# Patient Record
Sex: Female | Born: 1980 | Race: White | Hispanic: No | Marital: Married | Smoking: Never smoker
Health system: Southern US, Community
[De-identification: ages and names within clinical notes are randomized; demographics above are authoritative.]

## PROBLEM LIST (undated history)

## (undated) ENCOUNTER — Emergency Department (HOSPITAL_BASED_OUTPATIENT_CLINIC_OR_DEPARTMENT_OTHER): Admission: EM | Payer: BC Managed Care – PPO | Source: Home / Self Care

## (undated) DIAGNOSIS — Z9109 Other allergy status, other than to drugs and biological substances: Secondary | ICD-10-CM

## (undated) DIAGNOSIS — E119 Type 2 diabetes mellitus without complications: Secondary | ICD-10-CM

## (undated) DIAGNOSIS — K219 Gastro-esophageal reflux disease without esophagitis: Secondary | ICD-10-CM

## (undated) DIAGNOSIS — I10 Essential (primary) hypertension: Secondary | ICD-10-CM

## (undated) DIAGNOSIS — F411 Generalized anxiety disorder: Secondary | ICD-10-CM

## (undated) DIAGNOSIS — T7840XA Allergy, unspecified, initial encounter: Secondary | ICD-10-CM

## (undated) HISTORY — DX: Type 2 diabetes mellitus without complications: E11.9

## (undated) HISTORY — DX: Essential (primary) hypertension: I10

## (undated) HISTORY — DX: Gastro-esophageal reflux disease without esophagitis: K21.9

## (undated) HISTORY — DX: Allergy, unspecified, initial encounter: T78.40XA

## (undated) HISTORY — DX: Other allergy status, other than to drugs and biological substances: Z91.09

---
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
## 2005-04-05 HISTORY — PX: FRACTURE SURGERY: SHX138

## 2010-04-09 ENCOUNTER — Ambulatory Visit (HOSPITAL_COMMUNITY)
Admission: RE | Admit: 2010-04-09 | Discharge: 2010-04-09 | Payer: Self-pay | Source: Home / Self Care | Attending: Rheumatology | Admitting: Rheumatology

## 2010-10-01 ENCOUNTER — Ambulatory Visit (INDEPENDENT_AMBULATORY_CARE_PROVIDER_SITE_OTHER): Payer: BC Managed Care – PPO | Admitting: Family Medicine

## 2010-10-01 ENCOUNTER — Encounter: Payer: Self-pay | Admitting: Family Medicine

## 2010-10-01 DIAGNOSIS — M25561 Pain in right knee: Secondary | ICD-10-CM

## 2010-10-01 DIAGNOSIS — M25569 Pain in unspecified knee: Secondary | ICD-10-CM

## 2010-10-01 DIAGNOSIS — R269 Unspecified abnormalities of gait and mobility: Secondary | ICD-10-CM | POA: Insufficient documentation

## 2010-10-01 NOTE — Assessment & Plan Note (Signed)
Right knee pain secondary to patellofemoral syndrome - Continue to use chopat strap with activity - Instructed on HEP to improve quad/VMO strength - Sports insoles given to correct pes planus & over pronation which should help with knee pain - OTC NSAIDs prn - f/u prn

## 2010-10-01 NOTE — Assessment & Plan Note (Addendum)
Pes planus with overpronation likely contributing to knee pain - Fitted with sports insoles to help correct gait - f/u prn

## 2010-10-01 NOTE — Patient Instructions (Signed)
1) You have patello-femoral syndrome causing your knee pain. 2) Wear strap with activity & zumba 3) Wear new inserts in your shoes for added support 4) Use aleve 2 tabs twice a day with food 5) Do knee exercises on handout once a day 6) Follow-up as needed

## 2010-10-01 NOTE — Progress Notes (Signed)
  Subjective:    Patient ID: Amy Ball, female    DOB: Jun 01, 1980, 30 y.o.   MRN: [id number]  HPI 29yo female to office for evaluation of R knee pain.  Pain started 50-months ago, denies injury or trauma.  Pain along anterior knee & peri-patellar.  Worse with deep squats & stairs.  Intermittent swelling, no clicking, catching, locking, or instability.  Has been wearing OTC chopat strap which is helpful.  Using aleve prn which is helpful.  Is doing Zumba which does aggrevate pain.  Hx of Rt ankle ORIF 5-6 years ago for ankle fx.  No previous knee problems.  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  Rt knee with mild TTP along medial & lateral patellar facets, (+)patellar grind, no ligamentous laxity, neg McMurray, VMO weakness.  Lt knee without pain, swelling, tenderness, or weakness. - Foot/Ankles: Ankles FROM bilaterally without pain, weakness, or instability.  Bilateral pes planus with over pronation, normal posterior tib function - Gait: no leg length difference, increased dynamic over pronation NEURO: sensation intact to light touch VASC: pulses +2/4 DP & PT b/l  MKS U/S: Rt knee with normal appearing QT, PT, medial/lateral meniscus.  Normal appearing patella.  No effusion.  Images saved.       Assessment & Plan:

## 2015-12-19 ENCOUNTER — Ambulatory Visit: Payer: BC Managed Care – PPO | Admitting: Nutrition

## 2016-02-12 ENCOUNTER — Ambulatory Visit: Payer: BC Managed Care – PPO | Admitting: Nutrition

## 2016-03-08 ENCOUNTER — Ambulatory Visit: Payer: BC Managed Care – PPO | Admitting: Nutrition

## 2016-04-12 ENCOUNTER — Ambulatory Visit: Payer: BC Managed Care – PPO | Admitting: Nutrition

## 2016-04-20 ENCOUNTER — Ambulatory Visit: Payer: BC Managed Care – PPO | Admitting: Nutrition

## 2017-01-24 ENCOUNTER — Ambulatory Visit: Payer: BC Managed Care – PPO | Admitting: Family Medicine

## 2017-01-27 ENCOUNTER — Ambulatory Visit: Payer: BC Managed Care – PPO | Admitting: Family Medicine

## 2017-02-15 ENCOUNTER — Encounter: Payer: Self-pay | Admitting: Family Medicine

## 2017-02-15 ENCOUNTER — Ambulatory Visit (INDEPENDENT_AMBULATORY_CARE_PROVIDER_SITE_OTHER): Payer: BC Managed Care – PPO | Admitting: Family Medicine

## 2017-02-15 ENCOUNTER — Other Ambulatory Visit: Payer: Self-pay

## 2017-02-15 VITALS — BP 124/78 | HR 92 | Temp 98.2°F | Resp 16 | Ht 65.0 in | Wt 245.1 lb

## 2017-02-15 DIAGNOSIS — E119 Type 2 diabetes mellitus without complications: Secondary | ICD-10-CM | POA: Insufficient documentation

## 2017-02-15 DIAGNOSIS — L409 Psoriasis, unspecified: Secondary | ICD-10-CM | POA: Diagnosis not present

## 2017-02-15 DIAGNOSIS — I152 Hypertension secondary to endocrine disorders: Secondary | ICD-10-CM | POA: Insufficient documentation

## 2017-02-15 DIAGNOSIS — E1159 Type 2 diabetes mellitus with other circulatory complications: Secondary | ICD-10-CM | POA: Insufficient documentation

## 2017-02-15 DIAGNOSIS — Z9109 Other allergy status, other than to drugs and biological substances: Secondary | ICD-10-CM | POA: Diagnosis not present

## 2017-02-15 DIAGNOSIS — B372 Candidiasis of skin and nail: Secondary | ICD-10-CM

## 2017-02-15 DIAGNOSIS — I10 Essential (primary) hypertension: Secondary | ICD-10-CM | POA: Diagnosis not present

## 2017-02-15 MED ORDER — METFORMIN HCL 1000 MG PO TABS: 1000.0000 mg | ORAL_TABLET | Freq: Two times a day (BID) | ORAL | 3 refills | Status: DC

## 2017-02-15 MED ORDER — FLUCONAZOLE 150 MG PO TABS: 150.0000 mg | ORAL_TABLET | Freq: Once | ORAL | 0 refills | Status: AC

## 2017-02-15 MED ORDER — NYSTATIN 100000 UNIT/GM EX POWD: Freq: Four times a day (QID) | CUTANEOUS | 0 refills | Status: DC

## 2017-02-15 MED ORDER — LISINOPRIL 10 MG PO TABS: 10.0000 mg | ORAL_TABLET | Freq: Every day | ORAL | 3 refills | Status: DC

## 2017-02-15 MED ORDER — CLOBETASOL PROPIONATE 0.05 % EX SOLN: 1.0000 "application " | Freq: Two times a day (BID) | CUTANEOUS | 0 refills | Status: DC

## 2017-02-15 MED ORDER — NYSTATIN 100000 UNIT/GM EX CREA: 1.0000 "application " | TOPICAL_CREAM | Freq: Two times a day (BID) | CUTANEOUS | 0 refills | Status: DC

## 2017-02-15 MED ORDER — FLUTICASONE PROPIONATE 50 MCG/ACT NA SUSP: 1.0000 | Freq: Every day | NASAL | 3 refills | Status: DC

## 2017-02-15 NOTE — Patient Instructions (Signed)
Clobetasol scalp treatment ordered  Use the nystatin cream for the rash Once it clears may use the powder twice a day to prevent recurrence Take the diflucan   No change in medicines  Referred to Allergy in January  Need most recent note and labs from endocrinologist Need old records Dr Scotty Court Get pneumonia shot next time  See me end of Jan

## 2017-02-15 NOTE — Progress Notes (Signed)
Chief Complaint  Patient presents with  . Diabetes   This is a new patient to establish.  Her old doctor has retired. She is engaged to be married.  Her new husband will work for [employer].  The wedding is December 8. She has diabetes for the last 10 years.  She is under the care of endocrinology.  She states her A1c has been good. She has hypertension.  She is on lisinopril.  Her blood pressure control is good. She has never been told she has hyperlipidemia or needs to take a statin.  There is heart disease in her family.  I told her to discuss this with her endocrinologist. She has environmental allergies.  They bother her frequently.  She had allergy testing when she was younger.  She would like to see an allergy specialist. She has psoriasis.  It is mostly on her scalp.  It is untreated.  It is flaky.  She wants it improved for her wedding.  I am going to give her a prescription for a scalp steroid preparation. She also has a rash under her breasts.  She thinks it might be psoriasis.  On examination this is a Candida intertrigo.  We discussed management of this condition. She exercises regularly.  She is obese.  When she comes back we will discuss a visit to a nutritionist/diabetes educator for assistance. She is up-to-date with health screening, Pap smear. She recently had a flu shot. She is uncertain regarding her last tetanus shot. She does get yearly eye exams. She has never had a pneumonia vaccination.  She does not want to get it today.  She promises to get it next visit.   Patient Active Problem List   Diagnosis Date Noted  . Obesity, Class III, BMI 40-49.9 (morbid obesity) ([facility]) 02/15/2017  . Controlled type 2 diabetes mellitus without complication, without long-term current use of insulin ([place]) 02/15/2017  . Essential hypertension 02/15/2017  . Environmental allergies 02/15/2017  . Knee pain, right 10/01/2010  . Gait abnormality 10/01/2010    Outpatient Encounter  Medications as of 02/15/2017  Medication Sig  . Ascorbic Acid (VITAMIN C) 1000 MG tablet Take 1,000 mg by mouth daily.    . fexofenadine (ALLEGRA) 180 MG tablet Take 180 mg by mouth daily.    . fluticasone (FLONASE) 50 MCG/ACT nasal spray Place 1 spray daily into both nostrils.  Marland Kitchen JARDIANCE 25 MG TABS tablet Take 25 mg daily by mouth.  . Liraglutide (VICTOZA [place]) Inject 1.8 mLs daily into the skin.   Marland Kitchen lisinopril (PRINIVIL,ZESTRIL) 10 MG tablet Take 1 tablet (10 mg total) daily by mouth.  . metFORMIN (GLUCOPHAGE) 1000 MG tablet Take 1 tablet (1,000 mg total) 2 (two) times daily with a meal by mouth.  Marland Kitchen NOVOFINE 32G X 6 MM MISC   . clobetasol (CORMAX SCALP APPLICATION) 2.59 % external solution Apply 1 application 2 (two) times daily topically.  . fluconazole (DIFLUCAN) 150 MG tablet Take 1 tablet (150 mg total) once for 1 dose by mouth.  . nystatin (NYSTATIN) powder Apply 4 (four) times daily topically.  . nystatin cream (MYCOSTATIN) Apply 1 application 2 (two) times daily topically.   No facility-administered encounter medications on file as of 02/15/2017.     Past Medical History:  Diagnosis Date  . Allergy    environ allergies  . Diabetes mellitus without complication ([place])    10 years  . GERD (gastroesophageal reflux disease)   . Hypertension     Past Surgical  History:  Procedure Laterality Date  . FRACTURE SURGERY  2007   right ankle    Social History   Socioeconomic History  . Marital status: Significant Other    Spouse name: Hunter  . Number of children: Not on file  . Years of education: 77  . Highest education level: Master's degree (e.g., MA, MS, MEng, MEd, MSW, MBA)  Social Needs  . Financial resource strain: Not hard at all  . Food insecurity - worry: Never true  . Food insecurity - inability: Never true  . Transportation needs - medical: No  . Transportation needs - non-medical: No  Occupational History  . Occupation: third grade teacher  Tobacco Use  .  Smoking status: Never Smoker  . Smokeless tobacco: Never Used  Substance and Sexual Activity  . Alcohol use: Yes    Frequency: Never  . Drug use: No  . Sexual activity: Yes  Other Topics Concern  . Not on file  Social History Narrative   Engaged to be married December 8 to [place]    Family History  Problem Relation Age of Onset  . Birth defects Mother        one hand small  . Arthritis Father   . Diabetes Father   . Heart disease Father 52       heart attack  . Psoriasis Father   . Psoriasis Brother   . Stroke Paternal Grandmother        age 21  . Cancer Paternal Grandfather     Review of Systems  Constitutional: Negative for chills, fever and weight loss.  HENT: Negative for congestion and hearing loss.   Eyes: Negative for blurred vision and pain.  Respiratory: Negative for cough and shortness of breath.   Cardiovascular: Negative for chest pain and leg swelling.  Gastrointestinal: Negative for abdominal pain, constipation, diarrhea and heartburn.  Genitourinary: Negative for dysuria and frequency.  Musculoskeletal: Negative for falls, joint pain and myalgias.  Skin: Positive for rash.  Neurological: Negative for dizziness, seizures and headaches.  Psychiatric/Behavioral: Negative for depression. The patient is not nervous/anxious and does not have insomnia.     BP 124/78 (BP Location: Right Arm, Patient Position: Sitting, Cuff Size: Large)   Pulse 92   Temp 98.2 F (36.8 C) (Temporal)   Resp 16   Ht 5\' 5"  (1.651 m)   Wt 245 lb 1.9 oz (111.2 kg)   LMP 02/01/2017 (Exact Date)   SpO2 98%   BMI 40.79 kg/m   Physical Exam  Constitutional: She is oriented to person, place, and time. She appears well-developed and well-nourished.  Pleasant,  articulate.  HENT:  Head: Normocephalic and atraumatic.  Mouth/Throat: Oropharynx is clear and moist.  Eyes: Conjunctivae are normal. Pupils are equal, round, and reactive to light.  Neck: Normal range of motion. Neck  supple.  Cardiovascular: Normal rate, regular rhythm and normal heart sounds.  Pulmonary/Chest: Effort normal and breath sounds normal.  Genitourinary: No vaginal discharge found.  Musculoskeletal: Normal range of motion. She exhibits no edema.  Neurological: She is alert and oriented to person, place, and time.  Skin: Rash noted.  Candida under breasts.  Psychiatric: She has a normal mood and affect. Her behavior is normal. Thought content normal.   ASSESSMENT/PLAN:  1. Controlled type 2 diabetes mellitus without complication, without long-term current use of insulin ([place]) Under care of endocrinology.  We will request recent notes and blood work  2. Essential hypertension Controlled on lisinopril  3. Environmental allergies Patient  request refer to allergy - Ambulatory referral to Allergy  4. Obesity, Class III, BMI 40-49.9 (morbid obesity) ([facility]) Discussed diet and exercise.  We will focus more on this at future visits  5. Candidal intertrigo Treated  6. Psoriasis Treated   Patient Instructions  Clobetasol scalp treatment ordered  Use the nystatin cream for the rash Once it clears may use the powder twice a day to prevent recurrence Take the diflucan   No change in medicines  Referred to Allergy in January  Need most recent note and labs from endocrinologist Need old records Dr Scotty Court Get pneumonia shot next time  See me end of Jan   Raylene Everts, MD

## 2017-02-18 ENCOUNTER — Encounter: Payer: Self-pay | Admitting: Family Medicine

## 2017-02-20 ENCOUNTER — Encounter: Payer: Self-pay | Admitting: Family Medicine

## 2017-02-21 ENCOUNTER — Other Ambulatory Visit: Payer: Self-pay | Admitting: Family Medicine

## 2017-02-21 ENCOUNTER — Encounter: Payer: Self-pay | Admitting: Family Medicine

## 2017-02-21 MED ORDER — SULFAMETHOXAZOLE-TRIMETHOPRIM 800-160 MG PO TABS: 1.0000 | ORAL_TABLET | Freq: Two times a day (BID) | ORAL | 0 refills | Status: DC

## 2017-02-27 ENCOUNTER — Encounter: Payer: Self-pay | Admitting: Family Medicine

## 2017-03-01 ENCOUNTER — Encounter: Payer: Self-pay | Admitting: Family Medicine

## 2017-03-01 NOTE — Telephone Encounter (Signed)
Patient is calling to inquire about her medication that she has to carry with her to travel. Please see below message. Patient is requesting a call back. Her call back # is [phone].  Thank you.

## 2017-04-14 ENCOUNTER — Encounter: Payer: Self-pay | Admitting: Family Medicine

## 2017-04-18 ENCOUNTER — Telehealth: Payer: Self-pay | Admitting: Family Medicine

## 2017-04-18 NOTE — Telephone Encounter (Signed)
Called patient to r/s appt, no answer, left vm.  Scheduled for the closest availability.

## 2017-04-22 ENCOUNTER — Ambulatory Visit (INDEPENDENT_AMBULATORY_CARE_PROVIDER_SITE_OTHER): Payer: BC Managed Care – PPO | Admitting: Family Medicine

## 2017-04-22 ENCOUNTER — Encounter: Payer: Self-pay | Admitting: Family Medicine

## 2017-04-22 ENCOUNTER — Other Ambulatory Visit: Payer: Self-pay

## 2017-04-22 VITALS — BP 128/80 | HR 92 | Temp 98.3°F | Resp 18 | Ht 65.0 in | Wt 242.1 lb

## 2017-04-22 DIAGNOSIS — B372 Candidiasis of skin and nail: Secondary | ICD-10-CM | POA: Diagnosis not present

## 2017-04-22 DIAGNOSIS — J0101 Acute recurrent maxillary sinusitis: Secondary | ICD-10-CM | POA: Diagnosis not present

## 2017-04-22 DIAGNOSIS — Z9109 Other allergy status, other than to drugs and biological substances: Secondary | ICD-10-CM | POA: Diagnosis not present

## 2017-04-22 MED ORDER — SULFAMETHOXAZOLE-TRIMETHOPRIM 800-160 MG PO TABS: 1.0000 | ORAL_TABLET | Freq: Two times a day (BID) | ORAL | 1 refills | Status: DC

## 2017-04-22 MED ORDER — KETOCONAZOLE 2 % EX CREA: 1.0000 "application " | TOPICAL_CREAM | Freq: Every day | CUTANEOUS | 3 refills | Status: DC

## 2017-04-22 NOTE — Patient Instructions (Signed)
Use the humidifier Push fluids Take the antibiotic for 10 days Refill if needed Continue the allegra and flonase  Call if not better in a few days

## 2017-04-22 NOTE — Progress Notes (Signed)
Chief Complaint  Patient presents with  . Follow-up  Patient has environmental allergies.  She is compliant with her medication.  She states because of her allergies she is "prone" to sinus infection.  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  Currently she has had sinus pressure and pain, postnasal drip, sore throat, headache, and some coughing for going on 3 weeks.  She has been pushing fluids, she added Mucinex, she has been using her Flonase and her Allegra.  In spite of this she has continued sinus pressure and thick sinus drainage.  She does feel tired.  No fever or chills. When last seen she was treated for Candida intertrigo, rash under the breasts.  It is improved but not gone.  I am going to change her nystatin to ketoconazole and see if it helps.   Patient Active Problem List   Diagnosis Date Noted  . Obesity, Class III, BMI 40-49.9 (morbid obesity) ([place]) 02/15/2017  . Controlled type 2 diabetes mellitus without complication, without long-term current use of insulin ([place]) 02/15/2017  . Essential hypertension 02/15/2017  . Environmental allergies 02/15/2017  . Knee pain, right 10/01/2010  . Gait abnormality 10/01/2010    Outpatient Encounter Medications as of 04/22/2017  Medication Sig  . Ascorbic Acid (VITAMIN C) 1000 MG tablet Take 1,000 mg by mouth daily.    . clobetasol (CORMAX SCALP APPLICATION) 6.07 % external solution Apply 1 application 2 (two) times daily topically.  . fexofenadine (ALLEGRA) 180 MG tablet Take 180 mg by mouth daily.    . fluticasone (FLONASE) 50 MCG/ACT nasal spray Place 1 spray daily into both nostrils.  Marland Kitchen JARDIANCE 25 MG TABS tablet Take 25 mg daily by mouth.  . Liraglutide (VICTOZA [place]) Inject 1.8 mLs daily into the skin.   Marland Kitchen lisinopril (PRINIVIL,ZESTRIL) 10 MG tablet Take 1 tablet (10 mg total) daily by mouth.  . metFORMIN (GLUCOPHAGE) 1000 MG tablet Take 1 tablet (1,000 mg total) 2  (two) times daily with a meal by mouth.  Marland Kitchen NOVOFINE 32G X 6 MM MISC   . nystatin (NYSTATIN) powder Apply 4 (four) times daily topically.  . nystatin cream (MYCOSTATIN) Apply 1 application 2 (two) times daily topically.  Marland Kitchen ketoconazole (NIZORAL) 2 % cream Apply 1 application topically daily.  Marland Kitchen sulfamethoxazole-trimethoprim (BACTRIM DS,SEPTRA DS) 800-160 MG tablet Take 1 tablet by mouth 2 (two) times daily.  . [DISCONTINUED] sulfamethoxazole-trimethoprim (BACTRIM DS,SEPTRA DS) 800-160 MG tablet Take 1 tablet 2 (two) times daily by mouth. (Patient not taking: Reported on 04/22/2017)   No facility-administered encounter medications on file as of 04/22/2017.     Allergies  Allergen Reactions  . Amoxicillin-Pot Clavulanate Anaphylaxis  . Avelox [Moxifloxacin Hcl In Nacl] Rash    Review of Systems  Constitutional: Positive for fatigue. Negative for activity change, appetite change and unexpected weight change.  HENT: Positive for congestion, postnasal drip, rhinorrhea, sinus pressure, sinus pain and sore throat. Negative for dental problem.   Eyes: Negative for redness and visual disturbance.  Respiratory: Positive for cough. Negative for shortness of breath.   Cardiovascular: Negative for chest pain, palpitations and leg swelling.  Gastrointestinal: Negative for abdominal pain, constipation and diarrhea.  Genitourinary: Negative for difficulty urinating, frequency and menstrual problem.  Musculoskeletal: Negative for arthralgias and back pain.  Skin: Positive for rash.  Neurological: Negative for dizziness and headaches.  Psychiatric/Behavioral: Negative for dysphoric mood and sleep disturbance. The patient is not nervous/anxious.  BP 128/80 (BP Location: Right Arm, Patient Position: Sitting, Cuff Size: Normal)   Pulse 92   Temp 98.3 F (36.8 C) (Temporal)   Resp 18   Ht 5\' 5"  (1.651 m)   Wt 242 lb 1.9 oz (109.8 kg)   SpO2 99%   BMI 40.29 kg/m   Physical Exam    Constitutional: She is oriented to person, place, and time. She appears well-developed and well-nourished.  Pleasant,  articulate.  Overweight  HENT:  Head: Normocephalic and atraumatic.  Right Ear: External ear normal.  Left Ear: External ear normal.  Mouth/Throat: Oropharynx is clear and moist.  Sinuses are tender to touch maxillary and ethmoid.  Patient has nasal congestion, erythematous nasal membranes.  Posterior pharynx mildly injected.  Yellow PND  Eyes: Conjunctivae are normal. Pupils are equal, round, and reactive to light.  Neck: Normal range of motion. Neck supple.  Cardiovascular: Normal rate, regular rhythm and normal heart sounds.  Pulmonary/Chest: Effort normal and breath sounds normal.  Genitourinary: No vaginal discharge found.  Musculoskeletal: Normal range of motion. She exhibits no edema.  Neurological: She is alert and oriented to person, place, and time.  Skin: Rash noted.  Candida under breasts.  Psychiatric: She has a normal mood and affect. Her behavior is normal. Thought content normal.    ASSESSMENT/PLAN:  1. Acute recurrent maxillary sinusitis We will treat with antibiotics because of the duration of the symptoms.  2. Candidal intertrigo Ketoconazole  3. Environmental allergies Continue Flonase and Allegra   Patient Instructions  Use the humidifier Push fluids Take the antibiotic for 10 days Refill if needed Continue the allegra and flonase  Call if not better in a few days   Amy Everts, MD

## 2017-04-25 ENCOUNTER — Ambulatory Visit: Payer: BC Managed Care – PPO | Admitting: Family Medicine

## 2017-04-28 ENCOUNTER — Ambulatory Visit: Payer: BC Managed Care – PPO | Admitting: Family Medicine

## 2017-05-11 ENCOUNTER — Encounter: Payer: Self-pay | Admitting: Family Medicine

## 2017-05-11 ENCOUNTER — Ambulatory Visit: Payer: BC Managed Care – PPO | Admitting: Family Medicine

## 2017-05-11 ENCOUNTER — Other Ambulatory Visit: Payer: Self-pay

## 2017-05-11 VITALS — BP 136/76 | HR 104 | Temp 100.4°F | Resp 24 | Ht 65.0 in | Wt 247.1 lb

## 2017-05-11 DIAGNOSIS — J111 Influenza due to unidentified influenza virus with other respiratory manifestations: Secondary | ICD-10-CM | POA: Diagnosis not present

## 2017-05-11 LAB — POCT INFLUENZA A/B
Influenza A, POC: POSITIVE — AB
Influenza B, POC: NEGATIVE

## 2017-05-11 MED ORDER — OSELTAMIVIR PHOSPHATE 75 MG PO CAPS: 75.0000 mg | ORAL_CAPSULE | Freq: Two times a day (BID) | ORAL | 0 refills | Status: DC

## 2017-05-11 NOTE — Progress Notes (Signed)
Chief Complaint  Patient presents with  . URI    fever x 2 days   Sudden onset yesterday of cough cold runny nose sore throat and fever with profound weakness and body ache.  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  She did get a flu shot this year.  He is able to keep down fluids.  She is been taking over-the-counter medications.  Patient Active Problem List   Diagnosis Date Noted  . Obesity, Class III, BMI 40-49.9 (morbid obesity) ([place]) 02/15/2017  . Controlled type 2 diabetes mellitus without complication, without long-term current use of insulin ([place]) 02/15/2017  . Essential hypertension 02/15/2017  . Environmental allergies 02/15/2017  . Knee pain, right 10/01/2010  . Gait abnormality 10/01/2010    Outpatient Encounter Medications as of 05/11/2017  Medication Sig  . Ascorbic Acid (VITAMIN C) 1000 MG tablet Take 1,000 mg by mouth daily.    . clobetasol (CORMAX SCALP APPLICATION) 1.61 % external solution Apply 1 application 2 (two) times daily topically.  . fexofenadine (ALLEGRA) 180 MG tablet Take 180 mg by mouth daily.    . fluticasone (FLONASE) 50 MCG/ACT nasal spray Place 1 spray daily into both nostrils.  Marland Kitchen JARDIANCE 25 MG TABS tablet Take 25 mg daily by mouth.  Marland Kitchen ketoconazole (NIZORAL) 2 % cream Apply 1 application topically daily.  . Liraglutide (VICTOZA [place]) Inject 1.8 mLs daily into the skin.   Marland Kitchen lisinopril (PRINIVIL,ZESTRIL) 10 MG tablet Take 1 tablet (10 mg total) daily by mouth.  . metFORMIN (GLUCOPHAGE) 1000 MG tablet Take 1 tablet (1,000 mg total) 2 (two) times daily with a meal by mouth.  Marland Kitchen NOVOFINE 32G X 6 MM MISC   . nystatin (NYSTATIN) powder Apply 4 (four) times daily topically.  . nystatin cream (MYCOSTATIN) Apply 1 application 2 (two) times daily topically.  Marland Kitchen oseltamivir (TAMIFLU) 75 MG capsule Take 1 capsule (75 mg total) by mouth 2 (two) times daily.  . [DISCONTINUED] sulfamethoxazole-trimethoprim (BACTRIM DS,SEPTRA DS) 800-160 MG  tablet Take 1 tablet by mouth 2 (two) times daily. (Patient not taking: Reported on 05/11/2017)   No facility-administered encounter medications on file as of 05/11/2017.     Allergies  Allergen Reactions  . Amoxicillin-Pot Clavulanate Anaphylaxis  . Avelox [Moxifloxacin Hcl In Nacl] Rash    Review of Systems  Constitutional: Positive for activity change, appetite change, chills, fatigue and fever.  HENT: Positive for congestion, postnasal drip, rhinorrhea and sore throat.   Eyes: Negative for redness and visual disturbance.  Respiratory: Positive for cough. Negative for shortness of breath and wheezing.   Cardiovascular: Negative for chest pain and palpitations.  Gastrointestinal: Negative for diarrhea, nausea and vomiting.  Genitourinary: Negative for dysuria and frequency.  Musculoskeletal: Positive for myalgias. Negative for arthralgias.  Skin: Negative for rash.  Neurological: Positive for headaches. Negative for dizziness.  Psychiatric/Behavioral: Negative.     BP 136/76 (BP Location: Left Arm, Patient Position: Sitting, Cuff Size: Large)   Pulse (!) 104   Temp (!) 100.4 F (38 C) (Temporal)   Resp (!) 24   Ht 5\' 5"  (1.651 m)   Wt 247 lb 1.9 oz (112.1 kg)   SpO2 97%   BMI 41.12 kg/m   Physical Exam  Constitutional: She is oriented to person, place, and time. She appears well-developed and well-nourished. She appears distressed.  Appears tired, moderately ill  HENT:  Head: Normocephalic and atraumatic.  Right Ear: External ear normal.  Left Ear: External ear normal.  Nose: Nose normal.  Mouth/Throat: Oropharynx is clear and moist.  Mild posterior pharyngeal injection with no exudate  Eyes: Conjunctivae and EOM are normal. Pupils are equal, round, and reactive to light.  Neck: Normal range of motion. No thyromegaly present.  Cardiovascular: Normal rate, regular rhythm and normal heart sounds.  Pulmonary/Chest: Effort normal and breath sounds normal.  Abdominal: Soft.  Bowel sounds are normal.  No tenderness, no organomegaly  Lymphadenopathy:    She has no cervical adenopathy.  Neurological: She is alert and oriented to person, place, and time.  Psychiatric: She has a normal mood and affect. Her behavior is normal. Thought content normal.    ASSESSMENT/PLAN:  1. Influenza  - POCT Influenza A/B Results for orders placed or performed in visit on 05/11/17  POCT Influenza A/B  Result Value Ref Range   Influenza A, POC Positive (A) Negative   Influenza B, POC Negative Negative     Patient Instructions  Off work until at least Monday Take the tamiflu as directed Take 2 doses today Call for problems   Raylene Everts, MD

## 2017-05-11 NOTE — Patient Instructions (Signed)
Off work until at least Monday Take the tamiflu as directed Take 2 doses today Call for problems

## 2017-06-06 ENCOUNTER — Other Ambulatory Visit: Payer: Self-pay | Admitting: Family Medicine

## 2017-06-27 ENCOUNTER — Encounter: Payer: Self-pay | Admitting: Family Medicine

## 2017-06-27 LAB — HM DIABETES EYE EXAM

## 2017-07-06 ENCOUNTER — Other Ambulatory Visit: Payer: Self-pay | Admitting: Family Medicine

## 2017-07-18 ENCOUNTER — Encounter: Payer: Self-pay | Admitting: Family Medicine

## 2017-07-19 ENCOUNTER — Other Ambulatory Visit: Payer: Self-pay | Admitting: Family Medicine

## 2017-07-19 MED ORDER — OMEPRAZOLE 20 MG PO CPDR: 20.0000 mg | DELAYED_RELEASE_CAPSULE | Freq: Every day | ORAL | 1 refills | Status: DC

## 2017-08-14 ENCOUNTER — Other Ambulatory Visit: Payer: Self-pay | Admitting: Family Medicine

## 2017-08-18 ENCOUNTER — Other Ambulatory Visit: Payer: Self-pay | Admitting: Family Medicine

## 2017-08-31 ENCOUNTER — Encounter: Payer: Self-pay | Admitting: Family Medicine

## 2017-09-04 ENCOUNTER — Other Ambulatory Visit: Payer: Self-pay | Admitting: Family Medicine

## 2017-09-05 ENCOUNTER — Telehealth: Payer: Self-pay | Admitting: Family Medicine

## 2017-09-05 NOTE — Telephone Encounter (Signed)
Can this patient's Flonase be refilled? She has been trying since last week to get it refilled. She is a former Dr.Nelson patient.

## 2017-09-05 NOTE — Telephone Encounter (Signed)
Pt needs a Refill of Flonase--I see you have been going back and forth with her on My chart--she is fustrated. Can you suggest something else

## 2017-09-06 ENCOUNTER — Other Ambulatory Visit: Payer: Self-pay

## 2017-09-06 MED ORDER — FLUTICASONE PROPIONATE 50 MCG/ACT NA SUSP: 1.0000 | Freq: Every day | NASAL | 3 refills | Status: DC

## 2017-09-06 NOTE — Telephone Encounter (Signed)
Please send in to pharmacy.  Flonase nasal spray, 1 spray, each nostril daily #1 with 3 refills.

## 2017-09-06 NOTE — Telephone Encounter (Signed)
Patient's Flonase refilled and patient notified with verbal understanding.

## 2017-09-09 ENCOUNTER — Encounter: Payer: Self-pay | Admitting: Family Medicine

## 2018-02-20 ENCOUNTER — Encounter: Payer: Self-pay | Admitting: Family Medicine

## 2018-02-20 ENCOUNTER — Ambulatory Visit: Payer: BC Managed Care – PPO | Admitting: Family Medicine

## 2018-02-20 VITALS — BP 124/80 | HR 99 | Temp 98.1°F | Ht 65.0 in | Wt 252.0 lb

## 2018-02-20 DIAGNOSIS — I152 Hypertension secondary to endocrine disorders: Secondary | ICD-10-CM

## 2018-02-20 DIAGNOSIS — Z789 Other specified health status: Secondary | ICD-10-CM | POA: Diagnosis not present

## 2018-02-20 DIAGNOSIS — Z7689 Persons encountering health services in other specified circumstances: Secondary | ICD-10-CM | POA: Diagnosis not present

## 2018-02-20 DIAGNOSIS — K219 Gastro-esophageal reflux disease without esophagitis: Secondary | ICD-10-CM | POA: Diagnosis not present

## 2018-02-20 DIAGNOSIS — I10 Essential (primary) hypertension: Secondary | ICD-10-CM

## 2018-02-20 DIAGNOSIS — B372 Candidiasis of skin and nail: Secondary | ICD-10-CM | POA: Diagnosis not present

## 2018-02-20 DIAGNOSIS — E1159 Type 2 diabetes mellitus with other circulatory complications: Secondary | ICD-10-CM

## 2018-02-20 DIAGNOSIS — E119 Type 2 diabetes mellitus without complications: Secondary | ICD-10-CM

## 2018-02-20 MED ORDER — FLUTICASONE PROPIONATE 50 MCG/ACT NA SUSP: 1.0000 | Freq: Every day | NASAL | 3 refills | Status: DC

## 2018-02-20 MED ORDER — NYSTATIN 100000 UNIT/GM EX CREA: 1.0000 "application " | TOPICAL_CREAM | Freq: Two times a day (BID) | CUTANEOUS | 0 refills | Status: DC

## 2018-02-20 MED ORDER — NYSTATIN 100000 UNIT/GM EX POWD: Freq: Three times a day (TID) | CUTANEOUS | 0 refills | Status: DC

## 2018-02-20 MED ORDER — PANTOPRAZOLE SODIUM 20 MG PO TBEC: 20.0000 mg | DELAYED_RELEASE_TABLET | Freq: Every day | ORAL | 1 refills | Status: DC

## 2018-02-20 NOTE — Patient Instructions (Addendum)
See me after the new year for your physical exam.  We will check fasting labs and get you caught up on recommendations for your diabetes at that visit.  Pantoprazole is okay to continue during pregnancy if needed.  Your goal blood pressure is 120s/80s.  If your blood pressure goes above 140/90, please contact the office.  This would be an indication to start medication again.  Start a prenatal vitamin daily.   How to Take Your Blood Pressure You can take your blood pressure at home with a machine. You may need to check your blood pressure at home:  To check if you have high blood pressure (hypertension).  To check your blood pressure over time.  To make sure your blood pressure medicine is working.  Supplies needed: You will need a blood pressure machine, or monitor. You can buy one at a drugstore or online. When choosing one:  Choose one with an arm cuff.  Choose one that wraps around your upper arm. Only one finger should fit between your arm and the cuff.  Do not choose one that measures your blood pressure from your wrist or finger.  Your doctor can suggest a monitor. How to prepare Avoid these things for 30 minutes before checking your blood pressure:  Drinking caffeine.  Drinking alcohol.  Eating.  Smoking.  Exercising.  Five minutes before checking your blood pressure:  Pee.  Sit in a dining chair. Avoid sitting in a soft couch or armchair.  Be quiet. Do not talk.  How to take your blood pressure Follow the instructions that came with your machine. If you have a digital blood pressure monitor, these may be the instructions: 1. Sit up straight. 2. Place your feet on the floor. Do not cross your ankles or legs. 3. Rest your left arm at the level of your heart. You may rest it on a table, desk, or chair. 4. Pull up your shirt sleeve. 5. Wrap the blood pressure cuff around the upper part of your left arm. The cuff should be 1 inch (2.5 cm) above your elbow.  It is best to wrap the cuff around bare skin. 6. Fit the cuff snugly around your arm. You should be able to place only one finger between the cuff and your arm. 7. Put the cord inside the groove of your elbow. 8. Press the power button. 9. Sit quietly while the cuff fills with air and loses air. 10. Write down the numbers on the screen. 11. Wait 2-3 minutes and then repeat steps 1-10.  What do the numbers mean? Two numbers make up your blood pressure. The first number is called systolic pressure. The second is called diastolic pressure. An example of a blood pressure reading is "120 over 80" (or 120/80). If you are an adult and do not have a medical condition, use this guide to find out if your blood pressure is normal: Normal  First number: below 120.  Second number: below 80. Elevated  First number: 120-129.  Second number: below 80. Hypertension stage 1  First number: 130-139.  Second number: 80-89. Hypertension stage 2  First number: 140 or above.  Second number: 46 or above. Your blood pressure is above normal even if only the top or bottom number is above normal. Follow these instructions at home:  Check your blood pressure as often as your doctor tells you to.  Take your monitor to your next doctor's appointment. Your doctor will: ? Make sure you are using it  correctly. ? Make sure it is working right.  Make sure you understand what your blood pressure numbers should be.  Tell your doctor if your medicines are causing side effects. Contact a doctor if:  Your blood pressure keeps being high. Get help right away if:  Your first blood pressure number is higher than 180.  Your second blood pressure number is higher than 120. This information is not intended to replace advice given to you by your health care provider. Make sure you discuss any questions you have with your health care provider. Document Released: 03/04/2008 Document Revised: 02/18/2016 Document  Reviewed: 08/29/2015 Elsevier Interactive Patient Education  [employer].

## 2018-02-20 NOTE — Progress Notes (Signed)
Subjective: UY:[id number] care, rash under breasts HPI: Amy Ball is a 37 y.o. female presenting to clinic today for:  1.  Rash Patient reports a rash under the breast which has developed over the last several days.  She notes she had a similar one last year that was treated with nystatin cream and powder and a Diflucan tablet.  This seemed to resolve her symptoms.  She describes it is somewhat itchy but it seems to be peeling.  She does have a history of psoriasis but states that it does not quite look like her normal psoriatic flares.  2.  GERD Patient reports long-standing history of acid reflux, recently treated with omeprazole.  She notes good control with omeprazole but is switching over to her husband's insurance and wants to try and get on pantoprazole, which is better covered.  Denies any acid reflux symptoms currently.  No nausea, vomiting, melena or hematochezia.  No abdominal pain.  3.  Attempting to conceive/type 2 diabetes/ HTN Patient reports that she is working on conceiving.  She just saw her endocrinologist, Dr. Buddy Duty, today and he took her off of many of her medications and is transitioning her over to metformin and insulin.  Her insulin dose is to be determined, pending her A1c from today.  He took her off of her lisinopril.  She is to continue monitoring blood pressures closely at home.  No replacement blood pressure medication was started.  She does not have an OB/GYN but is interested in establishing with one within the [facility].  She also has not had a Pap smear up-to-date.  Last mental cycle was 01/31/2018.  No chest pain, shortness of breath.    Past Medical History:  Diagnosis Date  . Allergy    environ allergies  . Diabetes mellitus without complication ([place])    10 years  . GERD (gastroesophageal reflux disease)   . Hypertension    Past Surgical History:  Procedure Laterality Date  . FRACTURE SURGERY  2007   right ankle   Social History    Socioeconomic History  . Marital status: Married    Spouse name: Yong Channel  . Number of children: Not on file  . Years of education: 30  . Highest education level: Master's degree (e.g., MA, MS, MEng, MEd, MSW, MBA)  Occupational History  . Occupation: third grade teacher  Social Needs  . Financial resource strain: Not hard at all  . Food insecurity:    Worry: Never true    Inability: Never true  . Transportation needs:    Medical: No    Non-medical: No  Tobacco Use  . Smoking status: Never Smoker  . Smokeless tobacco: Never Used  Substance and Sexual Activity  . Alcohol use: Yes    Frequency: Never  . Drug use: No  . Sexual activity: Yes  Lifestyle  . Physical activity:    Days per week: 6 days    Minutes per session: 40 min  . Stress: Only a little  Relationships  . Social connections:    Talks on phone: More than three times a week    Gets together: More than three times a week    Attends religious service: More than 4 times per year    Active member of club or organization: No    Attends meetings of clubs or organizations: Never    Relationship status: Married  . Intimate partner violence:    Fear of current or ex partner: No    Emotionally  abused: No    Physically abused: No    Forced sexual activity: No  Other Topics Concern  . Not on file  Social History Narrative   Engaged to be married December 8 to [place]   Current Meds  Medication Sig  . Ascorbic Acid (VITAMIN C) 1000 MG tablet Take 1,000 mg by mouth daily.    . clobetasol (CORMAX SCALP APPLICATION) 1.75 % external solution Apply 1 application 2 (two) times daily topically.  . fexofenadine (ALLEGRA) 180 MG tablet Take 180 mg by mouth daily.    . fluticasone (FLONASE) 50 MCG/ACT nasal spray Place 1 spray into both nostrils daily.  . metFORMIN (GLUCOPHAGE) 1000 MG tablet TAKE 1 TABLET (1,000 MG TOTAL) 2 (TWO) TIMES DAILY WITH A MEAL BY MOUTH.  Marland Kitchen NOVOFINE 32G X 6 MM MISC   . nystatin (NYSTATIN) powder  Apply topically 3 (three) times daily.  Marland Kitchen nystatin cream (MYCOSTATIN) Apply 1 application topically 2 (two) times daily.  . vitamin B-12 (CYANOCOBALAMIN) 1000 MCG tablet Take 1,000 mcg by mouth daily.  . [DISCONTINUED] fluticasone (FLONASE) 50 MCG/ACT nasal spray Place 1 spray into both nostrils daily.  . [DISCONTINUED] nystatin (NYSTATIN) powder Apply 4 (four) times daily topically.  . [DISCONTINUED] nystatin cream (MYCOSTATIN) Apply 1 application 2 (two) times daily topically.   Family History  Problem Relation Age of Onset  . Birth defects Mother        one hand small  . Arthritis Father   . Diabetes Father   . Heart disease Father 42       heart attack  . Psoriasis Father   . Psoriasis Brother   . Stroke Paternal Grandmother        age 1  . Cancer Paternal Grandfather    Allergies  Allergen Reactions  . Amoxicillin-Pot Clavulanate Anaphylaxis  . Avelox [Moxifloxacin Hcl In Nacl] Rash     Health Maintenance: PNA vaccine ROS: Per HPI  Objective: Office vital signs reviewed. BP 124/80   Pulse 99   Temp 98.1 F (36.7 C) (Other (Comment))   Ht 5\' 5"  (1.651 m)   Wt 252 lb (114.3 kg)   BMI 41.93 kg/m   Physical Examination:  General: Awake, alert, well nourished, well appearing. No acute distress HEENT: Normal, sclera white, MMM Cardio: regular rate and rhythm, S1S2 heard, no murmurs appreciated Pulm: clear to auscultation bilaterally, no wheezes, rhonchi or rales; normal work of breathing on room air Extremities: warm, well perfused, No edema, cyanosis or clubbing; +2 pulses bilaterally MSK: normal gait and station Skin: dry; mildly erythematous, macerated area underneath bilateral breast near the medial aspects of the chest.  No appreciable exudate or bleeding. Psych: Mood stable, speech normal, affect appropriate, pleasant and interactive.  Depression screen [facility] 2/9 02/20/2018 04/22/2017 02/15/2017  Decreased Interest 0 0 0  Down, Depressed, Hopeless 0 0 0  PHQ  - 2 Score 0 0 0  Altered sleeping 0 - -  Tired, decreased energy 0 - -  Change in appetite 0 - -  Feeling bad or failure about yourself  0 - -  Trouble concentrating 0 - -  Moving slowly or fidgety/restless 0 - -  Suicidal thoughts 0 - -  PHQ-9 Score 0 - -  Difficult doing work/chores Not difficult at all - -   Assessment/ Plan: 37 y.o. female   1. Intertriginous candidiasis Physical exam consistent with candidal intertrigo.  I have prescribed her nystatin cream and nystatin powder.  If symptoms do not improve with topical  treatment after 1 week, she will contact the office and I will send in [employer].  If she is trying to conceive, Diflucan is not ideal therapy.  No evidence of secondary bacterial infection  2. Establishing care with new doctor, encounter for Release of information form completed to obtain records from Dr. Buddy Duty.  3. Gastroesophageal reflux disease without esophagitis Controlled on current PPI.  Will transition over to pantoprazole 20 mg daily for insurance purposes.  4. Attempting to conceive Referral to OB/GYN.  She will need updates on Pap smears as well.  If she does not secure an appointment by the first of the year, we will plan a Pap smear during her annual physical with me next year. - Ambulatory referral to Obstetrics / Gynecology  5. Controlled type 2 diabetes mellitus without complication, without long-term current use of insulin ([place]) Awaiting records from Dr. Buddy Duty.  Agree with switch to insulin given her attempts to conceive.  We discussed that if blood pressure is elevated above 140 over 90s, plan to start labetalol for blood pressure control.  6. Hypertension associated with diabetes ([place]) As above.  Currently under control.   Janora Norlander, DO [place] [phone]

## 2018-02-21 ENCOUNTER — Encounter: Payer: Self-pay | Admitting: Family Medicine

## 2018-02-24 ENCOUNTER — Encounter: Payer: Self-pay | Admitting: *Deleted

## 2018-02-24 ENCOUNTER — Other Ambulatory Visit: Payer: Self-pay | Admitting: Family Medicine

## 2018-02-24 MED ORDER — BASAGLAR KWIKPEN 100 UNIT/ML ~~LOC~~ SOPN: 10.0000 [IU] | PEN_INJECTOR | Freq: Every day | SUBCUTANEOUS | 0 refills | Status: DC

## 2018-02-27 ENCOUNTER — Encounter: Payer: Self-pay | Admitting: *Deleted

## 2018-02-28 ENCOUNTER — Encounter: Payer: Self-pay | Admitting: Family Medicine

## 2018-02-28 ENCOUNTER — Emergency Department (HOSPITAL_COMMUNITY): Payer: BC Managed Care – PPO

## 2018-02-28 ENCOUNTER — Emergency Department (HOSPITAL_COMMUNITY)
Admission: EM | Admit: 2018-02-28 | Discharge: 2018-02-28 | Disposition: A | Discharge status: Home / Self Care | Payer: BC Managed Care – PPO | Attending: Emergency Medicine | Admitting: Emergency Medicine

## 2018-02-28 ENCOUNTER — Telehealth: Payer: Self-pay | Admitting: Family Medicine

## 2018-02-28 ENCOUNTER — Encounter (HOSPITAL_COMMUNITY): Payer: Self-pay | Admitting: Emergency Medicine

## 2018-02-28 ENCOUNTER — Other Ambulatory Visit: Payer: Self-pay

## 2018-02-28 DIAGNOSIS — E119 Type 2 diabetes mellitus without complications: Secondary | ICD-10-CM | POA: Diagnosis not present

## 2018-02-28 DIAGNOSIS — R2242 Localized swelling, mass and lump, left lower limb: Secondary | ICD-10-CM | POA: Diagnosis not present

## 2018-02-28 DIAGNOSIS — E1165 Type 2 diabetes mellitus with hyperglycemia: Secondary | ICD-10-CM | POA: Diagnosis not present

## 2018-02-28 DIAGNOSIS — R739 Hyperglycemia, unspecified: Secondary | ICD-10-CM

## 2018-02-28 DIAGNOSIS — I10 Essential (primary) hypertension: Secondary | ICD-10-CM | POA: Insufficient documentation

## 2018-02-28 DIAGNOSIS — Z794 Long term (current) use of insulin: Secondary | ICD-10-CM | POA: Insufficient documentation

## 2018-02-28 DIAGNOSIS — R0600 Dyspnea, unspecified: Secondary | ICD-10-CM | POA: Insufficient documentation

## 2018-02-28 DIAGNOSIS — Z79899 Other long term (current) drug therapy: Secondary | ICD-10-CM | POA: Diagnosis not present

## 2018-02-28 DIAGNOSIS — R0602 Shortness of breath: Secondary | ICD-10-CM | POA: Diagnosis present

## 2018-02-28 DIAGNOSIS — M7989 Other specified soft tissue disorders: Secondary | ICD-10-CM

## 2018-02-28 LAB — CBC WITH DIFFERENTIAL/PLATELET
Abs Immature Granulocytes: 0.01 10*3/uL (ref 0.00–0.07)
Basophils Absolute: 0.1 10*3/uL (ref 0.0–0.1)
Basophils Relative: 1 %
Eosinophils Absolute: 0.2 10*3/uL (ref 0.0–0.5)
Eosinophils Relative: 3 %
HCT: 34.9 % — ABNORMAL LOW (ref 36.0–46.0)
Hemoglobin: 10.3 g/dL — ABNORMAL LOW (ref 12.0–15.0)
Immature Granulocytes: 0 %
Lymphocytes Relative: 35 %
Lymphs Abs: 2.2 10*3/uL (ref 0.7–4.0)
MCH: 23.8 pg — ABNORMAL LOW (ref 26.0–34.0)
MCHC: 29.5 g/dL — ABNORMAL LOW (ref 30.0–36.0)
MCV: 80.8 fL (ref 80.0–100.0)
Monocytes Absolute: 0.4 10*3/uL (ref 0.1–1.0)
Monocytes Relative: 7 %
Neutro Abs: 3.4 10*3/uL (ref 1.7–7.7)
Neutrophils Relative %: 54 %
Platelets: 476 10*3/uL — ABNORMAL HIGH (ref 150–400)
RBC: 4.32 MIL/uL (ref 3.87–5.11)
RDW: 15.4 % (ref 11.5–15.5)
WBC: 6.3 10*3/uL (ref 4.0–10.5)
nRBC: 0 % (ref 0.0–0.2)

## 2018-02-28 LAB — COMPREHENSIVE METABOLIC PANEL
ALT: 16 U/L (ref 0–44)
AST: 15 U/L (ref 15–41)
Albumin: 3.8 g/dL (ref 3.5–5.0)
Alkaline Phosphatase: 62 U/L (ref 38–126)
Anion gap: 8 (ref 5–15)
BUN: 16 mg/dL (ref 6–20)
CO2: 23 mmol/L (ref 22–32)
Calcium: 9 mg/dL (ref 8.9–10.3)
Chloride: 102 mmol/L (ref 98–111)
Creatinine, Ser: 0.53 mg/dL (ref 0.44–1.00)
GFR calc Af Amer: >60 mL/min (ref >60)
GFR calc non Af Amer: >60 mL/min (ref >60)
Glucose, Bld: 250 mg/dL — ABNORMAL HIGH (ref 70–99)
Potassium: 4.5 mmol/L (ref 3.5–5.1)
Sodium: 133 mmol/L — ABNORMAL LOW (ref 135–145)
Total Bilirubin: 0.6 mg/dL (ref 0.3–1.2)
Total Protein: 7.2 g/dL (ref 6.5–8.1)

## 2018-02-28 LAB — TROPONIN I: Troponin I: <0.03 ng/mL (ref <0.03)

## 2018-02-28 LAB — D-DIMER, QUANTITATIVE: D-Dimer, Quant: <0.27 ug/mL-FEU (ref 0.00–0.50)

## 2018-02-28 MED ORDER — IOPAMIDOL (ISOVUE-370) INJECTION 76%
100.0000 mL | Freq: Once | INTRAVENOUS | Status: AC | PRN
  Administered 2018-02-28: 100 mL via INTRAVENOUS

## 2018-02-28 NOTE — ED Notes (Signed)
Patient transported to CT 

## 2018-02-28 NOTE — ED Triage Notes (Signed)
Patient complaining of swelling to left leg x days. Started with shortness of breath today. NAD noted.

## 2018-02-28 NOTE — Telephone Encounter (Signed)
Refer to mychart message

## 2018-02-28 NOTE — ED Provider Notes (Signed)
[facility] EMERGENCY DEPARTMENT Provider Note   CSN: [id number] Arrival date & time: 02/28/18  1049     History   Chief Complaint Chief Complaint  Patient presents with  . Shortness of Breath    HPI Amy Ball is a 37 y.o. female.  Patient presents with swelling in her left lower extremity for several days followed by dyspnea today.  No chest pain.  No recent travel or prolonged immobilization.  No birth control pills.  She is diabetic and has had a recent change in her medications.  Social history: Radio producer.  Severity of symptoms is moderate.  Nothing makes symptoms better or worse.     Past Medical History:  Diagnosis Date  . Allergy    environ allergies  . Diabetes mellitus without complication ([place])    10 years  . GERD (gastroesophageal reflux disease)   . Hypertension     Patient Active Problem List   Diagnosis Date Noted  . Gastroesophageal reflux disease without esophagitis 02/20/2018  . Intertriginous candidiasis 02/20/2018  . Attempting to conceive 02/20/2018  . Obesity, Class III, BMI 40-49.9 (morbid obesity) ([place]) 02/15/2017  . Controlled type 2 diabetes mellitus without complication, without long-term current use of insulin ([place]) 02/15/2017  . Hypertension associated with diabetes ([place]) 02/15/2017  . Environmental allergies 02/15/2017  . Knee pain, right 10/01/2010  . Gait abnormality 10/01/2010    Past Surgical History:  Procedure Laterality Date  . FRACTURE SURGERY  2007   right ankle     OB History   None      Home Medications    Prior to Admission medications   Medication Sig Start Date End Date Taking? Authorizing Provider  Ascorbic Acid (VITAMIN C) 1000 MG tablet Take 1,000 mg by mouth daily.     Yes [provider]  cetirizine (ZYRTEC) 10 MG tablet Take 10 mg by mouth daily.   Yes [provider]  fluticasone (FLONASE) 50 MCG/ACT nasal spray Place 1 spray into both nostrils daily. 02/20/18  Yes  Gottschalk, Leatrice Jewels M, DO  Insulin Glargine (BASAGLAR KWIKPEN) 100 UNIT/ML SOPN Inject 0.1 mLs (10 Units total) into the skin daily. 02/24/18  Yes Gottschalk, Ashly M, DO  metFORMIN (GLUCOPHAGE) 1000 MG tablet TAKE 1 TABLET (1,000 MG TOTAL) 2 (TWO) TIMES DAILY WITH A MEAL BY MOUTH. 06/07/17  Yes Raylene Everts, MD  NOVOFINE 32G X 6 MM MISC  06/29/10  Yes [provider]  nystatin (NYSTATIN) powder Apply topically 3 (three) times daily. 02/20/18  Yes Ronnie Doss M, DO  nystatin cream (MYCOSTATIN) Apply 1 application topically 2 (two) times daily. 02/20/18  Yes Gottschalk, Ashly M, DO  pantoprazole (PROTONIX) 20 MG tablet Take 1 tablet (20 mg total) by mouth daily. 02/20/18  Yes Ronnie Doss M, DO  vitamin B-12 (CYANOCOBALAMIN) 1000 MCG tablet Take 1,000 mcg by mouth daily.   Yes [provider]    Family History Family History  Problem Relation Age of Onset  . Birth defects Mother        one hand small  . Arthritis Father   . Diabetes Father   . Heart disease Father 75       heart attack  . Psoriasis Father   . Psoriasis Brother   . Stroke Paternal Grandmother        age 74  . Cancer Paternal Grandfather     Social History Social History   Tobacco Use  . Smoking status: Never Smoker  . Smokeless tobacco: Never Used  Substance Use Topics  . Alcohol use: Yes    Frequency: Never    Comment: occasionally  . Drug use: No     Allergies   Amoxicillin-pot clavulanate and Avelox [moxifloxacin hcl in nacl]   Review of Systems Review of Systems  All other systems reviewed and are negative.    Physical Exam Updated Vital Signs BP 134/86 (BP Location: Right Arm)   Pulse 95   Temp 97.7 F (36.5 C) (Oral)   Resp 17   Ht 5\' 5"  (1.651 m)   Wt 108.9 kg   LMP 02/14/2018   SpO2 99%   BMI 39.94 kg/m   Physical Exam  Constitutional: She is oriented to person, place, and time. She appears well-developed and well-nourished.  Patient is overweight;  no obvious dyspnea or tachypnea  HENT:  Head: Normocephalic and atraumatic.  Eyes: Conjunctivae are normal.  Neck: Neck supple.  Cardiovascular: Normal rate and regular rhythm.  Pulmonary/Chest: Effort normal and breath sounds normal.  Abdominal: Soft. Bowel sounds are normal.  Musculoskeletal: Normal range of motion.  Neurological: She is alert and oriented to person, place, and time.  Skin: Skin is warm and dry.  Left lower extremity: No calf or posterior thigh tenderness.  Questionable lower extremity edema  Psychiatric: She has a normal mood and affect. Her behavior is normal.  Nursing note and vitals reviewed.    ED Treatments / Results  Labs (all labs ordered are listed, but only abnormal results are displayed) Labs Reviewed  CBC WITH DIFFERENTIAL/PLATELET - Abnormal; Notable for the following components:      Result Value   Hemoglobin 10.3 (*)    HCT 34.9 (*)    MCH 23.8 (*)    MCHC 29.5 (*)    Platelets 476 (*)    All other components within normal limits  COMPREHENSIVE METABOLIC PANEL - Abnormal; Notable for the following components:   Sodium 133 (*)    Glucose, Bld 250 (*)    All other components within normal limits  TROPONIN I  D-DIMER, QUANTITATIVE (NOT AT [facility])    EKG None  Radiology Ct Angio Chest Pe W/cm &/or Wo Cm  Result Date: 02/28/2018 CLINICAL DATA:  Shortness of breath.  Lower extremity swelling EXAM: CT ANGIOGRAPHY CHEST WITH CONTRAST TECHNIQUE: Multidetector CT imaging of the chest was performed using the standard protocol during bolus administration of intravenous contrast. Multiplanar CT image reconstructions and MIPs were obtained to evaluate the vascular anatomy. CONTRAST:  174mL ISOVUE-370 IOPAMIDOL (ISOVUE-370) INJECTION 76% COMPARISON:  Chest radiograph April 09, 2010 FINDINGS: Cardiovascular: There is no evident pulmonary embolus. There is no thoracic aortic aneurysm or dissection. Visualized great vessels appear unremarkable. No  pericardial effusion or pericardial thickening evident. Main pulmonary outflow tract is prominent measuring 3.2 cm in diameter. Mediastinum/Nodes: The left lobe of the thyroid and isthmus are either aplastic or have been removed. Right lobe of the thyroid appears normal. No adenopathy is appreciable. No thyroid lesions are evident. Lungs/Pleura: Lungs are clear. No evident pleural effusion or pleural thickening. Upper Abdomen: There Is a 1.1 x 1.0 cm left adrenal adenoma. Visualized upper abdominal structures otherwise are unremarkable. Musculoskeletal: There are no blastic or lytic bone lesions. No chest wall lesions are evident. Review of the MIP images confirms the above findings. IMPRESSION: 1. No demonstrable pulmonary embolus. No thoracic aortic aneurysm or dissection. 2. Prominence of the main pulmonary outflow tract, a finding indicative of pulmonary arterial hypertension. 3.  Lungs clear. 4.  No thoracic adenopathy evident. 5.  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 6.  Small benign left adrenal adenoma. Electronically Signed   By: Lowella Grip III M.D.   On: 02/28/2018 13:12   US Venous Img Lower Unilateral Left  Result Date: 02/28/2018 CLINICAL DATA:  Left lower extremity edema and shortness of breath EXAM: LEFT LOWER EXTREMITY VENOUS DOPPLER ULTRASOUND TECHNIQUE: Gray-scale sonography with graded compression, as well as color Doppler and duplex ultrasound were performed to evaluate the lower extremity deep venous systems from the level of the common femoral vein and including the common femoral, femoral, profunda femoral, popliteal and calf veins including the posterior tibial, peroneal and gastrocnemius veins when visible. The superficial great saphenous vein was also interrogated. Spectral Doppler was utilized to evaluate flow at rest and with distal augmentation maneuvers in the common femoral, femoral and popliteal veins. COMPARISON:  None. FINDINGS: Contralateral Common  Femoral Vein: Respiratory phasicity is normal and symmetric with the symptomatic side. No evidence of thrombus. Normal compressibility. Common Femoral Vein: No evidence of thrombus. Normal compressibility, respiratory phasicity and response to augmentation. Saphenofemoral Junction: No evidence of thrombus. Normal compressibility and flow on color Doppler imaging. Profunda Femoral Vein: No evidence of thrombus. Normal compressibility and flow on color Doppler imaging. Femoral Vein: No evidence of thrombus. Normal compressibility, respiratory phasicity and response to augmentation. Popliteal Vein: No evidence of thrombus. Normal compressibility, respiratory phasicity and response to augmentation. Calf Veins: No evidence of thrombus. Normal compressibility and flow on color Doppler imaging. Superficial Great Saphenous Vein: No evidence of thrombus. Normal compressibility. Venous Reflux:  None. Other Findings:  Left calf and ankle subcutaneous edema noted. IMPRESSION: No evidence of deep venous thrombosis. Electronically Signed   By: Jerilynn Mages.  Shick M.D.   On: 02/28/2018 13:34    Procedures Procedures (including critical care time)  Medications Ordered in ED Medications  iopamidol (ISOVUE-370) 76 % injection 100 mL (100 mLs Intravenous Contrast Given 02/28/18 1251)     Initial Impression / Assessment and Plan / ED Course  I have reviewed the triage vital signs and the nursing notes.  Pertinent labs & imaging results that were available during my care of the patient were reviewed by me and considered in my medical decision making (see chart for details).     Because of patient's unexplained dyspnea and left leg swelling, I pursued a CT angiogram of her chest.  This was negative.  No DVT in left lower extremity.  Glucose is elevated.  These findings were discussed with the patient and her family.  She will follow-up with primary care.  Final Clinical Impressions(s) / ED Diagnoses   Final diagnoses:    Dyspnea, unspecified type  Left leg swelling  Hyperglycemia    ED Discharge Orders    None       Nat Christen, MD 02/28/18 [phone]

## 2018-02-28 NOTE — Discharge Instructions (Addendum)
Tests showed no evidence of a blood clot in your leg or lungs.  Your glucose was elevated today at 250.  Recommend thyroid testing at your next doctor visit.

## 2018-03-06 ENCOUNTER — Encounter: Payer: Self-pay | Admitting: Family Medicine

## 2018-03-06 ENCOUNTER — Ambulatory Visit: Payer: BC Managed Care – PPO | Admitting: Family Medicine

## 2018-03-06 VITALS — BP 137/87 | HR 85 | Temp 97.1°F | Ht 65.0 in | Wt 266.0 lb

## 2018-03-06 DIAGNOSIS — D649 Anemia, unspecified: Secondary | ICD-10-CM

## 2018-03-06 DIAGNOSIS — E871 Hypo-osmolality and hyponatremia: Secondary | ICD-10-CM | POA: Diagnosis not present

## 2018-03-06 DIAGNOSIS — M7989 Other specified soft tissue disorders: Secondary | ICD-10-CM

## 2018-03-06 MED ORDER — FUROSEMIDE 20 MG PO TABS: 20.0000 mg | ORAL_TABLET | Freq: Every day | ORAL | 0 refills | Status: DC

## 2018-03-06 NOTE — Patient Instructions (Signed)
Come in on Wednesday or Thursday to have labs repeated.  I have given you several days of Lasix but only want you to take this for the next few days.  Then hold off on the remainder.  We are checking your thyroid and checking an anemia panel.  The labs should be back in the next couple days.

## 2018-03-06 NOTE — Progress Notes (Signed)
Subjective: CC: ER follow up PCP: Janora Norlander, DO OVZ:CHYIFOYDX Amy Ball is a 37 y.o. female presenting to clinic today for:  1. ED follow up Patient was seen in the emergency department on 02/28/2018 for left leg swelling and dyspnea.  She was ruled out for DVT and PE.  Labs were notable for hyponatremia to 133 and hemoglobin of 10.3.  Of note, she had recently finished her menses.  She attributes the lower extremity edema to the discontinuation of her lisinopril, Jardiance and Victoza.  She denies any shortness of breath now.  No orthopnea.  Lower extremity edema is persistent.  She also notes swelling in her hands.  She has reported weight gain.  No change in voice or difficulty swallowing.  Family history is significant for thyroid disorder in her father.   ROS: Per HPI  Allergies  Allergen Reactions  . Amoxicillin-Pot Clavulanate Anaphylaxis  . Avelox [Moxifloxacin Hcl In Nacl] Rash   Past Medical History:  Diagnosis Date  . Allergy    environ allergies  . Diabetes mellitus without complication ([place])    10 years  . GERD (gastroesophageal reflux disease)   . Hypertension     Current Outpatient Medications:  .  Ascorbic Acid (VITAMIN C) 1000 MG tablet, Take 1,000 mg by mouth daily.  , Disp: , Rfl:  .  cetirizine (ZYRTEC) 10 MG tablet, Take 10 mg by mouth daily., Disp: , Rfl:  .  fluticasone (FLONASE) 50 MCG/ACT nasal spray, Place 1 spray into both nostrils daily., Disp: 16 g, Rfl: 3 .  Insulin Glargine (BASAGLAR KWIKPEN) 100 UNIT/ML SOPN, Inject 0.1 mLs (10 Units total) into the skin daily. (Patient taking differently: Inject 14 Units into the skin daily. ), Disp: 3 mL, Rfl: 0 .  metFORMIN (GLUCOPHAGE) 1000 MG tablet, TAKE 1 TABLET (1,000 MG TOTAL) 2 (TWO) TIMES DAILY WITH A MEAL BY MOUTH., Disp: 180 tablet, Rfl: 0 .  NOVOFINE 32G X 6 MM MISC, , Disp: , Rfl:  .  nystatin (NYSTATIN) powder, Apply topically 3 (three) times daily., Disp: 15 g, Rfl: 0 .  nystatin cream  (MYCOSTATIN), Apply 1 application topically 2 (two) times daily., Disp: 30 g, Rfl: 0 .  pantoprazole (PROTONIX) 20 MG tablet, Take 1 tablet (20 mg total) by mouth daily., Disp: 90 tablet, Rfl: 1 .  vitamin B-12 (CYANOCOBALAMIN) 1000 MCG tablet, Take 1,000 mcg by mouth daily., Disp: , Rfl:  Social History   Socioeconomic History  . Marital status: Married    Spouse name: Yong Channel  . Number of children: Not on file  . Years of education: 65  . Highest education level: Master's degree (e.g., MA, MS, MEng, MEd, MSW, MBA)  Occupational History  . Occupation: third grade teacher  Social Needs  . Financial resource strain: Not hard at all  . Food insecurity:    Worry: Never true    Inability: Never true  . Transportation needs:    Medical: No    Non-medical: No  Tobacco Use  . Smoking status: Never Smoker  . Smokeless tobacco: Never Used  Substance and Sexual Activity  . Alcohol use: Yes    Frequency: Never    Comment: occasionally  . Drug use: No  . Sexual activity: Yes  Lifestyle  . Physical activity:    Days per week: 6 days    Minutes per session: 40 min  . Stress: Only a little  Relationships  . Social connections:    Talks on phone: More than three times  a week    Gets together: More than three times a week    Attends religious service: More than 4 times per year    Active member of club or organization: No    Attends meetings of clubs or organizations: Never    Relationship status: Married  . Intimate partner violence:    Fear of current or ex partner: No    Emotionally abused: No    Physically abused: No    Forced sexual activity: No  Other Topics Concern  . Not on file  Social History Narrative   Engaged to be married December 8 to [place]   Family History  Problem Relation Age of Onset  . Birth defects Mother        one hand small  . Arthritis Father   . Diabetes Father   . Heart disease Father 19       heart attack  . Psoriasis Father   . Psoriasis  Brother   . Stroke Paternal Grandmother        age 29  . Cancer Paternal Grandfather     Objective: Office vital signs reviewed. BP 137/87   Pulse 85   Temp (!) 97.1 F (36.2 C) (Oral)   Ht 5\' 5"  (1.651 m)   Wt 266 lb (120.7 kg)   LMP 02/14/2018   BMI 44.26 kg/m   Physical Examination:  General: Awake, alert, obese, No acute distress HEENT: Normal    Neck: No goiter    Eyes: Sclera white.  No exophthalmos.    Throat: moist mucus membranes Cardio: regular rate  Pulm:  normal work of breathing on room air Extremities: warm, well perfused, Non pitting edema noted in bilateral UE and LE. No cyanosis or clubbing; +2 pulses bilaterally  Assessment/ Plan: 37 y.o. female   1. Anemia, unspecified type I reviewed her emergency department note and lab results.  Will perform an anemia profile.  Of note, she is currently on a prenatal vitamin containing iron and folic acid.  She has a history of B12 deficiency that previously required B12 injections.  She is also on oral vitamin B12 OTC. - Anemia Profile B  2. Leg swelling Edema may be related to anemia versus undiagnosed thyroid disorder.  Check TSH, repeat BMP.  Anemia profile as above.  She was prescribed Lasix 20 mg to take daily for the next 3 days.  She will return on Wednesday or Thursday for repeat BMP to monitor electrolytes. - TSH - Basic Metabolic Panel - Basic Metabolic Panel; Future  3. Hyponatremia ?  Possibly dilutional.  Not currently on any medications which would promote a low sodium level.  Plan as above. - Basic Metabolic Panel - Basic Metabolic Panel; Future   Orders Placed This Encounter  Procedures  . TSH  . Anemia Profile B  . Basic Metabolic Panel  . Basic Metabolic Panel    Standing Status:   Future    Standing Expiration Date:   03/07/2019   Meds ordered this encounter  Medications  . furosemide (LASIX) 20 MG tablet    Sig: Take 1 tablet (20 mg total) by mouth daily for 3 days.    Dispense:   10 tablet    Refill:  0     Amy Senn Windell Moulding, DO [place] [phone]

## 2018-03-07 ENCOUNTER — Other Ambulatory Visit: Payer: Self-pay | Admitting: Family Medicine

## 2018-03-07 ENCOUNTER — Encounter: Payer: Self-pay | Admitting: Family Medicine

## 2018-03-07 DIAGNOSIS — E039 Hypothyroidism, unspecified: Secondary | ICD-10-CM

## 2018-03-07 DIAGNOSIS — D509 Iron deficiency anemia, unspecified: Secondary | ICD-10-CM

## 2018-03-07 LAB — ANEMIA PROFILE B
Basophils Absolute: 0.1 10*3/uL (ref 0.0–0.2)
Basos: 1 %
EOS (ABSOLUTE): 0.2 10*3/uL (ref 0.0–0.4)
Eos: 3 %
Ferritin: 9 ng/mL — ABNORMAL LOW (ref 15–150)
Folate: >20 ng/mL (ref >3.0)
Hematocrit: 31.3 % — ABNORMAL LOW (ref 34.0–46.6)
Hemoglobin: 9.6 g/dL — ABNORMAL LOW (ref 11.1–15.9)
Immature Grans (Abs): 0 10*3/uL (ref 0.0–0.1)
Immature Granulocytes: 0 %
Iron Saturation: 5 % — CL (ref 15–55)
Iron: 19 ug/dL — ABNORMAL LOW (ref 27–159)
Lymphocytes Absolute: 2.8 10*3/uL (ref 0.7–3.1)
Lymphs: 35 %
MCH: 24.1 pg — ABNORMAL LOW (ref 26.6–33.0)
MCHC: 30.7 g/dL — ABNORMAL LOW (ref 31.5–35.7)
MCV: 79 fL (ref 79–97)
Monocytes Absolute: 0.5 10*3/uL (ref 0.1–0.9)
Monocytes: 6 %
Neutrophils Absolute: 4.5 10*3/uL (ref 1.4–7.0)
Neutrophils: 55 %
Platelets: 495 10*3/uL — ABNORMAL HIGH (ref 150–450)
RBC: 3.98 x10E6/uL (ref 3.77–5.28)
RDW: 15.1 % (ref 12.3–15.4)
Retic Ct Pct: 1.3 % (ref 0.6–2.6)
Total Iron Binding Capacity: 371 ug/dL (ref 250–450)
UIBC: 352 ug/dL (ref 131–425)
Vitamin B-12: 810 pg/mL (ref 232–1245)
WBC: 8.1 10*3/uL (ref 3.4–10.8)

## 2018-03-07 LAB — TSH: TSH: 5.59 u[IU]/mL — ABNORMAL HIGH (ref 0.450–4.500)

## 2018-03-07 LAB — BASIC METABOLIC PANEL
BUN/Creatinine Ratio: 24 — ABNORMAL HIGH (ref 9–23)
BUN: 14 mg/dL (ref 6–20)
CO2: 22 mmol/L (ref 20–29)
Calcium: 9.4 mg/dL (ref 8.7–10.2)
Chloride: 99 mmol/L (ref 96–106)
Creatinine, Ser: 0.59 mg/dL (ref 0.57–1.00)
GFR calc Af Amer: 136 mL/min/{1.73_m2} (ref >59)
GFR calc non Af Amer: 118 mL/min/{1.73_m2} (ref >59)
Glucose: 181 mg/dL — ABNORMAL HIGH (ref 65–99)
Potassium: 4.3 mmol/L (ref 3.5–5.2)
Sodium: 136 mmol/L (ref 134–144)

## 2018-03-07 MED ORDER — LEVOTHYROXINE SODIUM 100 MCG PO TABS: 100.0000 ug | ORAL_TABLET | Freq: Every day | ORAL | 1 refills | Status: DC

## 2018-03-09 ENCOUNTER — Other Ambulatory Visit: Payer: BC Managed Care – PPO

## 2018-03-09 ENCOUNTER — Encounter: Payer: Self-pay | Admitting: Family Medicine

## 2018-03-09 DIAGNOSIS — E871 Hypo-osmolality and hyponatremia: Secondary | ICD-10-CM

## 2018-03-09 DIAGNOSIS — M7989 Other specified soft tissue disorders: Secondary | ICD-10-CM

## 2018-03-09 DIAGNOSIS — D509 Iron deficiency anemia, unspecified: Secondary | ICD-10-CM

## 2018-03-09 DIAGNOSIS — E039 Hypothyroidism, unspecified: Secondary | ICD-10-CM

## 2018-03-10 LAB — CBC
Hematocrit: 33 % — ABNORMAL LOW (ref 34.0–46.6)
Hemoglobin: 10.2 g/dL — ABNORMAL LOW (ref 11.1–15.9)
MCH: 24.3 pg — ABNORMAL LOW (ref 26.6–33.0)
MCHC: 30.9 g/dL — ABNORMAL LOW (ref 31.5–35.7)
MCV: 79 fL (ref 79–97)
Platelets: 485 10*3/uL — ABNORMAL HIGH (ref 150–450)
RBC: 4.19 x10E6/uL (ref 3.77–5.28)
RDW: 15 % (ref 12.3–15.4)
WBC: 8.2 10*3/uL (ref 3.4–10.8)

## 2018-03-10 LAB — THYROID PANEL WITH TSH
Free Thyroxine Index: 3 (ref 1.2–4.9)
T3 Uptake Ratio: 24 % (ref 24–39)
T4, Total: 12.5 ug/dL — ABNORMAL HIGH (ref 4.5–12.0)
TSH: 3.39 u[IU]/mL (ref 0.450–4.500)

## 2018-03-10 LAB — THYROGLOBULIN ANTIBODY: Thyroglobulin Antibody: <1 IU/mL (ref 0.0–0.9)

## 2018-03-10 LAB — BASIC METABOLIC PANEL
BUN/Creatinine Ratio: 32 — ABNORMAL HIGH (ref 9–23)
BUN: 19 mg/dL (ref 6–20)
CO2: 20 mmol/L (ref 20–29)
Calcium: 9.3 mg/dL (ref 8.7–10.2)
Chloride: 97 mmol/L (ref 96–106)
Creatinine, Ser: 0.6 mg/dL (ref 0.57–1.00)
GFR calc Af Amer: 135 mL/min/{1.73_m2} (ref >59)
GFR calc non Af Amer: 117 mL/min/{1.73_m2} (ref >59)
Glucose: 221 mg/dL — ABNORMAL HIGH (ref 65–99)
Potassium: 4.5 mmol/L (ref 3.5–5.2)
Sodium: 136 mmol/L (ref 134–144)

## 2018-03-10 LAB — THYROID PEROXIDASE ANTIBODY: Thyroperoxidase Ab SerPl-aCnc: 10 IU/mL (ref 0–34)

## 2018-03-15 ENCOUNTER — Encounter: Payer: Self-pay | Admitting: Family Medicine

## 2018-03-17 ENCOUNTER — Other Ambulatory Visit (HOSPITAL_COMMUNITY): Payer: Self-pay | Admitting: Internal Medicine

## 2018-03-17 DIAGNOSIS — R0602 Shortness of breath: Secondary | ICD-10-CM

## 2018-03-20 ENCOUNTER — Other Ambulatory Visit: Payer: Self-pay | Admitting: Family Medicine

## 2018-03-20 ENCOUNTER — Encounter: Payer: Self-pay | Admitting: Family Medicine

## 2018-03-20 MED ORDER — METFORMIN HCL 1000 MG PO TABS: 1000.0000 mg | ORAL_TABLET | Freq: Two times a day (BID) | ORAL | 0 refills | Status: DC

## 2018-03-30 ENCOUNTER — Other Ambulatory Visit: Payer: Self-pay | Admitting: Family Medicine

## 2018-03-30 ENCOUNTER — Other Ambulatory Visit (HOSPITAL_COMMUNITY): Payer: BC Managed Care – PPO

## 2018-04-13 ENCOUNTER — Ambulatory Visit (INDEPENDENT_AMBULATORY_CARE_PROVIDER_SITE_OTHER): Payer: 59 | Admitting: Family Medicine

## 2018-04-13 ENCOUNTER — Encounter: Payer: Self-pay | Admitting: Family Medicine

## 2018-04-13 VITALS — BP 151/89 | HR 103 | Temp 97.9°F | Ht 65.0 in | Wt 258.0 lb

## 2018-04-13 DIAGNOSIS — I10 Essential (primary) hypertension: Secondary | ICD-10-CM | POA: Diagnosis not present

## 2018-04-13 DIAGNOSIS — D509 Iron deficiency anemia, unspecified: Secondary | ICD-10-CM | POA: Diagnosis not present

## 2018-04-13 DIAGNOSIS — Z0000 Encounter for general adult medical examination without abnormal findings: Secondary | ICD-10-CM

## 2018-04-13 DIAGNOSIS — E1159 Type 2 diabetes mellitus with other circulatory complications: Secondary | ICD-10-CM

## 2018-04-13 DIAGNOSIS — E1165 Type 2 diabetes mellitus with hyperglycemia: Secondary | ICD-10-CM | POA: Diagnosis not present

## 2018-04-13 DIAGNOSIS — Z23 Encounter for immunization: Secondary | ICD-10-CM

## 2018-04-13 DIAGNOSIS — Z5181 Encounter for therapeutic drug level monitoring: Secondary | ICD-10-CM | POA: Diagnosis not present

## 2018-04-13 DIAGNOSIS — R7989 Other specified abnormal findings of blood chemistry: Secondary | ICD-10-CM | POA: Diagnosis not present

## 2018-04-13 DIAGNOSIS — E538 Deficiency of other specified B group vitamins: Secondary | ICD-10-CM | POA: Diagnosis not present

## 2018-04-13 DIAGNOSIS — Z0001 Encounter for general adult medical examination with abnormal findings: Secondary | ICD-10-CM | POA: Diagnosis not present

## 2018-04-13 DIAGNOSIS — Z3200 Encounter for pregnancy test, result unknown: Secondary | ICD-10-CM | POA: Diagnosis not present

## 2018-04-13 DIAGNOSIS — D3502 Benign neoplasm of left adrenal gland: Secondary | ICD-10-CM | POA: Diagnosis not present

## 2018-04-13 DIAGNOSIS — I152 Hypertension secondary to endocrine disorders: Secondary | ICD-10-CM

## 2018-04-13 MED ORDER — LABETALOL HCL 100 MG PO TABS: 100.0000 mg | ORAL_TABLET | Freq: Two times a day (BID) | ORAL | 1 refills | Status: DC

## 2018-04-13 MED FILL — GLYBURIDE 2.5 MG TABS: 2.5 | 90 days supply | Qty: 90 | Fill #0

## 2018-04-13 MED FILL — LABETALOL HCL 100 MG TABS: 100 | 30 days supply | Qty: 60 | Fill #0

## 2018-04-13 NOTE — Patient Instructions (Addendum)
I have sent in Labetalol 100 mg for you to take twice daily.  We will see how you do on this dose and potentially increase it pending what your blood pressures look like in the next 7 to 10 days.  Send me a message via my chart to let me know how things are going and what your blood pressures are running.  We will plan titrating this before next appointment pending that message.  Sometimes medicines in this class can cause some sleepiness to be mindful of that while you are getting onto the medication.  Labetalol is more commonly used in pregnancy.  Also, ok to take Zyrtec in the evening and Claritin during the day if needed for allergy symptoms.

## 2018-04-13 NOTE — Progress Notes (Signed)
Amy Ball is a 38 y.o. female presents to office today for annual physical exam examination.    Concerns today include: 1.  Hypertension Patient reports that she has been off of medications for a couple of months now.  Initially she was having headache but this has resolved over the last week.  Denies any visual disturbance.  Lower extremity swelling has totally resolved since discontinuation of the insulin.  She is picking up a new medication to replace this but she is not quite sure what the name is.  She sees Dr. Buddy Duty in [facility] for endocrinology.  Marital status: married, Substance use: none Diet: trying to improve, Exercise: plans to start exercise regimen soon Last eye exam: UTD Last dental exam: UTD Last pap smear: DUE.  Was referred to OB/GYN.  Plans to see Dr Ronita Hipps soon Refills needed today: none Immunizations needed: PNA vaccine  Past Medical History:  Diagnosis Date  . Allergy    environ allergies  . Diabetes mellitus without complication ([place])    10 years  . GERD (gastroesophageal reflux disease)   . Hypertension    Social History   Socioeconomic History  . Marital status: Married    Spouse name: Yong Channel  . Number of children: Not on file  . Years of education: 42  . Highest education level: Master's degree (e.g., MA, MS, MEng, MEd, MSW, MBA)  Occupational History  . Occupation: third grade teacher  Social Needs  . Financial resource strain: Not hard at all  . Food insecurity:    Worry: Never true    Inability: Never true  . Transportation needs:    Medical: No    Non-medical: No  Tobacco Use  . Smoking status: Never Smoker  . Smokeless tobacco: Never Used  Substance and Sexual Activity  . Alcohol use: Yes    Frequency: Never    Comment: occasionally  . Drug use: No  . Sexual activity: Yes  Lifestyle  . Physical activity:    Days per week: 6 days    Minutes per session: 40 min  . Stress: Only a little  Relationships  . Social  connections:    Talks on phone: More than three times a week    Gets together: More than three times a week    Attends religious service: More than 4 times per year    Active member of club or organization: No    Attends meetings of clubs or organizations: Never    Relationship status: Married  . Intimate partner violence:    Fear of current or ex partner: No    Emotionally abused: No    Physically abused: No    Forced sexual activity: No  Other Topics Concern  . Not on file  Social History Narrative   Engaged to be married December 8 to [place]   Past Surgical History:  Procedure Laterality Date  . FRACTURE SURGERY  2007   right ankle   Family History  Problem Relation Age of Onset  . Birth defects Mother        one hand small  . Arthritis Father   . Diabetes Father   . Heart disease Father 7       heart attack  . Psoriasis Father   . Psoriasis Brother   . Stroke Paternal Grandmother        age 28  . Cancer Paternal Grandfather     Current Outpatient Medications:  .  Ascorbic Acid (VITAMIN C) 1000 MG tablet,  Take 1,000 mg by mouth daily.  , Disp: , Rfl:  .  cetirizine (ZYRTEC) 10 MG tablet, Take 10 mg by mouth daily., Disp: , Rfl:  .  fluticasone (FLONASE) 50 MCG/ACT nasal spray, Place 1 spray into both nostrils daily., Disp: 16 g, Rfl: 3 .  furosemide (LASIX) 20 MG tablet, Take 1 tablet (20 mg total) by mouth daily for 3 days., Disp: 10 tablet, Rfl: 0 .  levothyroxine (SYNTHROID, LEVOTHROID) 100 MCG tablet, Take 1 tablet (100 mcg total) by mouth daily., Disp: 30 tablet, Rfl: 1 .  metFORMIN (GLUCOPHAGE) 1000 MG tablet, Take 1 tablet (1,000 mg total) by mouth 2 (two) times daily with a meal., Disp: 180 tablet, Rfl: 0 .  NOVOFINE 32G X 6 MM MISC, , Disp: , Rfl:  .  nystatin (NYSTATIN) powder, Apply topically 3 (three) times daily., Disp: 15 g, Rfl: 0 .  nystatin cream (MYCOSTATIN), Apply 1 application topically 2 (two) times daily., Disp: 30 g, Rfl: 0 .  pantoprazole  (PROTONIX) 20 MG tablet, Take 1 tablet (20 mg total) by mouth daily., Disp: 90 tablet, Rfl: 1 .  vitamin B-12 (CYANOCOBALAMIN) 1000 MCG tablet, Take 1,000 mcg by mouth daily., Disp: , Rfl:   Allergies  Allergen Reactions  . Amoxicillin-Pot Clavulanate Anaphylaxis  . Avelox [Moxifloxacin Hcl In Nacl] Rash     ROS: Review of Systems Constitutional: negative Eyes: negative Ears, nose, mouth, throat, and face: She has had some rhinorrhea and postnasal drip.  Currently taking Flonase and Zyrtec.  No fevers. Respiratory: negative Cardiovascular: negative Gastrointestinal: negative Genitourinary:Had been 3 weeks without menses but this just started today. Integument/breast: negative Hematologic/lymphatic: negative Musculoskeletal:negative Neurological: negative Behavioral/Psych: negative Endocrine: negative Allergic/Immunologic: negative    Physical exam BP (!) 151/89   Pulse (!) 103   Temp 97.9 F (36.6 C) (Oral)   Ht 5\' 5"  (1.651 m)   Wt 258 lb (117 kg)   BMI 42.93 kg/m  General appearance: alert, cooperative, appears stated age and morbidly obese Head: Normocephalic, without obvious abnormality, atraumatic Eyes: negative findings: lids and lashes normal, conjunctivae and sclerae normal, corneas clear and pupils equal, round, reactive to light and accomodation Ears: normal TM's and external ear canals both ears Nose: Turbinates moist.  Clear drainage noted. Throat: lips, mucosa, and tongue normal; teeth and gums normal Neck: no adenopathy, no carotid bruit, no JVD, supple, symmetrical, trachea midline and thyroid not enlarged, symmetric, no tenderness/mass/nodules Back: symmetric, no curvature. ROM normal. No CVA tenderness. Lungs: clear to auscultation bilaterally Heart: regular rate and rhythm, S1, S2 normal, no murmur, click, rub or gallop Abdomen: soft, non-tender; bowel sounds normal; no masses,  no organomegaly Extremities: extremities normal, atraumatic, no cyanosis or  edema Pulses: 2+ and symmetric Skin: Skin color, texture, turgor normal. No rashes or lesions Lymph nodes: Cervical, supraclavicular, and axillary nodes normal. Neurologic: Alert and oriented X 3, normal strength and tone. Normal symmetric reflexes. Normal coordination and gait  Psych: Mood stable, speech normal, affect appropriate pleasant interactive. Depression screen [facility] 2/9 04/13/2018 02/20/2018 04/22/2017  Decreased Interest 0 0 0  Down, Depressed, Hopeless 0 0 0  PHQ - 2 Score 0 0 0  Altered sleeping 0 0 -  Tired, decreased energy 0 0 -  Change in appetite 0 0 -  Feeling bad or failure about yourself  0 0 -  Trouble concentrating 0 0 -  Moving slowly or fidgety/restless 0 0 -  Suicidal thoughts 0 0 -  PHQ-9 Score 0 0 -  Difficult  doing work/chores Not difficult at all Not difficult at all -      Assessment/ Plan: [place] Ped here for annual physical exam.   1. Well woman exam (no gynecological exam) She will have gynecologic exam performed with Dr. Ronita Hipps at her appointment in the next couple of months.  She was given her pneumococcal vaccination for diabetes.  She wants to hold off on tetanus vaccination, as she is planning on becoming pregnant this year and will likely have to have that repeated during her third trimester.  2. Hypertension associated with diabetes ([place]) Not at goal.  Because she is planning on pregnancy would like to have a medication that would be safe in pregnancy we have started her on labetalol 100 mg p.o. twice daily which is a class C but is commonly used in pregnancy.  We discussed the need for possible titration of the medication.  She will contact me via my chart in about 2 weeks with her blood pressure readings and we will decide if this needs to be increased based on those.  She will follow-up in 1 month or sooner if needed for recheck.   Counseled on healthy lifestyle choices, including diet (rich in fruits, vegetables and lean meats and low in  salt and simple carbohydrates) and exercise (at least 30 minutes of moderate physical activity daily).  Patient to follow up in 1 year for annual exam or sooner if needed.  Jacklyne Baik M. Lajuana Ripple, DO

## 2018-04-16 ENCOUNTER — Encounter: Payer: Self-pay | Admitting: Family Medicine

## 2018-04-17 ENCOUNTER — Encounter: Payer: Self-pay | Admitting: Family Medicine

## 2018-04-19 ENCOUNTER — Other Ambulatory Visit: Payer: Self-pay | Admitting: Family Medicine

## 2018-04-19 MED ORDER — GLYBURIDE 2.5 MG PO TABS: 2.5000 mg | ORAL_TABLET | Freq: Every day | ORAL | 0 refills | Status: DC

## 2018-05-15 ENCOUNTER — Ambulatory Visit: Payer: 59 | Admitting: Family Medicine

## 2018-05-15 MED FILL — PANTOPRAZOLE SOD DR 20 MG T: 20 | 90 days supply | Qty: 90 | Fill #0

## 2018-05-18 ENCOUNTER — Encounter: Payer: Self-pay | Admitting: Family Medicine

## 2018-05-19 ENCOUNTER — Ambulatory Visit: Payer: 59 | Admitting: Family Medicine

## 2018-05-24 ENCOUNTER — Other Ambulatory Visit: Payer: Self-pay | Admitting: Physician Assistant

## 2018-05-24 ENCOUNTER — Encounter: Payer: Self-pay | Admitting: Family Medicine

## 2018-05-24 MED ORDER — SULFAMETHOXAZOLE-TRIMETHOPRIM 800-160 MG PO TABS: 1.0000 | ORAL_TABLET | Freq: Two times a day (BID) | ORAL | 0 refills | Status: DC

## 2018-05-26 ENCOUNTER — Ambulatory Visit: Payer: 59 | Admitting: Family Medicine

## 2018-05-30 MED FILL — glyBURIDE 5 MG TABS: 5 | 90 days supply | Qty: 90 | Fill #0

## 2018-06-02 DIAGNOSIS — Z6841 Body Mass Index (BMI) 40.0 and over, adult: Secondary | ICD-10-CM | POA: Diagnosis not present

## 2018-06-02 DIAGNOSIS — Z1151 Encounter for screening for human papillomavirus (HPV): Secondary | ICD-10-CM | POA: Diagnosis not present

## 2018-06-02 DIAGNOSIS — Z01419 Encounter for gynecological examination (general) (routine) without abnormal findings: Secondary | ICD-10-CM | POA: Diagnosis not present

## 2018-06-02 DIAGNOSIS — Z13228 Encounter for screening for other metabolic disorders: Secondary | ICD-10-CM | POA: Diagnosis not present

## 2018-06-06 ENCOUNTER — Ambulatory Visit: Payer: 59 | Admitting: Family Medicine

## 2018-06-12 ENCOUNTER — Ambulatory Visit: Payer: 59 | Admitting: Family Medicine

## 2018-06-18 ENCOUNTER — Encounter: Payer: Self-pay | Admitting: Family Medicine

## 2018-06-19 ENCOUNTER — Other Ambulatory Visit: Payer: Self-pay | Admitting: Family Medicine

## 2018-06-19 MED ORDER — LABETALOL HCL 100 MG PO TABS: 100.0000 mg | ORAL_TABLET | Freq: Two times a day (BID) | ORAL | 1 refills | Status: DC

## 2018-06-19 MED ORDER — METFORMIN HCL 1000 MG PO TABS: 1000.0000 mg | ORAL_TABLET | Freq: Two times a day (BID) | ORAL | 1 refills | Status: DC

## 2018-06-19 MED FILL — metFORMIN HCL 1000 MG TABS: 1000 | 90 days supply | Qty: 180 | Fill #0

## 2018-06-21 ENCOUNTER — Ambulatory Visit: Payer: 59 | Admitting: Family Medicine

## 2018-07-05 ENCOUNTER — Encounter: Payer: Self-pay | Admitting: Family Medicine

## 2018-07-05 ENCOUNTER — Other Ambulatory Visit: Payer: Self-pay | Admitting: *Deleted

## 2018-07-05 MED ORDER — FLUTICASONE PROPIONATE 50 MCG/ACT NA SUSP: 1.0000 | Freq: Every day | NASAL | 3 refills | Status: DC

## 2018-07-05 MED FILL — FLUTICASONE PROP 50 MCG SPR: 50 | 30 days supply | Qty: 16 | Fill #0

## 2018-07-20 ENCOUNTER — Encounter: Payer: Self-pay | Admitting: Family Medicine

## 2018-07-21 ENCOUNTER — Other Ambulatory Visit: Payer: Self-pay | Admitting: Family Medicine

## 2018-07-21 MED ORDER — NYSTATIN 100000 UNIT/GM EX CREA: 1.0000 "application " | TOPICAL_CREAM | Freq: Two times a day (BID) | CUTANEOUS | 0 refills | Status: DC

## 2018-07-21 MED ORDER — NYSTATIN 100000 UNIT/GM EX POWD: Freq: Three times a day (TID) | CUTANEOUS | 0 refills | Status: DC

## 2018-08-02 ENCOUNTER — Encounter: Payer: Self-pay | Admitting: Family Medicine

## 2018-08-03 DIAGNOSIS — Z3200 Encounter for pregnancy test, result unknown: Secondary | ICD-10-CM | POA: Diagnosis not present

## 2018-08-10 DIAGNOSIS — R7989 Other specified abnormal findings of blood chemistry: Secondary | ICD-10-CM | POA: Diagnosis not present

## 2018-08-10 DIAGNOSIS — D3502 Benign neoplasm of left adrenal gland: Secondary | ICD-10-CM | POA: Diagnosis not present

## 2018-08-10 DIAGNOSIS — E1165 Type 2 diabetes mellitus with hyperglycemia: Secondary | ICD-10-CM | POA: Diagnosis not present

## 2018-08-10 DIAGNOSIS — Z5181 Encounter for therapeutic drug level monitoring: Secondary | ICD-10-CM | POA: Diagnosis not present

## 2018-08-10 DIAGNOSIS — O24111 Pre-existing diabetes mellitus, type 2, in pregnancy, first trimester: Secondary | ICD-10-CM | POA: Diagnosis not present

## 2018-08-14 ENCOUNTER — Other Ambulatory Visit: Payer: Self-pay | Admitting: Family Medicine

## 2018-08-14 MED FILL — PANTOPRAZOLE SOD DR 20 MG T: 20 | 90 days supply | Qty: 90 | Fill #0

## 2018-08-18 DIAGNOSIS — O24111 Pre-existing diabetes mellitus, type 2, in pregnancy, first trimester: Secondary | ICD-10-CM | POA: Diagnosis not present

## 2018-08-18 DIAGNOSIS — Z794 Long term (current) use of insulin: Secondary | ICD-10-CM | POA: Diagnosis not present

## 2018-08-18 DIAGNOSIS — Z3201 Encounter for pregnancy test, result positive: Secondary | ICD-10-CM | POA: Diagnosis not present

## 2018-08-18 DIAGNOSIS — D3502 Benign neoplasm of left adrenal gland: Secondary | ICD-10-CM | POA: Diagnosis not present

## 2018-08-18 DIAGNOSIS — R946 Abnormal results of thyroid function studies: Secondary | ICD-10-CM | POA: Diagnosis not present

## 2018-08-18 DIAGNOSIS — E1165 Type 2 diabetes mellitus with hyperglycemia: Secondary | ICD-10-CM | POA: Diagnosis not present

## 2018-08-18 DIAGNOSIS — Z79899 Other long term (current) drug therapy: Secondary | ICD-10-CM | POA: Diagnosis not present

## 2018-08-22 DIAGNOSIS — Z3687 Encounter for antenatal screening for uncertain dates: Secondary | ICD-10-CM | POA: Diagnosis not present

## 2018-08-22 DIAGNOSIS — E119 Type 2 diabetes mellitus without complications: Secondary | ICD-10-CM | POA: Diagnosis not present

## 2018-08-29 MED FILL — FLUTICASONE PROP 50 MCG SPR: 50 | 30 days supply | Qty: 16 | Fill #1

## 2018-09-04 DIAGNOSIS — O021 Missed abortion: Secondary | ICD-10-CM | POA: Diagnosis not present

## 2018-09-04 MED FILL — ACCU-CHEK GUIDE TEST STRIP: 33 days supply | Qty: 100 | Fill #0

## 2018-09-05 ENCOUNTER — Other Ambulatory Visit: Payer: Self-pay | Admitting: Family Medicine

## 2018-09-05 MED ORDER — INSULIN REGULAR HUMAN 100 UNIT/ML IJ SOLN: 12.0000 [IU] | Freq: Three times a day (TID) | INTRAMUSCULAR | 11 refills | Status: DC

## 2018-09-08 DIAGNOSIS — O021 Missed abortion: Secondary | ICD-10-CM | POA: Diagnosis not present

## 2018-09-11 DIAGNOSIS — Z5181 Encounter for therapeutic drug level monitoring: Secondary | ICD-10-CM | POA: Diagnosis not present

## 2018-09-11 DIAGNOSIS — E1165 Type 2 diabetes mellitus with hyperglycemia: Secondary | ICD-10-CM | POA: Diagnosis not present

## 2018-09-11 DIAGNOSIS — D3502 Benign neoplasm of left adrenal gland: Secondary | ICD-10-CM | POA: Diagnosis not present

## 2018-09-12 DIAGNOSIS — O200 Threatened abortion: Secondary | ICD-10-CM | POA: Diagnosis not present

## 2018-09-15 MED FILL — metFORMIN HCL 1000 MG TABS: 1000 | 90 days supply | Qty: 180 | Fill #1

## 2018-09-18 ENCOUNTER — Telehealth: Payer: Self-pay | Admitting: Family Medicine

## 2018-09-18 DIAGNOSIS — O039 Complete or unspecified spontaneous abortion without complication: Secondary | ICD-10-CM | POA: Diagnosis not present

## 2018-09-18 NOTE — Telephone Encounter (Signed)
We do not have that information, pt aware

## 2018-09-26 DIAGNOSIS — R609 Edema, unspecified: Secondary | ICD-10-CM | POA: Diagnosis not present

## 2018-09-26 DIAGNOSIS — O021 Missed abortion: Secondary | ICD-10-CM | POA: Diagnosis not present

## 2018-10-04 MED FILL — FLUTICASONE PROP 50 MCG SPR: 50 | 30 days supply | Qty: 16 | Fill #2

## 2018-10-04 MED FILL — ACCU-CHEK GUIDE TEST STRIP: 33 days supply | Qty: 100 | Fill #1

## 2018-10-13 DIAGNOSIS — O021 Missed abortion: Secondary | ICD-10-CM | POA: Diagnosis not present

## 2018-10-13 DIAGNOSIS — Z131 Encounter for screening for diabetes mellitus: Secondary | ICD-10-CM | POA: Diagnosis not present

## 2018-10-19 ENCOUNTER — Ambulatory Visit: Payer: BC Managed Care – PPO | Admitting: Nutrition

## 2018-10-23 ENCOUNTER — Other Ambulatory Visit: Payer: Self-pay | Admitting: Family Medicine

## 2018-10-23 ENCOUNTER — Encounter: Payer: Self-pay | Admitting: Family Medicine

## 2018-10-23 MED FILL — BD INSULIN SYR 0.3 ML 6MMX3: 31G X 15/64 | 90 days supply | Qty: 300 | Fill #0

## 2018-10-25 ENCOUNTER — Encounter: Payer: 59 | Attending: Internal Medicine | Admitting: Nutrition

## 2018-10-25 ENCOUNTER — Other Ambulatory Visit: Payer: Self-pay

## 2018-10-25 DIAGNOSIS — E1165 Type 2 diabetes mellitus with hyperglycemia: Secondary | ICD-10-CM | POA: Insufficient documentation

## 2018-10-25 DIAGNOSIS — IMO0002 Reserved for concepts with insufficient information to code with codable children: Secondary | ICD-10-CM

## 2018-10-25 DIAGNOSIS — Z6841 Body Mass Index (BMI) 40.0 and over, adult: Secondary | ICD-10-CM | POA: Insufficient documentation

## 2018-10-25 DIAGNOSIS — E118 Type 2 diabetes mellitus with unspecified complications: Secondary | ICD-10-CM | POA: Insufficient documentation

## 2018-10-25 NOTE — Progress Notes (Signed)
First Visit Medical Nutrition Therapy:  Appt start time: 1400 end time:  1500.   Assessment:  Primary concerns today: Type 2 DM, Obesity. Spouse is a Furniture conservator/restorer.  Has been on Metformin. Endocrinology, Dr. Buddy Duty in [place]. Last A1C was 8.1%. 28 units Novolin R.   Has been ordered [place] N 10 units at night. Allergic reaction to [employer].  FBS 150-170's Before lunch 150's-200's Before dinner 90-100's Bedtimes: 100-150's Physical activity: usually was doing [employer] but not recently due to [place]. Swims and walks. Hasn't seen a CDE before. She didn't realize how much she didn't know about how to best manage her DM. Has been avoiding carbs and doing more keto diet with poor results. Wants to lose weight. Was told to eat 5-6 small frequent meals . Current diet is excessive in calories, carbohydrates and low in fresh fruits and lower carb vegetables.  .   Preferred Learning Style:     Visual preference indicated   Learning Readiness:    Ready     MEDICATIONS:    DIETARY INTAKE:  Current 24-hr recall:  B ( AM): Adkins shakes; high protein;  Light yogurt or breakfast sandwich with bagel thins. Doesn't like eggs    Coffee and munkfruit creamer Snk ( AM): crackers or celery peanutbutter or chips. L ( PM): Flatbread pizza,chicken tenders and pickles and zia Snk ( PM): chips and queso or cheese and nuts D ( PM):  shrimp tacos, 3 unsweet tea,  Snk ( PM):sometimes; apple or pb. Beverages: water and zevia Usual physical activity:   Estimated energy needs: 1200 calories 135 g carbohydrates 90 g protein 33g fat  Progress Towards Goal(s):  In progress.   Nutritional Diagnosis:  NB-1.1 Food and nutrition-related knowledge deficit As related to Diabetes Type 2.  As evidenced by A1C 105.    Intervention: Nutrition and Diabetes education provided on My Plate, CHO counting, meal planning, portion sizes, timing of meals, avoiding snacks between meals unless having a low blood sugar,  target ranges for A1C and blood sugars, signs/symptoms and treatment of hyper/hypoglycemia, monitoring blood sugars, taking medications as prescribed, benefits of exercising 30 minutes per day and prevention of complications of DM. Goals Follow My Plate 30-45 grams of carbs per meal. Get A1C down to 7% Lose 1-2 lbs per week Read labels and focus more on Total CHO with meals and not sugars. Exercise:60 minutes 3-4 times per week. Cal Dr. Buddy Duty if having lower BS 2-3 times in a week.  Teaching Method Utilized:  Visual Auditory Hands on  Handouts given during visit include:  emailed My Plate, Diabetes Instructions,  Barriers to learning/adherence to lifestyle change: none  Demonstrated degree of understanding via:  Teach Back   Monitoring/Evaluation:  Dietary intake, exercise, , and body weight in 1 month(s).

## 2018-10-25 NOTE — Patient Instructions (Signed)
Goals Follow My Plate 30-45 grams of carbs per meal. Get A1C down to 7% Lose 1-2 lbs per week Read labels and focus more on Total CHO with meals and not sugars. Exercise:60 minutes 3-4 times per week. Cal Dr. Buddy Duty if having lower BS 2-3 times in a week.

## 2018-10-26 DIAGNOSIS — E1165 Type 2 diabetes mellitus with hyperglycemia: Secondary | ICD-10-CM | POA: Diagnosis not present

## 2018-10-26 DIAGNOSIS — D3502 Benign neoplasm of left adrenal gland: Secondary | ICD-10-CM | POA: Diagnosis not present

## 2018-10-26 DIAGNOSIS — Z5181 Encounter for therapeutic drug level monitoring: Secondary | ICD-10-CM | POA: Diagnosis not present

## 2018-10-26 DIAGNOSIS — Z8639 Personal history of other endocrine, nutritional and metabolic disease: Secondary | ICD-10-CM | POA: Diagnosis not present

## 2018-10-26 MED FILL — ONETOUCH DELICA PLUS LANCET: 67 days supply | Qty: 200 | Fill #0

## 2018-10-26 MED FILL — HumuLIN R 100 UNIT/ML SOLN: 100 | 89 days supply | Qty: 80 | Fill #0

## 2018-10-26 MED FILL — ACCU-CHEK GUIDE TEST STRIP: 90 days supply | Qty: 600 | Fill #0

## 2018-11-04 ENCOUNTER — Telehealth: Payer: 59 | Admitting: Physician Assistant

## 2018-11-04 DIAGNOSIS — R300 Dysuria: Secondary | ICD-10-CM

## 2018-11-04 MED ORDER — NITROFURANTOIN MONOHYD MACRO 100 MG PO CAPS: 100.0000 mg | ORAL_CAPSULE | Freq: Two times a day (BID) | ORAL | 0 refills | Status: DC

## 2018-11-04 NOTE — Progress Notes (Signed)
I have spent 5 minutes in review of e-visit questionnaire, review and updating patient chart, medical decision making and response to patient.   Magalene Mclear Cody Marylynne Keelin, PA-C    

## 2018-11-04 NOTE — Progress Notes (Signed)

## 2018-11-05 DIAGNOSIS — E119 Type 2 diabetes mellitus without complications: Secondary | ICD-10-CM | POA: Diagnosis not present

## 2018-11-05 DIAGNOSIS — L298 Other pruritus: Secondary | ICD-10-CM | POA: Diagnosis not present

## 2018-11-05 DIAGNOSIS — Z79899 Other long term (current) drug therapy: Secondary | ICD-10-CM | POA: Diagnosis not present

## 2018-11-05 DIAGNOSIS — T7840XA Allergy, unspecified, initial encounter: Secondary | ICD-10-CM | POA: Diagnosis not present

## 2018-11-05 DIAGNOSIS — Z794 Long term (current) use of insulin: Secondary | ICD-10-CM | POA: Diagnosis not present

## 2018-11-05 DIAGNOSIS — R0602 Shortness of breath: Secondary | ICD-10-CM | POA: Diagnosis not present

## 2018-11-05 DIAGNOSIS — R251 Tremor, unspecified: Secondary | ICD-10-CM | POA: Diagnosis not present

## 2018-11-05 DIAGNOSIS — T50995A Adverse effect of other drugs, medicaments and biological substances, initial encounter: Secondary | ICD-10-CM | POA: Diagnosis not present

## 2018-11-06 ENCOUNTER — Encounter (HOSPITAL_COMMUNITY): Payer: Self-pay | Admitting: Emergency Medicine

## 2018-11-06 ENCOUNTER — Encounter: Payer: Self-pay | Admitting: Family Medicine

## 2018-11-06 ENCOUNTER — Other Ambulatory Visit: Payer: Self-pay

## 2018-11-06 ENCOUNTER — Emergency Department (HOSPITAL_COMMUNITY)
Admission: EM | Admit: 2018-11-06 | Discharge: 2018-11-06 | Disposition: A | Discharge status: Home / Self Care | Payer: 59 | Attending: Emergency Medicine | Admitting: Emergency Medicine

## 2018-11-06 DIAGNOSIS — R202 Paresthesia of skin: Secondary | ICD-10-CM | POA: Insufficient documentation

## 2018-11-06 DIAGNOSIS — R200 Anesthesia of skin: Secondary | ICD-10-CM | POA: Diagnosis not present

## 2018-11-06 DIAGNOSIS — Z5321 Procedure and treatment not carried out due to patient leaving prior to being seen by health care provider: Secondary | ICD-10-CM | POA: Insufficient documentation

## 2018-11-06 LAB — URINALYSIS, ROUTINE W REFLEX MICROSCOPIC
Bilirubin Urine: NEGATIVE
Glucose, UA: NEGATIVE mg/dL
Hgb urine dipstick: NEGATIVE
Ketones, ur: NEGATIVE mg/dL
Leukocytes,Ua: NEGATIVE
Nitrite: NEGATIVE
Protein, ur: NEGATIVE mg/dL
Specific Gravity, Urine: 1.003 — ABNORMAL LOW (ref 1.005–1.030)
pH: 5 (ref 5.0–8.0)

## 2018-11-06 LAB — POC URINE PREG, ED: Preg Test, Ur: NEGATIVE

## 2018-11-06 LAB — CBG MONITORING, ED: Glucose-Capillary: 171 mg/dL — ABNORMAL HIGH (ref 70–99)

## 2018-11-06 NOTE — ED Notes (Signed)
Pt told registration that she was leaving due to wait time and would follow up with her PCP in the morning.

## 2018-11-06 NOTE — ED Triage Notes (Signed)
Pt reports she had an allergic reaction to Macrobid that was prescribed for a UTI on Sat, was seen and treated for reaction at [facility], pt reports since she has had some numbness/tingling to bilateral wrist (where most of the rash was)

## 2018-11-07 ENCOUNTER — Encounter: Payer: Self-pay | Admitting: Family

## 2018-11-07 ENCOUNTER — Ambulatory Visit: Payer: 59 | Admitting: Family

## 2018-11-07 VITALS — BP 153/94 | HR 90 | Temp 98.6°F | Ht 65.0 in | Wt 273.0 lb

## 2018-11-07 DIAGNOSIS — Z8249 Family history of ischemic heart disease and other diseases of the circulatory system: Secondary | ICD-10-CM | POA: Diagnosis not present

## 2018-11-07 DIAGNOSIS — R079 Chest pain, unspecified: Secondary | ICD-10-CM

## 2018-11-07 DIAGNOSIS — F411 Generalized anxiety disorder: Secondary | ICD-10-CM | POA: Diagnosis not present

## 2018-11-07 DIAGNOSIS — T7840XD Allergy, unspecified, subsequent encounter: Secondary | ICD-10-CM

## 2018-11-07 DIAGNOSIS — I10 Essential (primary) hypertension: Secondary | ICD-10-CM

## 2018-11-07 LAB — CBC WITH DIFFERENTIAL/PLATELET
Basophils Absolute: 0.1 10*3/uL (ref 0.0–0.2)
Basos: 1 %
EOS (ABSOLUTE): 0.1 10*3/uL (ref 0.0–0.4)
Eos: 1 %
Hematocrit: 33.2 % — ABNORMAL LOW (ref 34.0–46.6)
Hemoglobin: 10.5 g/dL — ABNORMAL LOW (ref 11.1–15.9)
Immature Grans (Abs): 0 10*3/uL (ref 0.0–0.1)
Immature Granulocytes: 0 %
Lymphocytes Absolute: 2.3 10*3/uL (ref 0.7–3.1)
Lymphs: 29 %
MCH: 26.5 pg — ABNORMAL LOW (ref 26.6–33.0)
MCHC: 31.6 g/dL (ref 31.5–35.7)
MCV: 84 fL (ref 79–97)
Monocytes Absolute: 0.5 10*3/uL (ref 0.1–0.9)
Monocytes: 7 %
Neutrophils Absolute: 4.8 10*3/uL (ref 1.4–7.0)
Neutrophils: 62 %
Platelets: 476 10*3/uL — ABNORMAL HIGH (ref 150–450)
RBC: 3.96 x10E6/uL (ref 3.77–5.28)
RDW: 12.8 % (ref 11.7–15.4)
WBC: 7.8 10*3/uL (ref 3.4–10.8)

## 2018-11-07 LAB — CMP14+EGFR
ALT: 15 IU/L (ref 0–32)
AST: 11 IU/L (ref 0–40)
Albumin/Globulin Ratio: 1.4 (ref 1.2–2.2)
Albumin: 4.1 g/dL (ref 3.8–4.8)
Alkaline Phosphatase: 57 IU/L (ref 39–117)
BUN/Creatinine Ratio: 17 (ref 9–23)
BUN: 14 mg/dL (ref 6–20)
Bilirubin Total: 0.3 mg/dL (ref 0.0–1.2)
CO2: 22 mmol/L (ref 20–29)
Calcium: 9.5 mg/dL (ref 8.7–10.2)
Chloride: 100 mmol/L (ref 96–106)
Creatinine, Ser: 0.84 mg/dL (ref 0.57–1.00)
GFR calc Af Amer: 103 mL/min/{1.73_m2} (ref >59)
GFR calc non Af Amer: 89 mL/min/{1.73_m2} (ref >59)
Globulin, Total: 2.9 g/dL (ref 1.5–4.5)
Glucose: 202 mg/dL — ABNORMAL HIGH (ref 65–99)
Potassium: 4.4 mmol/L (ref 3.5–5.2)
Sodium: 138 mmol/L (ref 134–144)
Total Protein: 7 g/dL (ref 6.0–8.5)

## 2018-11-07 MED ORDER — BUSPIRONE HCL 7.5 MG PO TABS: 7.5000 mg | ORAL_TABLET | Freq: Three times a day (TID) | ORAL | 2 refills | Status: DC | PRN

## 2018-11-07 NOTE — Patient Instructions (Signed)

## 2018-11-07 NOTE — Progress Notes (Signed)
Subjective:    Patient ID: Amy Ball, female    DOB: 14-Jul-1980, 38 y.o.   MRN: [id number]  Chief Complaint  Patient presents with  . left arm and leg numbness    reaction from nitrofurantin   Pt presents to the office today with complaints left arm and left numbness that started after she started Macrobid on Saturday. She states within 3 hours of taking the Macrobid she had rash on bilateral arms, swelling of bilateral wrist, and  wheezing. She took several benadryl and went to the ED. Her urine was negative.   She states the the numbness in her arm and leg has improved. She reports the numbness in her left leg is resolved, but continues to have intermittent aching pain.   Denies any SOB at this time, but does having intermittent anxiousness feeling.   She states she had a miscarriage in 09/2018 and is unsure if this has increased her anxiety. She reports when she gets "worked up",  Chest Pain  This is a new problem. The current episode started in the past 7 days. The onset quality is sudden. The problem occurs intermittently (when feeling anxious). The problem has been waxing and waning. The pain is at a severity of 2/10. The pain is mild. The quality of the pain is described as pressure. The pain radiates to the left arm. Associated symptoms include weakness. The pain is aggravated by exertion and emotional upset. She has tried rest for the symptoms. The treatment provided moderate relief.      Review of Systems  Cardiovascular: Positive for chest pain.  Neurological: Positive for weakness.  All other systems reviewed and are negative.  Family History  Problem Relation Age of Onset  . Birth defects Mother        one hand small  . Arthritis Father   . Diabetes Father   . Heart disease Father 61       heart attack  . Psoriasis Father   . Psoriasis Brother   . Stroke Paternal Grandmother        age 67  . Cancer Paternal Grandfather        Objective:   Physical  Exam Vitals signs reviewed.  Constitutional:      General: She is not in acute distress.    Appearance: She is well-developed. She is obese.  HENT:     Head: Normocephalic and atraumatic.  Eyes:     Pupils: Pupils are equal, round, and reactive to light.  Neck:     Musculoskeletal: Normal range of motion and neck supple.     Thyroid: No thyromegaly.  Cardiovascular:     Rate and Rhythm: Normal rate and regular rhythm.     Heart sounds: Normal heart sounds. No murmur.  Pulmonary:     Effort: Pulmonary effort is normal. No respiratory distress.     Breath sounds: Normal breath sounds. No wheezing.  Abdominal:     General: Bowel sounds are normal. There is no distension.     Palpations: Abdomen is soft.     Tenderness: There is no abdominal tenderness.  Musculoskeletal: Normal range of motion.        General: No tenderness.  Skin:    General: Skin is warm and dry.  Neurological:     Mental Status: She is alert and oriented to person, place, and time.     Cranial Nerves: No cranial nerve deficit.     Deep Tendon Reflexes: Reflexes are normal and symmetric.  Psychiatric:        Behavior: Behavior normal.        Thought Content: Thought content normal.        Judgment: Judgment normal.       BP (!) 153/94   Pulse 90   Temp 98.6 F (37 C) (Oral)   Ht _0  (1.651 m)   Wt 273 lb (123.8 kg)   LMP 10/20/2018   BMI 45.43 kg/m      Assessment & Plan:  Amy Ball comes in today with chief complaint of left arm and leg numbness (reaction from nitrofurantin)   Diagnosis and orders addressed:  1. Allergic reaction to drug, subsequent encounter - EKG 12-Lead - CMP14+EGFR - CBC with Differential/Platelet  2. Obesity, Class III, BMI 40-49.9 (morbid obesity) (HCC) - EKG 12-Lead - CMP14+EGFR - CBC with Differential/Platelet - Ambulatory referral to Cardiology  3. Chest pain, unspecified type I believe this is related to anxiety given several triggers over the last  month However, since her father had MI when he was 25, I want her to get work up. She has had one about 15 years ago.  - EKG 12-Lead - CMP14+EGFR - CBC with Differential/Platelet - Ambulatory referral to Cardiology  4. GAD (generalized anxiety disorder) Will start Buspar as needed Discussed starting medication while she is at home and not driving - EKG [id number]-XLLI - busPIRone (BUSPAR) 7.5 MG tablet; Take 1 tablet (7.5 mg total) by mouth 3 (three) times daily as needed.  Dispense: 60 tablet; Refill: 2 - CMP14+EGFR - CBC with Differential/Platelet - Ambulatory referral to Cardiology  5. Family history of cardiovascular disorder - Ambulatory referral to Cardiology  6. Essential hypertension States she takes her BP at home and it is normal   Labs pending Diet and exercise encouraged  Follow up plan: With PCP in the next few weeks to re-elevated GAD  Evelina Dun, FNP

## 2018-11-08 ENCOUNTER — Other Ambulatory Visit: Payer: Self-pay

## 2018-11-08 ENCOUNTER — Encounter: Payer: Self-pay | Admitting: Family Medicine

## 2018-11-08 ENCOUNTER — Encounter: Payer: 59 | Attending: Family Medicine | Admitting: Nutrition

## 2018-11-08 ENCOUNTER — Other Ambulatory Visit: Payer: Self-pay | Admitting: *Deleted

## 2018-11-08 ENCOUNTER — Ambulatory Visit (INDEPENDENT_AMBULATORY_CARE_PROVIDER_SITE_OTHER): Payer: 59 | Admitting: Family Medicine

## 2018-11-08 DIAGNOSIS — F411 Generalized anxiety disorder: Secondary | ICD-10-CM | POA: Diagnosis not present

## 2018-11-08 DIAGNOSIS — R030 Elevated blood-pressure reading, without diagnosis of hypertension: Secondary | ICD-10-CM | POA: Diagnosis not present

## 2018-11-08 MED ORDER — PANTOPRAZOLE SODIUM 20 MG PO TBEC: 20.0000 mg | DELAYED_RELEASE_TABLET | Freq: Every day | ORAL | 1 refills | Status: DC

## 2018-11-08 MED FILL — ACCU-CHEK FASTCLIX LANCETS: 68 days supply | Qty: 204 | Fill #0

## 2018-11-08 MED FILL — PANTOPRAZOLE SOD DR 20 MG T: 20 | 90 days supply | Qty: 90 | Fill #0

## 2018-11-08 NOTE — Progress Notes (Signed)
Medical Nutrition Therapy:  Appt start time: 1600 end time:  1615.   Assessment:  Primary concerns today: Type 2 DM, Obesity. Spouse is a Furniture conservator/restorer.  Has been on Metformin. Endocrinology, Dr. Buddy Duty in [place].  Had an allergic reaction to her antibiotic  FBS: 110-140's     Humulin N 10 units at night and will start 28 units of Novolin R Three times a day before meals.   Had a infection and was treated with antibiotics. Is scheduled to cardiologist for heart palpitations and some anxiety. Is having some anxiety due to school starting back and having lost a pregnancy. She feels her BS are much better. Has been more aware of carbs. Wants to get back to cross fit next week. 7 day avg over all  188 mg/dl, 14 day 185 lbs.  CMP Latest Ref Rng & Units 11/07/2018 03/09/2018 03/06/2018  Glucose 65 - 99 mg/dL 202(H) 221(H) 181(H)  BUN 6 - 20 mg/dL 14 19 14   Creatinine 0.57 - 1.00 mg/dL 0.84 0.60 0.59  Sodium 134 - 144 mmol/L 138 136 136  Potassium 3.5 - 5.2 mmol/L 4.4 4.5 4.3  Chloride 96 - 106 mmol/L 100 97 99  CO2 20 - 29 mmol/L 22 20 22   Calcium 8.7 - 10.2 mg/dL 9.5 9.3 9.4  Total Protein 6.0 - 8.5 g/dL 7.0 - -  Total Bilirubin 0.0 - 1.2 mg/dL 0.3 - -  Alkaline Phos 39 - 117 IU/L 57 - -  AST 0 - 40 IU/L 11 - -  ALT 0 - 32 IU/L 15 - -    Preferred Learning Style:     Visual preference indicated   Learning Readiness:    Ready     MEDICATIONS:    DIETARY INTAKE:  24-hr recall:  B ( AM):(FBS 178 )  2 granola and yogurt and 1 slice bacon L) (BS 962)  BBQ tacos and 1 shell, and roasted okra.water D) sweet , grilled chicken, okra, water     Doesn't like eggs    Coffee and munkfruit creamer Snk ( AM): crackers or celery peanutbutter or chips. L ( PM): Flatbread pizza,chicken tenders and pickles and zia Snk ( PM): chips and queso or cheese and nuts D ( PM):  shrimp tacos, 3 unsweet tea,  Snk ( PM):sometimes; apple or pb. Beverages: water and zevia Usual physical activity:    Estimated energy needs: 1200 calories 135 g carbohydrates 90 g protein 33g fat  Progress Towards Goal(s):  In progress.   Nutritional Diagnosis:  NB-1.1 Food and nutrition-related knowledge deficit As related to Diabetes Type 2.  As evidenced by A1C 105.    Intervention: Nutrition and Diabetes education provided on My Plate, CHO counting, meal planning, portion sizes, timing of meals, avoiding snacks between meals unless having a low blood sugar, target ranges for A1C and blood sugars, signs/symptoms and treatment of hyper/hypoglycemia, monitoring blood sugars, taking medications as prescribed, benefits of exercising 30 minutes per day and prevention of complications of DM. Goals Follow My Plate 30-45 grams of carbs per meal. Get A1C down to 7% Lose 1-2 lbs per week Read labels and focus more on Total CHO with meals and not sugars. Exercise:60 minutes 3-4 times per week. Cal Dr. Buddy Duty if having lower BS 2-3 times in a week.  Teaching Method Utilized:  Visual Auditory Hands on  Handouts given during visit include:  The Plate Method   Meal Plan emailed   Barriers to learning/adherence to lifestyle change: none  Demonstrated  degree of understanding via:  Teach Back   Monitoring/Evaluation:  Dietary intake, exercise, , and body weight in 3 month(s).

## 2018-11-08 NOTE — Progress Notes (Signed)
Telephone visit  Subjective: CC: anxiety/ED visit PCP: Janora Norlander, DO MVE:[id number] [name] is a 38 y.o. female calls for telephone consult today. Patient provides verbal consent for consult held via phone.  Location of patient: home Location of provider: Working remotely from home Others present for call: none  1.  ED follow-up Patient was seen in the emergency department after an allergic reaction to [place].  She notes that she has had intermittent ongoing anxiety and chest tightness.  She was seen in the office yesterday had an EKG done and referred to cardiology for stress testing given family history of early cardiovascular disease.  She notes that she has not started the buspirone, which was prescribed yesterday.  She wanted to run it by me to make sure that this was something that was safe to take.  She worries about possible allergic reaction given various allergies to other medications in the past.  She does feel that she needs something for anxiety and actually feels somewhat better that she has something on hand if needed.  She does note some intermittent elevations in blood pressure.  She does attribute this to anxiety state but is going to keep a close eye on her blood pressures for me.   ROS: Per HPI  Allergies  Allergen Reactions  . Amoxicillin-Pot Clavulanate Anaphylaxis  . Macrobid [Nitrofurantoin Macrocrystal] Swelling and Rash  . Basaglar [place] [Insulin Glargine] Swelling  . Avelox [Moxifloxacin Hcl In Nacl] Rash   Past Medical History:  Diagnosis Date  . Allergy    environ allergies  . Diabetes mellitus without complication ([place])    10 years  . GERD (gastroesophageal reflux disease)   . Hypertension     Current Outpatient Medications:  .  Ascorbic Acid (VITAMIN C) 1000 MG tablet, Take 1,000 mg by mouth daily.  , Disp: , Rfl:  .  busPIRone (BUSPAR) 7.5 MG tablet, Take 1 tablet (7.5 mg total) by mouth 3 (three) times daily as needed., Disp: 60 tablet,  Rfl: 2 .  cetirizine (ZYRTEC) 10 MG tablet, Take 10 mg by mouth daily., Disp: , Rfl:  .  fluticasone (FLONASE) 50 MCG/ACT nasal spray, Place 1 spray into both nostrils daily., Disp: 48 g, Rfl: 3 .  insulin regular (NOVOLIN R) 100 units/mL injection, Inject 0.12 mLs (12 Units total) into the skin 3 (three) times daily before meals., Disp: 10 mL, Rfl: 11 .  metFORMIN (GLUCOPHAGE) 1000 MG tablet, TAKE 1 TABLET BY MOUTH TWO TIMES DAILY WITH A MEAL, Disp: 180 tablet, Rfl: 1 .  NOVOFINE 32G X 6 MM MISC, , Disp: , Rfl:  .  nystatin (NYSTATIN) powder, Apply topically 3 (three) times daily. (Patient not taking: Reported on 11/07/2018), Disp: 15 g, Rfl: 0 .  nystatin cream (MYCOSTATIN), Apply 1 application topically 2 (two) times daily. (Patient not taking: Reported on 11/07/2018), Disp: 30 g, Rfl: 0 .  pantoprazole (PROTONIX) 20 MG tablet, TAKE 1 TABLET BY MOUTH ONCE A DAY, Disp: 90 tablet, Rfl: 0 .  Prenatal Vit-Fe Fumarate-FA (PRENATAL VITAMIN PO), Take by mouth., Disp: , Rfl:  .  vitamin B-12 (CYANOCOBALAMIN) 1000 MCG tablet, Take 1,000 mcg by mouth daily., Disp: , Rfl:   Assessment/ Plan: 38 y.o. female   1. Generalized anxiety disorder I reassured her that the BuSpar would most certainly be safe, particularly in pregnancy.  Is a class B medication.  We may need to consider something more long-term like Zoloft or Prozac.  I am not entirely sure that PRN use of  BuSpar would be ideal in this patient as indication is typically scheduled use rather than PRN use.  She will follow-up with me after she is seen cardiology.   2. Elevated blood pressure reading Likely related to ongoing anxiety.  She is to monitor blood pressures at home.  Goal less than 140/90.  If she has persistent elevations, could consider resumption of antihypertensive.  She will keep me informed of these blood pressures.  Start time: 7:59am End time: 8:09am  Total time spent on patient care (including telephone call/ virtual visit): 12  minutes  [place], [place] [phone]

## 2018-11-09 ENCOUNTER — Encounter: Payer: Self-pay | Admitting: Cardiology

## 2018-11-09 ENCOUNTER — Ambulatory Visit (INDEPENDENT_AMBULATORY_CARE_PROVIDER_SITE_OTHER): Payer: 59 | Admitting: Cardiology

## 2018-11-09 ENCOUNTER — Encounter: Payer: Self-pay | Admitting: Family Medicine

## 2018-11-09 VITALS — BP 145/84 | HR 89 | Ht 65.0 in | Wt 273.8 lb

## 2018-11-09 DIAGNOSIS — I209 Angina pectoris, unspecified: Secondary | ICD-10-CM

## 2018-11-09 DIAGNOSIS — Z8249 Family history of ischemic heart disease and other diseases of the circulatory system: Secondary | ICD-10-CM | POA: Diagnosis not present

## 2018-11-09 DIAGNOSIS — I10 Essential (primary) hypertension: Secondary | ICD-10-CM | POA: Diagnosis not present

## 2018-11-09 DIAGNOSIS — E1165 Type 2 diabetes mellitus with hyperglycemia: Secondary | ICD-10-CM | POA: Diagnosis not present

## 2018-11-09 MED ORDER — METOPROLOL TARTRATE 50 MG PO TABS: 50.0000 mg | ORAL_TABLET | Freq: Once | ORAL | 0 refills | Status: DC

## 2018-11-09 NOTE — Progress Notes (Signed)
Cardiology Office Note  Date: 11/09/2018   ID: Amy Ball, DOB 1980-05-29, MRN [id number]  PCP:  Janora Norlander, DO  Consulting Cardiologist:  Rozann Lesches, MD Electrophysiologist:  None   Chief Complaint  Patient presents with  . Cardiac evaluation    History of Present Illness: Amy Ball is a 38 y.o. female referred for cardiology consultation by [name] for evaluation of chest discomfort.  She has a 15-year history of type 2 diabetes mellitus, has been on insulin over the last few months, prior to that long-term oral hypoglycemic therapy.  She describes intermittent episodes of "tightness" in her chest, sometimes in her neck, more recently in her left arm when she had an allergic reaction to [facility] which she was prescribed for treatment of a UTI.  She tends to relate these episodes mostly to emotional upset or anxiety, not necessarily exertion.  These have been intermittent over a period of months.  She has a family history of premature CAD, her father had a heart attack in his early 66s.  Her history includes hypertension, although she is not currently on any antihypertensive therapies.  Blood pressure is elevated as noted below.  I reviewed her recent lab work and ECGs.  She has not had a recent assessment of lipids.  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  She admits that it has been stressful making preparations for the return to this school year in light of COVID-19.  Past Medical History:  Diagnosis Date  . Environmental allergies   . GERD (gastroesophageal reflux disease)   . Hypertension   . Type 2 diabetes mellitus ([facility])    10 years    Past Surgical History:  Procedure Laterality Date  . FRACTURE SURGERY  2007   right ankle    Current Outpatient Medications  Medication Sig Dispense Refill  . Ascorbic Acid (VITAMIN C) 1000 MG tablet Take 1,000 mg by mouth daily.      . busPIRone (BUSPAR) 7.5 MG tablet Take 1 tablet (7.5 mg total)  by mouth 3 (three) times daily as needed. 60 tablet 2  . cetirizine (ZYRTEC) 10 MG tablet Take 10 mg by mouth daily.    . Cyanocobalamin (VITAMIN B-12) 5000 MCG TBDP Take 1 tablet by mouth every other day.    . fluticasone (FLONASE) 50 MCG/ACT nasal spray Place 1 spray into both nostrils daily. 48 g 3  . insulin regular (NOVOLIN R) 100 units/mL injection Inject 0.12 mLs (12 Units total) into the skin 3 (three) times daily before meals. 10 mL 11  . metFORMIN (GLUCOPHAGE) 1000 MG tablet TAKE 1 TABLET BY MOUTH TWO TIMES DAILY WITH A MEAL 180 tablet 1  . NOVOFINE 32G X 6 MM MISC     . pantoprazole (PROTONIX) 20 MG tablet Take 1 tablet (20 mg total) by mouth daily. 90 tablet 1  . Prenatal Vit-Fe Fumarate-FA (PRENATAL VITAMIN PO) Take by mouth.    . metoprolol tartrate (LOPRESSOR) 50 MG tablet Take 1 tablet (50 mg total) by mouth once for 1 dose. One hour before CT scan 1 tablet 0   No current facility-administered medications for this visit.    Allergies:  Amoxicillin-pot clavulanate, Macrobid [nitrofurantoin macrocrystal], Basaglar kwikpen [insulin glargine], and Avelox [moxifloxacin hcl in nacl]   Social History: The patient  reports that she has never smoked. She has never used smokeless tobacco. She reports current alcohol use. She reports that she does not use drugs.   Family History: The patient's  family history includes Arthritis in her father; Birth defects in her mother; Cancer in her paternal grandfather; Diabetes in her father; Heart disease (age of onset: 72) in her father; Psoriasis in her brother and father; Stroke in her paternal grandmother.   ROS:  Please see the history of present illness. Otherwise, complete review of systems is positive for occasional leg swelling, no orthopnea or PND.  All other systems are reviewed and negative.   Physical Exam: VS:  BP (!) 145/84 (BP Location: Right Arm, Cuff Size: Large)   Pulse 89   Ht 5\' 5"  (1.651 m)   Wt 273 lb 12.8 oz (124.2 kg)   LMP  10/20/2018   SpO2 100%   BMI 45.56 kg/m , BMI Body mass index is 45.56 kg/m.  Wt Readings from Last 3 Encounters:  11/09/18 273 lb 12.8 oz (124.2 kg)  11/07/18 273 lb (123.8 kg)  11/06/18 260 lb (117.9 kg)    General: Patient appears comfortable at rest. HEENT: Conjunctiva and lids normal, wearing a mask. Neck: Supple, no elevated JVP or carotid bruits, no thyromegaly. Lungs: Clear to auscultation, nonlabored breathing at rest. Cardiac: Regular rate and rhythm, no S3 or significant systolic murmur, no pericardial rub. Abdomen: Soft, nontender, bowel sounds present, no guarding or rebound. Extremities: No pitting edema, distal pulses 2+. Skin: Warm and dry. Musculoskeletal: No kyphosis. Neuropsychiatric: Alert and oriented x3, affect grossly appropriate.  ECG:  An ECG dated 11/07/2018 was personally reviewed today and demonstrated:  Normal sinus rhythm.  Recent Labwork: 03/09/2018: TSH 3.390 11/07/2018: ALT 15; AST 11; BUN 14; Creatinine, Ser 0.84; Hemoglobin 10.5; Platelets 476; Potassium 4.4; Sodium 138   Other Studies Reviewed Today:  Chest CTA 02/28/2018: FINDINGS: Cardiovascular: There is no evident pulmonary embolus. There is no thoracic aortic aneurysm or dissection. Visualized great vessels appear unremarkable. No pericardial effusion or pericardial thickening evident. Main pulmonary outflow tract is prominent measuring 3.2 cm in diameter.  Mediastinum/Nodes: The left lobe of the thyroid and isthmus are either aplastic or have been removed. Right lobe of the thyroid appears normal. No adenopathy is appreciable. No thyroid lesions are evident.  Lungs/Pleura: Lungs are clear. No evident pleural effusion or pleural thickening.  Upper Abdomen: There Is a 1.1 x 1.0 cm left adrenal adenoma. Visualized upper abdominal structures otherwise are unremarkable.  Musculoskeletal: There are no blastic or lytic bone lesions. No chest wall lesions are evident.  Review of  the MIP images confirms the above findings.  IMPRESSION: 1. No demonstrable pulmonary embolus. No thoracic aortic aneurysm or dissection.  2. Prominence of the main pulmonary outflow tract, a finding indicative of pulmonary arterial hypertension.  3.  Lungs clear.  4.  No thoracic adenopathy evident.  5.  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  6.  Small benign left adrenal adenoma.  Assessment and Plan:  1.  Intermittent chest tightness and recently left arm discomfort, fairly atypical in description although in the setting of longstanding type 2 diabetes mellitus and a family history of premature CAD.  Lipid status is uncertain, there is also a reported history of hypertension.  Recent ECG showed no acute ST-T wave changes.  She has not undergone any prior ischemic evaluations.  Plan is to proceed with a cardiac CTA for further assessment.  2.  Type 2 diabetes mellitus, currently on Glucophage along with insulin which was added within the last few months per PCP.  3.  Reported history of hypertension.  Systolic is in the 962I  today.  She is currently not on any standing antihypertensives.  Weight loss and sodium restriction would be beneficial.  Needs to continue to follow closely with PCP.  ARB would be a consideration in the setting of type 2 diabetes mellitus.  4.  Recommend follow-up lipids with PCP.  Would aim for fairly aggressive LDL control in light of her risk factor profile.  Medication Adjustments/Labs and Tests Ordered: Current medicines are reviewed at length with the patient today.  Concerns regarding medicines are outlined above.   Tests Ordered: Orders Placed This Encounter  Procedures  . CT CORONARY MORPH W/CTA COR W/SCORE W/CA W/CM &/OR WO/CM  . CT CORONARY FRACTIONAL FLOW RESERVE DATA PREP  . CT CORONARY FRACTIONAL FLOW RESERVE FLUID ANALYSIS    Medication Changes: Meds ordered this encounter  Medications  . metoprolol tartrate  (LOPRESSOR) 50 MG tablet    Sig: Take 1 tablet (50 mg total) by mouth once for 1 dose. One hour before CT scan    Dispense:  1 tablet    Refill:  0    Disposition:  Follow up test results.  Signed, Satira Sark, MD, [facility] 11/09/2018 3:28 PM    [facility] [place], [place], [place] [address] Phone: [phone]; Fax: [phone]

## 2018-11-09 NOTE — Patient Instructions (Addendum)
Medication Instructions:   Your physician recommends that you continue on your current medications as directed. Please refer to the Current Medication list given to you today.  Labwork:  NONE  Testing/Procedures: Your physician has requested that you have cardiac CT. Cardiac computed tomography (CT) is a painless test that uses an x-ray machine to take clear, detailed pictures of your heart. For further information please visit [email or web address]. Please follow instruction sheet as given.  Follow-Up:  Your physician recommends that you schedule a follow-up appointment in: pending test result.  Any Other Special Instructions Will Be Listed Below (If Applicable).  If you need a refill on your cardiac medications before your next appointment, please call your pharmacy.  Please arrive at the [facility] main entrance of [facility] at 30-45 minutes prior to test start time)  [facility] [address] [place], [place] [address] [phone]  Proceed to the [facility] Radiology Department (First Floor).  Please follow these instructions carefully (unless otherwise directed):  On the Night Before the Test: . Drink plenty of water. . Do not consume any caffeinated/decaffeinated beverages or chocolate 12 hours prior to your test. . Do not take any antihistamines 12 hours prior to your test. (cetirizine) . If you take Metformin do not take 24 hours prior to test.  Hold novolin insulin morning of test   On the Day of the Test: . Drink plenty of water. Do not drink any water within one hour of the test. . Do not eat any food 4 hours prior to the test. . You may take your regular medications prior to the test. . IF NOT ON A BETA BLOCKER - Take 50 mg of lopressor (metoprolol) one hour before the test. . HOLD Furosemide morning of the test.  After the Test: . Drink plenty of water. . After receiving IV contrast, you may experience a mild flushed feeling. This  is normal. . On occasion, you may experience a mild rash up to 24 hours after the test. This is not dangerous. If this occurs, you can take Benadryl 25 mg and increase your fluid intake. . If you experience trouble breathing, this can be serious. If it is severe call 911 IMMEDIATELY. If it is mild, please call our office. . If you take any of these medications: Glipizide/Metformin, Avandament, Glucavance, please do not take 48 hours after completing test.

## 2018-11-10 MED FILL — FLUTICASONE PROP 50 MCG SPR: 50 | 30 days supply | Qty: 16 | Fill #3

## 2018-11-12 ENCOUNTER — Encounter: Payer: Self-pay | Admitting: Family Medicine

## 2018-11-14 ENCOUNTER — Encounter: Payer: Self-pay | Admitting: Nutrition

## 2018-11-15 ENCOUNTER — Ambulatory Visit: Payer: Self-pay | Admitting: Nutrition

## 2018-11-21 DIAGNOSIS — H5213 Myopia, bilateral: Secondary | ICD-10-CM | POA: Diagnosis not present

## 2018-11-21 DIAGNOSIS — E11319 Type 2 diabetes mellitus with unspecified diabetic retinopathy without macular edema: Secondary | ICD-10-CM | POA: Diagnosis not present

## 2018-11-23 ENCOUNTER — Encounter: Payer: 59 | Admitting: Nutrition

## 2018-11-24 DIAGNOSIS — E113513 Type 2 diabetes mellitus with proliferative diabetic retinopathy with macular edema, bilateral: Secondary | ICD-10-CM | POA: Diagnosis not present

## 2018-11-24 DIAGNOSIS — H3563 Retinal hemorrhage, bilateral: Secondary | ICD-10-CM | POA: Diagnosis not present

## 2018-11-25 LAB — HM DIABETES EYE EXAM

## 2018-11-27 ENCOUNTER — Encounter (INDEPENDENT_AMBULATORY_CARE_PROVIDER_SITE_OTHER): Payer: 59 | Admitting: Ophthalmology

## 2018-11-28 ENCOUNTER — Encounter: Payer: Self-pay | Admitting: Nutrition

## 2018-11-28 NOTE — Patient Instructions (Signed)
Goals Follow My Plate 30-45 grams of carbs per meal. Get A1C down to 7% Lose 1-2 lbs per week Read labels and focus more on Total CHO with meals and not sugars. Exercise:60 minutes 3-4 times per week. Cal Dr. Kerr if having lower BS 2-3 times in a week.  

## 2018-11-29 ENCOUNTER — Encounter: Payer: Self-pay | Admitting: Family Medicine

## 2018-11-29 ENCOUNTER — Other Ambulatory Visit: Payer: Self-pay

## 2018-11-29 ENCOUNTER — Ambulatory Visit (INDEPENDENT_AMBULATORY_CARE_PROVIDER_SITE_OTHER): Payer: 59 | Admitting: Family Medicine

## 2018-11-29 VITALS — BP 165/100 | HR 104 | Temp 98.9°F | Ht 65.0 in | Wt 275.0 lb

## 2018-11-29 DIAGNOSIS — S46912A Strain of unspecified muscle, fascia and tendon at shoulder and upper arm level, left arm, initial encounter: Secondary | ICD-10-CM

## 2018-11-29 MED ORDER — PREDNISONE 10 MG PO TABS: ORAL_TABLET | ORAL | 0 refills | Status: DC

## 2018-11-29 NOTE — Progress Notes (Signed)
Subjective:  Patient ID: Amy Ball, female    DOB: Sep 12, 1980  Age: 38 y.o. MRN: [id number]  CC: Pain in left armpit (Recently seen and saw cardiology and went to ER and was cleared)   HPI Amy Ball presents for awakening at 1 AM today with sharp pain at the left shoulder girdle. Some radiation into the chest. She had similar sx 2 weeks ago and went toE.D. where MI was ruled out (+ fhx)She has stress test coming up as well. She lays on the left side and has a milder pain in the LLE as well. She wonders if it might be anxiety based as well. Made worse by abduction and rotation.   Depression screen [facility] 2/9 11/29/2018 11/07/2018 04/13/2018  Decreased Interest 0 0 0  Down, Depressed, Hopeless 0 0 0  PHQ - 2 Score 0 0 0  Altered sleeping - - 0  Tired, decreased energy - - 0  Change in appetite - - 0  Feeling bad or failure about yourself  - - 0  Trouble concentrating - - 0  Moving slowly or fidgety/restless - - 0  Suicidal thoughts - - 0  PHQ-9 Score - - 0  Difficult doing work/chores - - Not difficult at all    History Amy Ball has a past medical history of Environmental allergies, GERD (gastroesophageal reflux disease), Hypertension, and Type 2 diabetes mellitus ([place]).   She has a past surgical history that includes Fracture surgery (2007).   Her family history includes Arthritis in her father; Birth defects in her mother; Cancer in her paternal grandfather; Diabetes in her father; Heart disease (age of onset: 62) in her father; Psoriasis in her brother and father; Stroke in her paternal grandmother.She reports that she has never smoked. She has never used smokeless tobacco. She reports current alcohol use. She reports that she does not use drugs.    ROS Review of Systems  Constitutional: Negative.   HENT: Negative.   Eyes: Negative for visual disturbance.  Respiratory: Negative for shortness of breath.   Cardiovascular: Positive for chest pain.  Gastrointestinal:  Negative for abdominal pain.  Musculoskeletal: Positive for arthralgias.    Objective:  BP (!) 165/100   Pulse (!) 104   Temp 98.9 F (37.2 C) (Oral)   Ht 5\' 5"  (1.651 m)   Wt 275 lb (124.7 kg)   BMI 45.76 kg/m   BP Readings from Last 3 Encounters:  11/29/18 (!) 165/100  11/09/18 (!) 145/84  11/07/18 (!) 153/94    Wt Readings from Last 3 Encounters:  11/29/18 275 lb (124.7 kg)  11/09/18 273 lb 12.8 oz (124.2 kg)  11/07/18 273 lb (123.8 kg)     Physical Exam Constitutional:      Appearance: She is well-developed.  HENT:     Head: Normocephalic.  Cardiovascular:     Rate and Rhythm: Normal rate and regular rhythm.     Heart sounds: No murmur.  Pulmonary:     Effort: Pulmonary effort is normal.     Breath sounds: Normal breath sounds.  Musculoskeletal:        General: Tenderness (left shoulder girdle) present. No deformity.       Assessment & Plan:   Amy Ball was seen today for pain in left armpit.  Diagnoses and all orders for this visit:  Shoulder strain, left, initial encounter  Other orders -     predniSONE (DELTASONE) 10 MG tablet; Take 5 daily for 2 days followed by 4,3,2 and 1 for 2  days each.       I have discontinued Amy Ball's metoprolol tartrate. I am also having her start on predniSONE. Additionally, I am having her maintain her NovoFine, vitamin C, cetirizine, fluticasone, insulin regular, metFORMIN, Prenatal Vit-Fe Fumarate-FA (PRENATAL VITAMIN PO), busPIRone, pantoprazole, and Vitamin B-12.  Allergies as of 11/29/2018      Reactions   Amoxicillin-pot Clavulanate Anaphylaxis   Macrobid [nitrofurantoin Macrocrystal] Swelling, Rash   Basaglar Kwikpen [insulin Glargine] Swelling   Avelox [moxifloxacin Hcl In Nacl] Rash      Medication List       Accurate as of November 29, 2018 11:59 PM. If you have any questions, ask your nurse or doctor.        STOP taking these medications   metoprolol tartrate 50 MG tablet Commonly known  as: LOPRESSOR Stopped by: Claretta Fraise, MD     TAKE these medications   busPIRone 7.5 MG tablet Commonly known as: BUSPAR Take 1 tablet (7.5 mg total) by mouth 3 (three) times daily as needed.   cetirizine 10 MG tablet Commonly known as: ZYRTEC Take 10 mg by mouth daily.   fluticasone 50 MCG/ACT nasal spray Commonly known as: FLONASE Place 1 spray into both nostrils daily.   insulin regular 100 units/mL injection Commonly known as: NovoLIN R Inject 0.12 mLs (12 Units total) into the skin 3 (three) times daily before meals.   metFORMIN 1000 MG tablet Commonly known as: GLUCOPHAGE TAKE 1 TABLET BY MOUTH TWO TIMES DAILY WITH A MEAL   NovoFine 32G X 6 MM Misc Generic drug: Insulin Pen Needle   pantoprazole 20 MG tablet Commonly known as: PROTONIX Take 1 tablet (20 mg total) by mouth daily.   predniSONE 10 MG tablet Commonly known as: DELTASONE Take 5 daily for 2 days followed by 4,3,2 and 1 for 2 days each. Started by: Claretta Fraise, MD   PRENATAL VITAMIN PO Take by mouth.   Vitamin B-12 5000 MCG Tbdp Take 1 tablet by mouth every other day.   vitamin C 1000 MG tablet Take 1,000 mg by mouth daily.        Follow-up: Return if symptoms worsen or fail to improve.  Claretta Fraise, M.D.

## 2018-11-30 ENCOUNTER — Encounter: Payer: Self-pay | Admitting: Family Medicine

## 2018-12-01 ENCOUNTER — Encounter: Payer: Self-pay | Admitting: Family Medicine

## 2018-12-04 ENCOUNTER — Other Ambulatory Visit: Payer: Self-pay

## 2018-12-05 ENCOUNTER — Encounter: Payer: Self-pay | Admitting: Family Medicine

## 2018-12-05 ENCOUNTER — Ambulatory Visit: Payer: 59 | Admitting: Family Medicine

## 2018-12-05 VITALS — BP 142/81 | HR 95 | Temp 99.0°F | Ht 65.0 in | Wt 276.0 lb

## 2018-12-05 DIAGNOSIS — E1165 Type 2 diabetes mellitus with hyperglycemia: Secondary | ICD-10-CM

## 2018-12-05 DIAGNOSIS — I10 Essential (primary) hypertension: Secondary | ICD-10-CM

## 2018-12-05 DIAGNOSIS — I152 Hypertension secondary to endocrine disorders: Secondary | ICD-10-CM

## 2018-12-05 DIAGNOSIS — F411 Generalized anxiety disorder: Secondary | ICD-10-CM | POA: Diagnosis not present

## 2018-12-05 DIAGNOSIS — E1159 Type 2 diabetes mellitus with other circulatory complications: Secondary | ICD-10-CM

## 2018-12-05 HISTORY — DX: Generalized anxiety disorder: F41.1

## 2018-12-05 MED ORDER — LISINOPRIL 10 MG PO TABS: 10.0000 mg | ORAL_TABLET | Freq: Every day | ORAL | 1 refills | Status: DC

## 2018-12-05 MED ORDER — FLUOXETINE HCL 20 MG PO CAPS: 20.0000 mg | ORAL_CAPSULE | Freq: Every day | ORAL | 1 refills | Status: DC

## 2018-12-05 MED FILL — FLUoxetine HCL 20 MG CAPS: 20 | 30 days supply | Qty: 30 | Fill #0

## 2018-12-05 MED FILL — LISINOPRIL 10 MG TABS: 10 | 90 days supply | Qty: 90 | Fill #0

## 2018-12-05 NOTE — Progress Notes (Signed)
Subjective: CC: Follow-up anxiety/hypertension PCP: Janora Norlander, DO [id number] Amy Ball is a 38 y.o. female presenting to clinic today for:  1.  Generalized anxiety disorder Patient was seen on 11/08/2018 started on BuSpar 7.5 3 times daily.  She notes that she has been compliant with this and does notice an improvement anxiety but continues to have intermittent panic episodes.  She describes the panic episodes as perioral numbness and tingling.  She has had numbness and tingling within the chest and chest pressure as well.  She was evaluated here in the office and again with cardiology for cardiac etiology given family history of early onset cardiovascular disease but was determined to be low risk.  She notes that the cardiologist did mention something about potentially getting a cardiac scan at some point but thought that this could be held off on.  No stress test needed at this time.  Pertaining to her blood pressure she has been off of the labetalol since she had the pedal edema which ultimately was related to use of Basaglar.  She has not been on any antihypertensives since and blood pressures have been elevated.  She would like to go back on lisinopril as she is not planning on starting a family until after the new year.  She was found to have edema at the back of her eye with an associated bleed and is going to begin a series of shots with [place] [facility].  They recommended that she not become pregnant while on the series of shots.  She does not report any chest pain or shortness of breath unless she is having panic symptoms.  No swelling.  Her A1c was 8 during her visit with Dr. Ronita Hipps, her GYN.  This is down from a 9 in July.  She started seeing blood sugars in 100s now with her Novolin and is doing well from a sugar standpoint.   ROS: Per HPI  Allergies  Allergen Reactions  . Amoxicillin-Pot Clavulanate Anaphylaxis  . Macrobid [Nitrofurantoin Macrocrystal] Swelling and Rash  .  Basaglar Claiborne Rigg [Insulin Glargine] Swelling  . Avelox [Moxifloxacin Hcl In Nacl] Rash   Past Medical History:  Diagnosis Date  . Environmental allergies   . GERD (gastroesophageal reflux disease)   . Hypertension   . Type 2 diabetes mellitus (HCC)    10 years    Current Outpatient Medications:  .  Ascorbic Acid (VITAMIN C) 1000 MG tablet, Take 1,000 mg by mouth daily.  , Disp: , Rfl:  .  busPIRone (BUSPAR) 7.5 MG tablet, Take 1 tablet (7.5 mg total) by mouth 3 (three) times daily as needed., Disp: 60 tablet, Rfl: 2 .  cetirizine (ZYRTEC) 10 MG tablet, Take 10 mg by mouth daily., Disp: , Rfl:  .  Cyanocobalamin (VITAMIN B-12) 5000 MCG TBDP, Take 1 tablet by mouth every other day., Disp: , Rfl:  .  fluticasone (FLONASE) 50 MCG/ACT nasal spray, Place 1 spray into both nostrils daily., Disp: 48 g, Rfl: 3 .  insulin regular (NOVOLIN R) 100 units/mL injection, Inject 0.12 mLs (12 Units total) into the skin 3 (three) times daily before meals., Disp: 10 mL, Rfl: 11 .  metFORMIN (GLUCOPHAGE) 1000 MG tablet, TAKE 1 TABLET BY MOUTH TWO TIMES DAILY WITH A MEAL, Disp: 180 tablet, Rfl: 1 .  NOVOFINE 32G X 6 MM MISC, , Disp: , Rfl:  .  pantoprazole (PROTONIX) 20 MG tablet, Take 1 tablet (20 mg total) by mouth daily., Disp: 90 tablet, Rfl: 1 .  predniSONE (  DELTASONE) 10 MG tablet, Take 5 daily for 2 days followed by 4,3,2 and 1 for 2 days each., Disp: 30 tablet, Rfl: 0 .  Prenatal Vit-Fe Fumarate-FA (PRENATAL VITAMIN PO), Take by mouth., Disp: , Rfl:  Social History   Socioeconomic History  . Marital status: Married    Spouse name: Yong Channel  . Number of children: Not on file  . Years of education: 43  . Highest education level: Master's degree (e.g., MA, MS, MEng, MEd, MSW, MBA)  Occupational History  . Occupation: third grade teacher  Social Needs  . Financial resource strain: Not hard at all  . Food insecurity    Worry: Never true    Inability: Never true  . Transportation needs    Medical:  No    Non-medical: No  Tobacco Use  . Smoking status: Never Smoker  . Smokeless tobacco: Never Used  Substance and Sexual Activity  . Alcohol use: Yes    Frequency: Never    Comment: occasionally  . Drug use: No  . Sexual activity: Yes  Lifestyle  . Physical activity    Days per week: 6 days    Minutes per session: 40 min  . Stress: Only a little  Relationships  . Social connections    Talks on phone: More than three times a week    Gets together: More than three times a week    Attends religious service: More than 4 times per year    Active member of club or organization: No    Attends meetings of clubs or organizations: Never    Relationship status: Married  . Intimate partner violence    Fear of current or ex partner: No    Emotionally abused: No    Physically abused: No    Forced sexual activity: No  Other Topics Concern  . Not on file  Social History Narrative   Engaged to be married December 8 to [place]   Family History  Problem Relation Age of Onset  . Birth defects Mother        one hand small  . Arthritis Father   . Diabetes Father   . Heart disease Father 56       heart attack  . Psoriasis Father   . Psoriasis Brother   . Stroke Paternal Grandmother        age 29  . Cancer Paternal Grandfather     Objective: Office vital signs reviewed. BP (!) 142/81   Pulse 95   Temp 99 F (37.2 C) (Temporal)   Ht 5\' 5"  (1.651 m)   Wt 276 lb (125.2 kg)   BMI 45.93 kg/m   Physical Examination:  General: Awake, alert, obese, No acute distress HEENT: Normal, no goiter Cardio: regular rate and rhythm, S1S2 heard, no murmurs appreciated Pulm: clear to auscultation bilaterally, no wheezes, rhonchi or rales; normal work of breathing on room air Extremities: warm, well perfused, No edema, cyanosis or clubbing; +2 pulses bilaterally Skin: dry; intact; no rashes or lesions; normal temperatur Neuro: no tremor Psych: Mood stable, speech normal, affect appropriate,  good eye contact. Depression screen [facility] 2/9 12/05/2018 11/29/2018 11/07/2018  Decreased Interest 0 0 0  Down, Depressed, Hopeless 0 0 0  PHQ - 2 Score 0 0 0  Altered sleeping 0 - -  Tired, decreased energy 0 - -  Change in appetite 0 - -  Feeling bad or failure about yourself  0 - -  Trouble concentrating 1 - -  Moving slowly  or fidgety/restless 0 - -  Suicidal thoughts - - -  PHQ-9 Score 1 - -  Difficult doing work/chores - - -   GAD 7 : Generalized Anxiety Score 12/05/2018  Nervous, Anxious, on Edge 1  Control/stop worrying 1  Worry too much - different things 1  Trouble relaxing 1  Restless 0  Easily annoyed or irritable 0  Afraid - awful might happen 1  Total GAD 7 Score 5  Anxiety Difficulty Somewhat difficult    Assessment/ Plan: 38 y.o. female   1. GAD (generalized anxiety disorder) Ongoing panic episodes.  Start SSRI daily.  We discussed the potential side effects.  Okay to continue buspirone but may consider tapering off of this if she finds good control of anxiety and panic with SSRI alone.  I also considered adding benzodiazepine but I suspect the patient would be reluctant to take this given adversity to medication.  If she has ongoing panic episodes may consider totally discontinuing the buspirone and adding a as needed benzo.  I think patient would be low risk but would certainly have to discontinue that medicine prior to pregnancy. - FLUoxetine (PROZAC) 20 MG capsule; Take 1 capsule (20 mg total) by mouth daily.  Dispense: 30 capsule; Refill: 1  2. Uncontrolled type 2 diabetes mellitus with hyperglycemia (HCC) A1c is improving but still above goal at 8.  She has follow-up with endocrinology in October.  I did ask that she have thyroid tested along with her other labs if possible given ongoing panic and anxiety.  She is had previously abnormal thyroid labs and I wonder if this is contributing. - Bayer DCA Hb A1c Waived  3. Hypertension associated with diabetes ([place]) We  have reinitiated lisinopril 10 mg daily.  She has a labs coming up with her endocrinologist and therefore I will hold off on BMP.  She has previously tolerated ACE inhibitor for years without any renal abnormalities. - lisinopril (ZESTRIL) 10 MG tablet; Take 1 tablet (10 mg total) by mouth daily.  Dispense: 90 tablet; Refill: 1   Orders Placed This Encounter  Procedures  . Bayer DCA Hb A1c Waived   Meds ordered this encounter  Medications  . FLUoxetine (PROZAC) 20 MG capsule    Sig: Take 1 capsule (20 mg total) by mouth daily.    Dispense:  30 capsule    Refill:  1  . lisinopril (ZESTRIL) 10 MG tablet    Sig: Take 1 tablet (10 mg total) by mouth daily.    Dispense:  90 tablet    Refill:  [place], [place] [phone]

## 2018-12-05 NOTE — Patient Instructions (Signed)
We are adding Fluoxetine 20mg  daily.  You can continue the Buspar 3 times daily.  We discussed that once your symptoms are controlled, you could consider tapering off of the Buspar and relying on the Fluoxetine alone.  Have Dr Buddy Duty check thyroid (or if he knows what labs he wants, I can get them at your next visit in 6 weeks when you follow up for mood with me)  We have added Lisinopril back for blood pressure.    Taking the medicine as directed and not missing any doses is one of the best things you can do to treat your depression/ anxiety.  Here are some things to keep in mind:  1) Side effects (stomach upset, some increased anxiety) may happen before you notice a benefit.  These side effects typically go away over time. 2) Changes to your dose of medicine or a change in medication all together is sometimes necessary 3) Most people need to be on medication at least 12 months 4) Many people will notice an improvement within two weeks but the full effect of the medication can take up to 4-6 weeks 5) Stopping the medication when you start feeling better often results in a return of symptoms 6) Never discontinue your medication without contacting a health care professional first.  Some medications require gradual discontinuation/ taper and can make you sick if you stop them abruptly.  If your symptoms worsen or you have thoughts of suicide/homicide, PLEASE SEEK IMMEDIATE MEDICAL ATTENTION.  You may always call:  National Suicide Hotline: 678-488-1675 [place]: 318-568-9941 Crisis Recovery in [place]: 445-871-0559   These are available 24 hours a day, 7 days a week.  Panic Attack A panic attack is a sudden episode of severe anxiety, fear, or discomfort that causes physical and emotional symptoms. The attack may be in response to something frightening, or it may occur for no known reason. Symptoms of a panic attack can be similar to symptoms of a heart attack or stroke.  It is important to see your health care provider when you have a panic attack so that these conditions can be ruled out. A panic attack is a symptom of another condition. Most panic attacks go away with treatment of the underlying problem. If you have panic attacks often, you may have a condition called panic disorder. What are the causes? A panic attack may be caused by:  An extreme, life-threatening situation, such as a war or natural disaster.  An anxiety disorder, such as post-traumatic stress disorder.  Depression.  Certain medical conditions, including heart problems, neurological conditions, and infections.  Certain over-the-counter and prescription medicines.  Illegal drugs that increase heart rate and blood pressure, such as methamphetamine.  Alcohol.  Supplements that increase anxiety.  Panic disorder. What increases the risk? You are more likely to develop this condition if:  You have an anxiety disorder.  You have another mental health condition.  You take certain medicines.  You use alcohol, illegal drugs, or other substances.  You are under extreme stress.  A life event is causing increased feelings of anxiety and depression. What are the signs or symptoms? A panic attack starts suddenly, usually lasts about 20 minutes, and occurs with one or more of the following:  A pounding heart.  A feeling that your heart is beating irregularly or faster than normal (palpitations).  Sweating.  Trembling or shaking.  Shortness of breath or feeling smothered.  Feeling choked.  Chest pain or discomfort.  Nausea or  a strange feeling in your stomach.  Dizziness, feeling lightheaded, or feeling like you might faint.  Chills or hot flashes.  Numbness or tingling in your lips, hands, or feet.  Feeling confused, or feeling that you are not yourself.  Fear of losing control or being emotionally unstable.  Fear of dying. How is this diagnosed? A panic attack  is diagnosed with an assessment by your health care provider. During the assessment your health care provider will ask questions about:  Your history of anxiety, depression, and panic attacks.  Your medical history.  Whether you drink alcohol, use illegal drugs, take supplements, or take medicines. Be honest about your substance use. Your health care provider may also:  Order blood tests or other kinds of tests to rule out serious medical conditions.  Refer you to a mental health professional for further evaluation. How is this treated? Treatment depends on the cause of the panic attack:  If the cause is a medical problem, your health care provider will either treat that problem or refer you to a specialist.  If the cause is emotional, you may be given anti-anxiety medicines or referred to a counselor. These medicines may reduce how often attacks happen, reduce how severe the attacks are, and lower anxiety.  If the cause is a medicine, your health care provider may tell you to stop the medicine, change your dose, or take a different medicine.  If the cause is a drug, treatment may involve letting the drug wear off and taking medicine to help the drug leave your body or to counteract its effects. Attacks caused by drug abuse may continue even if you stop using the drug. Follow these instructions at home:  Take over-the-counter and prescription medicines only as told by your health care provider.  If you feel anxious, limit your caffeine intake.  Take good care of your physical and mental health by: ? Eating a balanced diet that includes plenty of fresh fruits and vegetables, whole grains, lean meats, and low-fat dairy. ? Getting plenty of rest. Try to get 7-8 hours of uninterrupted sleep each night. ? Exercising regularly. Try to get 30 minutes of physical activity at least 5 days a week. ? Not smoking. Talk to your health care provider if you need help quitting. ? Limiting alcohol  intake to no more than 1 drink a day for nonpregnant women and 2 drinks a day for men. One drink equals 12 oz of beer, 5 oz of wine, or 1 oz of hard liquor.  Keep all follow-up visits as told by your health care provider. This is important. Panic attacks may have underlying physical or emotional problems that take time to accurately diagnose. Contact a health care provider if:  Your symptoms do not improve, or they get worse.  You are not able to take your medicine as prescribed because of side effects. Get help right away if:  You have serious thoughts about hurting yourself or others.  You have symptoms of a panic attack. Do not drive yourself to the hospital. Have someone else drive you or call an ambulance. If you ever feel like you may hurt yourself or others, or you have thoughts about taking your own life, get help right away. You can go to your nearest emergency department or call:  Your local emergency services (911 in the [place]  A suicide crisis helpline, such as the [place] at 917-599-7524. This is open 24 hours a day. Summary  A panic  attack is a sign of a serious health or mental health condition. Get help right away. Do not drive yourself to the hospital. Have someone else drive you or call an ambulance.  Always see a health care provider to have the reasons for the panic attack correctly diagnosed.  If your panic attack was caused by a physical problem, follow your health care provider's suggestions for medicine, referral to a specialist, and lifestyle changes.  If your panic attack was caused by an emotional problem, follow through with counseling from a qualified mental health specialist.  If you feel like you may hurt yourself or others, call 911 and get help right away. This information is not intended to replace advice given to you by your health care provider. Make sure you discuss any questions you have with your health care  provider. Document Released: 03/22/2005 Document Revised: 03/04/2017 Document Reviewed: 04/30/2016 Elsevier Patient Education  2020 [employer].

## 2018-12-13 MED FILL — BUSPIRONE HCL 7.5 MG TABS: 7.5 | 20 days supply | Qty: 60 | Fill #0

## 2018-12-18 MED FILL — metFORMIN HCL 1000 MG TABS: 1000 | 90 days supply | Qty: 180 | Fill #0

## 2018-12-19 ENCOUNTER — Ambulatory Visit: Payer: 59 | Admitting: Cardiology

## 2018-12-22 DIAGNOSIS — E113513 Type 2 diabetes mellitus with proliferative diabetic retinopathy with macular edema, bilateral: Secondary | ICD-10-CM | POA: Diagnosis not present

## 2018-12-27 MED FILL — FLUTICASONE PROP 50 MCG SPR: 50 | 90 days supply | Qty: 48 | Fill #4

## 2019-01-02 ENCOUNTER — Ambulatory Visit: Payer: 59 | Admitting: Family Medicine

## 2019-01-02 ENCOUNTER — Encounter: Payer: Self-pay | Admitting: Family Medicine

## 2019-01-02 ENCOUNTER — Other Ambulatory Visit: Payer: Self-pay | Admitting: Family Medicine

## 2019-01-02 MED ORDER — NYSTATIN 100000 UNIT/GM EX CREA: 1.0000 "application " | TOPICAL_CREAM | Freq: Two times a day (BID) | CUTANEOUS | 0 refills | Status: DC

## 2019-01-02 MED ORDER — NYSTATIN 100000 UNIT/GM EX POWD: Freq: Three times a day (TID) | CUTANEOUS | 0 refills | Status: DC

## 2019-01-03 MED FILL — BUSPIRONE HCL 7.5 MG TABS: 7.5 | 20 days supply | Qty: 60 | Fill #1

## 2019-01-05 ENCOUNTER — Encounter: Payer: Self-pay | Admitting: Family Medicine

## 2019-01-11 ENCOUNTER — Encounter: Payer: Self-pay | Admitting: Family Medicine

## 2019-01-11 ENCOUNTER — Other Ambulatory Visit: Payer: Self-pay

## 2019-01-11 DIAGNOSIS — Z20822 Contact with and (suspected) exposure to covid-19: Secondary | ICD-10-CM

## 2019-01-11 DIAGNOSIS — Z20828 Contact with and (suspected) exposure to other viral communicable diseases: Secondary | ICD-10-CM | POA: Diagnosis not present

## 2019-01-12 ENCOUNTER — Telehealth: Payer: Self-pay | Admitting: Family Medicine

## 2019-01-12 ENCOUNTER — Encounter: Payer: Self-pay | Admitting: Family Medicine

## 2019-01-12 NOTE — Telephone Encounter (Signed)
Called left message for patient to call back. MPulliam, CMA/RT(R)

## 2019-01-12 NOTE — Telephone Encounter (Signed)
This was taken care of through MyChart message please see chart. MPulliam, CMA/RT(R)

## 2019-01-13 LAB — NOVEL CORONAVIRUS, NAA: SARS-CoV-2, NAA: DETECTED — AB

## 2019-01-15 ENCOUNTER — Encounter: Payer: Self-pay | Admitting: Family Medicine

## 2019-01-16 ENCOUNTER — Encounter: Payer: Self-pay | Admitting: Family Medicine

## 2019-01-18 ENCOUNTER — Encounter: Payer: Self-pay | Admitting: Family Medicine

## 2019-01-18 ENCOUNTER — Encounter: Payer: 59 | Admitting: Nutrition

## 2019-01-18 ENCOUNTER — Ambulatory Visit (INDEPENDENT_AMBULATORY_CARE_PROVIDER_SITE_OTHER): Payer: 59 | Admitting: Family Medicine

## 2019-01-18 ENCOUNTER — Other Ambulatory Visit: Payer: Self-pay | Admitting: Family Medicine

## 2019-01-18 VITALS — Temp 97.5°F

## 2019-01-18 DIAGNOSIS — F411 Generalized anxiety disorder: Secondary | ICD-10-CM

## 2019-01-18 DIAGNOSIS — I10 Essential (primary) hypertension: Secondary | ICD-10-CM | POA: Diagnosis not present

## 2019-01-18 DIAGNOSIS — U071 COVID-19: Secondary | ICD-10-CM | POA: Diagnosis not present

## 2019-01-18 MED ORDER — BUSPIRONE HCL 10 MG PO TABS: 10.0000 mg | ORAL_TABLET | Freq: Three times a day (TID) | ORAL | 2 refills | Status: DC

## 2019-01-18 MED ORDER — BUSPIRONE HCL 7.5 MG PO TABS: ORAL_TABLET | ORAL | 0 refills | Status: DC

## 2019-01-18 NOTE — Progress Notes (Signed)
Telephone visit  Subjective: CC: Anxiety/blood pressure PCP: Janora Norlander, DO [id number] Amy Ball is a 38 y.o. female calls for telephone consult today. Patient provides verbal consent for consult held via phone.  Location of patient: Home Location of provider: [facility] Others present for call: None  1.  COVID-19 Patient with onset of symptoms October 1.  She never developed a fever.  She still has a mild intermittent residual cough and some drainage.  Overall she is feeling pretty okay except for anxiety somewhat increased due to stressing about having the infection.  She is maintaining symptoms with over-the-counter products, rest and adequate hydration.  She continues to take buspirone 7.5 mg 3 times daily but does feel that she wants to advance this dose given ongoing anxiety.  She never started the Prozac because she read stuff about it affecting blood sugars and vision and was worried about starting it.  She is not checking her blood pressure but notes that she does not feel symptomatic.  No chest pain, dizziness.  She has intermittent headache since Covid but otherwise is feeling okay.   ROS: Per HPI  Allergies  Allergen Reactions  . Amoxicillin-Pot Clavulanate Anaphylaxis  . Macrobid [Nitrofurantoin Macrocrystal] Swelling and Rash  . Basaglar [place] [Insulin Glargine] Swelling  . Avelox [Moxifloxacin Hcl In Nacl] Rash   Past Medical History:  Diagnosis Date  . Environmental allergies   . GAD (generalized anxiety disorder) 12/05/2018  . GERD (gastroesophageal reflux disease)   . Hypertension   . Type 2 diabetes mellitus (HCC)    10 years    Current Outpatient Medications:  .  Ascorbic Acid (VITAMIN C) 1000 MG tablet, Take 1,000 mg by mouth daily.  , Disp: , Rfl:  .  busPIRone (BUSPAR) 7.5 MG tablet, Take 1 tablet (7.5 mg total) by mouth 3 (three) times daily as needed., Disp: 60 tablet, Rfl: 2 .  cetirizine (ZYRTEC) 10 MG tablet, Take 10 mg by mouth daily., Disp: ,  Rfl:  .  Cyanocobalamin (VITAMIN B-12) 5000 MCG TBDP, Take 1 tablet by mouth every other day., Disp: , Rfl:  .  FLUoxetine (PROZAC) 20 MG capsule, Take 1 capsule (20 mg total) by mouth daily., Disp: 30 capsule, Rfl: 1 .  fluticasone (FLONASE) 50 MCG/ACT nasal spray, Place 1 spray into both nostrils daily., Disp: 48 g, Rfl: 3 .  insulin regular (NOVOLIN R) 100 units/mL injection, Inject 0.12 mLs (12 Units total) into the skin 3 (three) times daily before meals. (Patient taking differently: Inject 30 Units into the skin 3 (three) times daily before meals. ), Disp: 10 mL, Rfl: 11 .  lisinopril (ZESTRIL) 10 MG tablet, Take 1 tablet (10 mg total) by mouth daily., Disp: 90 tablet, Rfl: 1 .  metFORMIN (GLUCOPHAGE) 1000 MG tablet, TAKE 1 TABLET BY MOUTH TWO TIMES DAILY WITH A MEAL, Disp: 180 tablet, Rfl: 1 .  NOVOFINE 32G X 6 MM MISC, , Disp: , Rfl:  .  nystatin (MYCOSTATIN/NYSTOP) powder, Apply topically 3 (three) times daily., Disp: 15 g, Rfl: 0 .  nystatin cream (MYCOSTATIN), Apply 1 application topically 2 (two) times daily., Disp: 30 g, Rfl: 0 .  pantoprazole (PROTONIX) 20 MG tablet, Take 1 tablet (20 mg total) by mouth daily., Disp: 90 tablet, Rfl: 1 .  Prenatal Vit-Fe Fumarate-FA (PRENATAL VITAMIN PO), Take by mouth., Disp: , Rfl:   Temperature (!) 97.5 F (36.4 C), SpO2 99 %.  Assessment/ Plan: 38 y.o. female   1. COVID-19 virus infection Remains mildly symptomatic but symptoms  are improving.  I have recommended that she get the influenza vaccine when she has been totally asymptomatic for at least 3 consecutive days and is released from quarantine.  2. GAD (generalized anxiety disorder) Somewhat uncontrolled given stress from COVID-19.  We have increased her to 10 mg 3 times daily.  Advised her to increase to 10 mg every morning and continue the 7.5 mg every afternoon and every evening for at least 3 days then increase to 10 mg twice daily with 7.5 mg in the evenings for 3 days then increase  to 10 mg 3 times daily.  She was good understanding and will follow-up as needed.  For now, holding off on Prozac. - busPIRone (BUSPAR) 10 MG tablet; Take 1 tablet (10 mg total) by mouth 3 (three) times daily.  Dispense: 90 tablet; Refill: 2  3. Essential hypertension Per her report controlled.  Continue current regimen   Start time: 8:21am End time: 8:32am  Total time spent on patient care (including telephone call/ virtual visit): 15 minutes  [place], [place] [phone]

## 2019-01-22 ENCOUNTER — Telehealth: Payer: Self-pay | Admitting: Family Medicine

## 2019-01-22 NOTE — Telephone Encounter (Signed)
Patient was wanting to know if it was ok if she increased the 10mg  dose to the afternoon instead of morning. Advised that would be ok and to let us know if she feels any different. Patient verbalized understanding

## 2019-01-24 ENCOUNTER — Ambulatory Visit (INDEPENDENT_AMBULATORY_CARE_PROVIDER_SITE_OTHER): Payer: 59 | Admitting: Family

## 2019-01-24 ENCOUNTER — Other Ambulatory Visit: Payer: Self-pay

## 2019-01-24 ENCOUNTER — Encounter: Payer: Self-pay | Admitting: Family

## 2019-01-24 DIAGNOSIS — K219 Gastro-esophageal reflux disease without esophagitis: Secondary | ICD-10-CM | POA: Diagnosis not present

## 2019-01-24 DIAGNOSIS — U071 COVID-19: Secondary | ICD-10-CM

## 2019-01-24 DIAGNOSIS — A0839 Other viral enteritis: Secondary | ICD-10-CM | POA: Diagnosis not present

## 2019-01-24 DIAGNOSIS — F411 Generalized anxiety disorder: Secondary | ICD-10-CM | POA: Diagnosis not present

## 2019-01-24 NOTE — Progress Notes (Signed)
Virtual Visit via telephone Note Due to COVID-19 pandemic this visit was conducted virtually. This visit type was conducted due to national recommendations for restrictions regarding the COVID-19 Pandemic (e.g. social distancing, sheltering in place) in an effort to limit this patient's exposure and mitigate transmission in our community. All issues noted in this document were discussed and addressed.  A physical exam was not performed with this format.  I connected with Amy Ball on 01/24/19 at 2:58 pm by telephone and verified that I am speaking with the correct person using two identifiers. Amy Ball is currently located at home and no one is currently with her during visit. The provider, Evelina Dun, FNP is located in their office at time of visit.  I discussed the limitations, risks, security and privacy concerns of performing an evaluation and management service by telephone and the availability of in person appointments. I also discussed with the patient that there may be a patient responsible charge related to this service. The patient expressed understanding and agreed to proceed.   History and Present Illness: Pt calls the office today with diarrhea. Pt was diagnosed with COVID on 01/11/19 and has had intermittent diarrhea since.  She has GERD and taking Protonix 20 mg daily, but states she is having increase heartburn. She has GAD and states she is just anxious about her symptoms lasting this long.  Gastroesophageal Reflux She complains of belching and heartburn. This is a chronic problem. The current episode started more than 1 year ago. The problem occurs frequently. The problem has been waxing and waning. The symptoms are aggravated by certain foods. Risk factors include obesity. She has tried a PPI for the symptoms. The treatment provided mild relief.  Diarrhea  This is a chronic problem. The current episode started more than 1 year ago. The problem occurs less than 2  times per day. The problem has been waxing and waning. The stool consistency is described as watery. Pertinent negatives include no bloating, chills, fever, myalgias or vomiting. She has tried anti-motility drug for the symptoms. The treatment provided mild relief.      Review of Systems  Constitutional: Negative for chills and fever.  Gastrointestinal: Positive for diarrhea and heartburn. Negative for bloating and vomiting.  Musculoskeletal: Negative for myalgias.     Observations/Objective: No SOB or distress noted   Assessment and Plan: 1. COVID-19 virus infection Improving  2. Diarrhea due to COVID-19 Improving Continue daily yogurt and bland diet Force fluids Imodium as needed  3. GAD (generalized anxiety disorder) Continue Buspar as needed  4. Gastroesophageal reflux disease, unspecified whether esophagitis present -Diet discussed- Avoid fried, spicy, citrus foods, caffeine and alcohol -Do not eat 2-3 hours before bedtime -Encouraged small frequent meals -Avoid NSAID's -Can increase Protonix 20 to BID for a few days if symptoms continue then decrease to daily.   Extended her work note   I discussed the assessment and treatment plan with the patient. The patient was provided an opportunity to ask questions and all were answered. The patient agreed with the plan and demonstrated an understanding of the instructions.   The patient was advised to call back or seek an in-person evaluation if the symptoms worsen or if the condition fails to improve as anticipated.  The above assessment and management plan was discussed with the patient. The patient verbalized understanding of and has agreed to the management plan. Patient is aware to call the clinic if symptoms persist or worsen. Patient is aware when to return  to the clinic for a follow-up visit. Patient educated on when it is appropriate to go to the emergency department.   Time call ended:  3:20 pm  I provided 22  minutes of non-face-to-face time during this encounter.    Evelina Dun, FNP

## 2019-01-26 ENCOUNTER — Encounter: Payer: Self-pay | Admitting: Family Medicine

## 2019-01-26 ENCOUNTER — Other Ambulatory Visit: Payer: Self-pay | Admitting: Physician Assistant

## 2019-01-26 MED ORDER — BENZONATATE 100 MG PO CAPS: 100.0000 mg | ORAL_CAPSULE | Freq: Three times a day (TID) | ORAL | 0 refills | Status: DC | PRN

## 2019-02-02 DIAGNOSIS — D3502 Benign neoplasm of left adrenal gland: Secondary | ICD-10-CM | POA: Diagnosis not present

## 2019-02-02 DIAGNOSIS — E113513 Type 2 diabetes mellitus with proliferative diabetic retinopathy with macular edema, bilateral: Secondary | ICD-10-CM | POA: Diagnosis not present

## 2019-02-02 DIAGNOSIS — E1165 Type 2 diabetes mellitus with hyperglycemia: Secondary | ICD-10-CM | POA: Diagnosis not present

## 2019-02-02 MED FILL — PANTOPRAZOLE SOD DR 20 MG T: 20 | 90 days supply | Qty: 90 | Fill #1

## 2019-02-02 MED FILL — HumuLIN R 100 UNIT/ML SOLN: 100 | 89 days supply | Qty: 80 | Fill #1

## 2019-02-02 MED FILL — ACCU-CHEK GUIDE TEST STRIP: 90 days supply | Qty: 600 | Fill #1

## 2019-02-02 MED FILL — BD INSULIN SYR 0.3 ML 6MMX3: 31G X 15/64 | 90 days supply | Qty: 300 | Fill #1

## 2019-02-08 ENCOUNTER — Other Ambulatory Visit: Payer: Self-pay

## 2019-02-09 ENCOUNTER — Encounter: Payer: Self-pay | Admitting: Family Medicine

## 2019-02-09 ENCOUNTER — Ambulatory Visit: Payer: 59 | Admitting: Family Medicine

## 2019-02-09 VITALS — BP 143/87 | HR 98 | Temp 98.4°F | Ht 65.0 in | Wt 285.0 lb

## 2019-02-09 DIAGNOSIS — R61 Generalized hyperhidrosis: Secondary | ICD-10-CM | POA: Diagnosis not present

## 2019-02-09 DIAGNOSIS — F411 Generalized anxiety disorder: Secondary | ICD-10-CM | POA: Diagnosis not present

## 2019-02-09 DIAGNOSIS — K219 Gastro-esophageal reflux disease without esophagitis: Secondary | ICD-10-CM | POA: Diagnosis not present

## 2019-02-09 NOTE — Patient Instructions (Addendum)
For anxiety: Consider increasing your Buspar to 15mg  at bedtime if needed for increase night anxiety.  If you make this change, call me and I will adjust your script.  Remember to ask Dr Buddy Duty about the Fluoxetine affects on blood sugar.  If he needs any labs ordered, let me know.  You can consider increasing the Pantoprazole to 40mg  if needed for acid reflux.  Ok to do for a few days and resume use of normal 20mg  daily if needed.  Night sweats may be from your nigthmares, thyroid, hormone fluctuations, possibly low sugars during sleep.

## 2019-02-09 NOTE — Progress Notes (Signed)
Subjective: CC: Follow-up anxiety PCP: Janora Norlander, DO [id number] Nolt is a 38 y.o. female presenting to clinic today for:  1.  Anxiety At last visit patient was still having some anxiety and therefore her dose of buspirone was increased to 10 mg 3 times daily.  She notes that this has indeed improved quite a bit of her anxiety and panic symptoms.  The chest tightness and tingling that she is experiencing the shoulders has resolved.  However she continues to have nighttime anxiety and affect is been having some nightmares with associated sweating episodes.  Her husband feels that she seems more anxious at nighttime.  She is only been on the 10 mg 3 times daily dose for about a week.  She never started the fluoxetine secondary to concerns for elevation of blood sugars.  She had a recent visit with Dr. Buddy Duty but forgot to ask him about this.  Her blood sugars overall have been looking pretty good and are typically in the low 100s as of late.  She notes having had a heavy menstrual cycle recently and is wondering if this was due to increased stressors.  She also notes increased acid reflux despite use of Protonix 20 mg daily.  She is most certainly not been trying to become pregnant due to the chaos that is gone on this year healthwise and otherwise.  She notes that they will be switching over from in class or to virtual learning soon which she thinks will help with her stress and anxiety.  She notes recurrence of the rash under bilateral arms but this is improving with use of nystatin.  No development of rash underneath the breasts.   ROS: Per HPI  Allergies  Allergen Reactions  . Amoxicillin-Pot Clavulanate Anaphylaxis  . Macrobid [Nitrofurantoin Macrocrystal] Swelling and Rash  . Basaglar Claiborne Rigg [Insulin Glargine] Swelling  . Avelox [Moxifloxacin Hcl In Nacl] Rash   Past Medical History:  Diagnosis Date  . Environmental allergies   . GAD (generalized anxiety disorder) 12/05/2018   . GERD (gastroesophageal reflux disease)   . Hypertension   . Type 2 diabetes mellitus (HCC)    10 years    Current Outpatient Medications:  .  Ascorbic Acid (VITAMIN C) 1000 MG tablet, Take 1,000 mg by mouth daily.  , Disp: , Rfl:  .  busPIRone (BUSPAR) 10 MG tablet, Take 1 tablet (10 mg total) by mouth 3 (three) times daily., Disp: 90 tablet, Rfl: 2 .  cetirizine (ZYRTEC) 10 MG tablet, Take 10 mg by mouth daily., Disp: , Rfl:  .  Cyanocobalamin (VITAMIN B-12) 5000 MCG TBDP, Take 1 tablet by mouth every other day., Disp: , Rfl:  .  fluticasone (FLONASE) 50 MCG/ACT nasal spray, Place 1 spray into both nostrils daily., Disp: 48 g, Rfl: 3 .  insulin regular (NOVOLIN R) 100 units/mL injection, Inject 0.12 mLs (12 Units total) into the skin 3 (three) times daily before meals. (Patient taking differently: Inject 30 Units into the skin 3 (three) times daily before meals. ), Disp: 10 mL, Rfl: 11 .  lisinopril (ZESTRIL) 10 MG tablet, Take 1 tablet (10 mg total) by mouth daily., Disp: 90 tablet, Rfl: 1 .  metFORMIN (GLUCOPHAGE) 1000 MG tablet, TAKE 1 TABLET BY MOUTH TWO TIMES DAILY WITH A MEAL, Disp: 180 tablet, Rfl: 1 .  nystatin (MYCOSTATIN/NYSTOP) powder, Apply topically 3 (three) times daily., Disp: 15 g, Rfl: 0 .  nystatin cream (MYCOSTATIN), Apply 1 application topically 2 (two) times daily., Disp: 30 g,  Rfl: 0 .  pantoprazole (PROTONIX) 20 MG tablet, Take 1 tablet (20 mg total) by mouth daily., Disp: 90 tablet, Rfl: 1 .  Prenatal Vit-Fe Fumarate-FA (PRENATAL VITAMIN PO), Take by mouth., Disp: , Rfl:  .  NOVOFINE 32G X 6 MM MISC, , Disp: , Rfl:  Social History   Socioeconomic History  . Marital status: Married    Spouse name: Yong Channel  . Number of children: Not on file  . Years of education: 1  . Highest education level: Master's degree (e.g., MA, MS, MEng, MEd, MSW, MBA)  Occupational History  . Occupation: third grade teacher  Social Needs  . Financial resource strain: Not hard at all   . Food insecurity    Worry: Never true    Inability: Never true  . Transportation needs    Medical: No    Non-medical: No  Tobacco Use  . Smoking status: Never Smoker  . Smokeless tobacco: Never Used  Substance and Sexual Activity  . Alcohol use: Yes    Frequency: Never    Comment: occasionally  . Drug use: No  . Sexual activity: Yes  Lifestyle  . Physical activity    Days per week: 6 days    Minutes per session: 40 min  . Stress: Only a little  Relationships  . Social connections    Talks on phone: More than three times a week    Gets together: More than three times a week    Attends religious service: More than 4 times per year    Active member of club or organization: No    Attends meetings of clubs or organizations: Never    Relationship status: Married  . Intimate partner violence    Fear of current or ex partner: No    Emotionally abused: No    Physically abused: No    Forced sexual activity: No  Other Topics Concern  . Not on file  Social History Narrative   Engaged to be married December 8 to [place]   Family History  Problem Relation Age of Onset  . Birth defects Mother        one hand small  . Arthritis Father   . Diabetes Father   . Heart disease Father 67       heart attack  . Psoriasis Father   . Psoriasis Brother   . Stroke Paternal Grandmother        age 14  . Cancer Paternal Grandfather     Objective: Office vital signs reviewed. BP (!) 143/87   Pulse 98   Temp 98.4 F (36.9 C) (Temporal)   Ht 5\' 5"  (1.651 m)   Wt 285 lb (129.3 kg)   SpO2 96%   BMI 47.43 kg/m   Physical Examination:  General: Awake, alert, obese, No acute distress HEENT: Normal, sclera white Cardio: regular rate Pulm: normal work of breathing on room air. No wheezing. Psych: Mood slightly anxious but overall stable.  Affect appropriate.  Does not appear to be responding to internal stimuli.  She is pleasant and interactive. Depression screen [facility] 2/9 02/09/2019  12/05/2018 11/29/2018  Decreased Interest 0 0 0  Down, Depressed, Hopeless 0 0 0  PHQ - 2 Score 0 0 0  Altered sleeping 0 0 -  Tired, decreased energy 0 0 -  Change in appetite 0 0 -  Feeling bad or failure about yourself  0 0 -  Trouble concentrating 0 1 -  Moving slowly or fidgety/restless 0 0 -  Suicidal thoughts 0 - -  PHQ-9 Score 0 1 -  Difficult doing work/chores - - -   GAD 7 : Generalized Anxiety Score 02/09/2019 12/05/2018  Nervous, Anxious, on Edge 3 1  Control/stop worrying 3 1  Worry too much - different things 3 1  Trouble relaxing 0 1  Restless 0 0  Easily annoyed or irritable 0 0  Afraid - awful might happen 2 1  Total GAD 7 Score 11 5  Anxiety Difficulty Somewhat difficult Somewhat difficult   Assessment/ Plan: 38 y.o. female   1. Generalized anxiety disorder She has had a slight increase since her last visit but I think this is a combination of recent COVID-19 infection, ongoing stressors with regards to school and work environment.  We discussed consideration for increasing her dose to 15 mg 3 times daily with gradual titration up 5 mg each week.  Because she just has been on the current dose for about 1 week she would like to continue with 3 times daily dosing of 10 mg.  We discussed considering adding the 5 mg at bedtime if she has ongoing anxiety to start with.  She will reach out to her endocrinologist with regards to the fluoxetine as I was not aware that this had a significant impact on blood sugar and thinks that it may give her additional benefit.  2. Night sweats Possibly related to recent illness versus panic and anxiety.  It does not sound like she is having low blood sugars overnight.  We discussed that thyroid may be also affecting as she has had abnormal thyroid levels in the past.  3. Gastroesophageal reflux disease without esophagitis Somewhat exacerbated during stressors.  We discussed may be increasing the Protonix to 40 mg temporarily if she has  ongoing symptoms.  She will follow-up in 3 months, sooner if needed   No orders of the defined types were placed in this encounter.  No orders of the defined types were placed in this encounter.  Total time spent with patient 25 minutes.  Greater than 50% of encounter spent in coordination of care/counseling.   Janora Norlander, DO [place] [phone]

## 2019-02-12 DIAGNOSIS — E113513 Type 2 diabetes mellitus with proliferative diabetic retinopathy with macular edema, bilateral: Secondary | ICD-10-CM | POA: Diagnosis not present

## 2019-02-12 MED FILL — busPIRone HCL 10 MG TABS: 10 | 30 days supply | Qty: 90 | Fill #0

## 2019-02-18 ENCOUNTER — Encounter: Payer: Self-pay | Admitting: Family Medicine

## 2019-02-19 ENCOUNTER — Other Ambulatory Visit: Payer: Self-pay

## 2019-02-19 ENCOUNTER — Encounter: Payer: Self-pay | Admitting: Family Medicine

## 2019-02-19 ENCOUNTER — Ambulatory Visit (INDEPENDENT_AMBULATORY_CARE_PROVIDER_SITE_OTHER): Payer: 59 | Admitting: Family Medicine

## 2019-02-19 DIAGNOSIS — R1011 Right upper quadrant pain: Secondary | ICD-10-CM

## 2019-02-19 DIAGNOSIS — R8290 Unspecified abnormal findings in urine: Secondary | ICD-10-CM

## 2019-02-19 LAB — URINALYSIS, COMPLETE
Bilirubin, UA: NEGATIVE
Ketones, UA: NEGATIVE
Leukocytes,UA: NEGATIVE
Nitrite, UA: NEGATIVE
Protein,UA: NEGATIVE
RBC, UA: NEGATIVE
Specific Gravity, UA: 1.01 (ref 1.005–1.030)
Urobilinogen, Ur: 0.2 mg/dL (ref 0.2–1.0)
pH, UA: 6 (ref 5.0–7.5)

## 2019-02-19 LAB — MICROSCOPIC EXAMINATION
Bacteria, UA: NONE SEEN
RBC, Urine: NONE SEEN /hpf (ref 0–2)
Renal Epithel, UA: NONE SEEN /hpf
WBC, UA: NONE SEEN /hpf (ref 0–5)

## 2019-02-19 NOTE — Progress Notes (Signed)
Virtual Visit via telephone Note Due to COVID-19 pandemic this visit was conducted virtually. This visit type was conducted due to national recommendations for restrictions regarding the COVID-19 Pandemic (e.g. social distancing, sheltering in place) in an effort to limit this patient's exposure and mitigate transmission in our community. All issues noted in this document were discussed and addressed.  A physical exam was not performed with this format.   I connected with Amy Ball on 02/19/2019 at 0900 by telephone and verified that I am speaking with the correct person using two identifiers. Amy Ball is currently located at home and family is currently with them during visit. The provider, Monia Pouch, FNP is located in their office at time of visit.  I discussed the limitations, risks, security and privacy concerns of performing an evaluation and management service by telephone and the availability of in person appointments. I also discussed with the patient that there may be a patient responsible charge related to this service. The patient expressed understanding and agreed to proceed.  Subjective:  Patient ID: Amy Ball, female    DOB: Mar 25, 1981, 38 y.o.   MRN: [id number]  Chief Complaint:  Abdominal Pain   HPI: Amy Ball is a 38 y.o. female presenting on 02/19/2019 for Abdominal Pain   Pt reports intermittent RUQ pain that radiates to her right shoulder. States the pain is worse after heavy meals. She denies nausea or vomiting. States the pain is sharp and colicky, 0000000 at worst. No fever, chills, malaise, or weakness.  She does also report malodorous urine.  States this started several days ago and has not subsided. No flank pain, dysuria, hematuria, or vaginal symptoms.   Abdominal Pain This is a recurrent problem. The current episode started more than 1 month ago. The problem occurs intermittently. The problem has been waxing and waning. The pain is  located in the RUQ and epigastric region. The pain is at a severity of 6/10. The pain is moderate. The quality of the pain is colicky and sharp. The abdominal pain radiates to the right shoulder. Pertinent negatives include no anorexia, arthralgias, belching, constipation, diarrhea, dysuria, fever, flatus, frequency, headaches, hematochezia, hematuria, melena, myalgias, nausea, vomiting or weight loss. The pain is aggravated by eating. The pain is relieved by nothing. She has tried antacids and proton pump inhibitors for the symptoms. The treatment provided mild relief.     Relevant past medical, surgical, family, and social history reviewed and updated as indicated.  Allergies and medications reviewed and updated.   Past Medical History:  Diagnosis Date  . Environmental allergies   . GAD (generalized anxiety disorder) 12/05/2018  . GERD (gastroesophageal reflux disease)   . Hypertension   . Type 2 diabetes mellitus (HCC)    10 years    Past Surgical History:  Procedure Laterality Date  . FRACTURE SURGERY  2007   right ankle    Social History   Socioeconomic History  . Marital status: Married    Spouse name: Yong Channel  . Number of children: Not on file  . Years of education: 69  . Highest education level: Master's degree (e.g., MA, MS, MEng, MEd, MSW, MBA)  Occupational History  . Occupation: third grade teacher  Social Needs  . Financial resource strain: Not hard at all  . Food insecurity    Worry: Never true    Inability: Never true  . Transportation needs    Medical: No    Non-medical: No  Tobacco Use  . Smoking status: Never  Smoker  . Smokeless tobacco: Never Used  Substance and Sexual Activity  . Alcohol use: Yes    Frequency: Never    Comment: occasionally  . Drug use: No  . Sexual activity: Yes  Lifestyle  . Physical activity    Days per week: 6 days    Minutes per session: 40 min  . Stress: Only a little  Relationships  . Social connections    Talks on  phone: More than three times a week    Gets together: More than three times a week    Attends religious service: More than 4 times per year    Active member of club or organization: No    Attends meetings of clubs or organizations: Never    Relationship status: Married  . Intimate partner violence    Fear of current or ex partner: No    Emotionally abused: No    Physically abused: No    Forced sexual activity: No  Other Topics Concern  . Not on file  Social History Narrative   Engaged to be married December 8 to [employer]    Outpatient Encounter Medications as of 02/19/2019  Medication Sig  . Ascorbic Acid (VITAMIN C) 1000 MG tablet Take 1,000 mg by mouth daily.    . busPIRone (BUSPAR) 10 MG tablet Take 1 tablet (10 mg total) by mouth 3 (three) times daily.  . cetirizine (ZYRTEC) 10 MG tablet Take 10 mg by mouth daily.  . Cyanocobalamin (VITAMIN B-12) 5000 MCG TBDP Take 1 tablet by mouth every other day.  . fluticasone (FLONASE) 50 MCG/ACT nasal spray Place 1 spray into both nostrils daily.  . insulin regular (NOVOLIN R) 100 units/mL injection Inject 0.12 mLs (12 Units total) into the skin 3 (three) times daily before meals. (Patient taking differently: Inject 30 Units into the skin 3 (three) times daily before meals. )  . lisinopril (ZESTRIL) 10 MG tablet Take 1 tablet (10 mg total) by mouth daily.  . metFORMIN (GLUCOPHAGE) 1000 MG tablet TAKE 1 TABLET BY MOUTH TWO TIMES DAILY WITH A MEAL  . NOVOFINE 32G X 6 MM MISC   . nystatin (MYCOSTATIN/NYSTOP) powder Apply topically 3 (three) times daily.  Marland Kitchen nystatin cream (MYCOSTATIN) Apply 1 application topically 2 (two) times daily.  . pantoprazole (PROTONIX) 20 MG tablet Take 1 tablet (20 mg total) by mouth daily.  . Prenatal Vit-Fe Fumarate-FA (PRENATAL VITAMIN PO) Take by mouth.   No facility-administered encounter medications on file as of 02/19/2019.     Allergies  Allergen Reactions  . Amoxicillin-Pot Clavulanate Anaphylaxis  .  Macrobid [Nitrofurantoin Macrocrystal] Swelling and Rash  . Basaglar Claiborne Rigg [Insulin Glargine] Swelling  . Avelox [Moxifloxacin Hcl In Nacl] Rash    Review of Systems  Constitutional: Negative for activity change, appetite change, chills, diaphoresis, fatigue, fever, unexpected weight change and weight loss.  HENT: Negative.   Eyes: Negative.   Respiratory: Negative for cough, chest tightness and shortness of breath.   Cardiovascular: Negative for chest pain, palpitations and leg swelling.  Gastrointestinal: Positive for abdominal pain. Negative for abdominal distention, anal bleeding, anorexia, blood in stool, constipation, diarrhea, flatus, hematochezia, melena, nausea, rectal pain and vomiting.  Endocrine: Negative.   Genitourinary: Negative for decreased urine volume, difficulty urinating, dysuria, flank pain, frequency, hematuria, urgency, vaginal bleeding, vaginal discharge and vaginal pain.  Musculoskeletal: Negative for arthralgias and myalgias.  Skin: Negative.  Negative for color change and rash.  Allergic/Immunologic: Negative.   Neurological: Negative for dizziness, weakness and headaches.  Hematological: Negative.   Psychiatric/Behavioral: Negative for confusion, hallucinations, sleep disturbance and suicidal ideas.  All other systems reviewed and are negative.        Observations/Objective: No vital signs or physical exam, this was a telephone or virtual health encounter.  Pt alert and oriented, answers all questions appropriately, and able to speak in full sentences.    Assessment and Plan: Kilyn was seen today for abdominal pain.  Diagnoses and all orders for this visit:  RUQ abdominal pain Reported symptoms concerning for GB sludge or cholecystitis. Will order RUQ [place]. Pt aware of symptomatic care and symptoms that require emergent evaluation and treatment. Report any new or worsening symptoms. Continue PPI.  -     US Abdomen Limited RUQ; Future   Malodorous urine No dysuria, frequency, or urgency. Malodorous urine for several days. Will check below and treat if warranted.  -     Urine culture -     urinalysis- dip and micro     Follow Up Instructions: Return if symptoms worsen or fail to improve.    I discussed the assessment and treatment plan with the patient. The patient was provided an opportunity to ask questions and all were answered. The patient agreed with the plan and demonstrated an understanding of the instructions.   The patient was advised to call back or seek an in-person evaluation if the symptoms worsen or if the condition fails to improve as anticipated.  The above assessment and management plan was discussed with the patient. The patient verbalized understanding of and has agreed to the management plan. Patient is aware to call the clinic if they develop any new symptoms or if symptoms persist or worsen. Patient is aware when to return to the clinic for a follow-up visit. Patient educated on when it is appropriate to go to the emergency department.    I provided 15 minutes of non-face-to-face time during this encounter. The call started at 0900. The call ended at 0915. The other time was used for coordination of care.    Monia Pouch, FNP-C [facility] [address] [place], [place] [address] [phone] 02/19/2019

## 2019-02-20 ENCOUNTER — Encounter: Payer: Self-pay | Admitting: Family Medicine

## 2019-02-21 ENCOUNTER — Other Ambulatory Visit: Payer: Self-pay

## 2019-02-21 ENCOUNTER — Emergency Department (HOSPITAL_COMMUNITY)
Admission: EM | Admit: 2019-02-21 | Discharge: 2019-02-22 | Disposition: A | Discharge status: Home / Self Care | Payer: 59 | Attending: Emergency Medicine | Admitting: Emergency Medicine

## 2019-02-21 ENCOUNTER — Emergency Department (HOSPITAL_COMMUNITY): Payer: 59

## 2019-02-21 ENCOUNTER — Encounter: Payer: Self-pay | Admitting: Family Medicine

## 2019-02-21 ENCOUNTER — Encounter (HOSPITAL_COMMUNITY): Payer: Self-pay | Admitting: Emergency Medicine

## 2019-02-21 DIAGNOSIS — R1011 Right upper quadrant pain: Secondary | ICD-10-CM | POA: Insufficient documentation

## 2019-02-21 DIAGNOSIS — R079 Chest pain, unspecified: Secondary | ICD-10-CM | POA: Diagnosis not present

## 2019-02-21 DIAGNOSIS — R0789 Other chest pain: Secondary | ICD-10-CM | POA: Diagnosis not present

## 2019-02-21 DIAGNOSIS — Z79899 Other long term (current) drug therapy: Secondary | ICD-10-CM | POA: Diagnosis not present

## 2019-02-21 DIAGNOSIS — E119 Type 2 diabetes mellitus without complications: Secondary | ICD-10-CM | POA: Diagnosis not present

## 2019-02-21 DIAGNOSIS — I10 Essential (primary) hypertension: Secondary | ICD-10-CM | POA: Insufficient documentation

## 2019-02-21 DIAGNOSIS — Z7984 Long term (current) use of oral hypoglycemic drugs: Secondary | ICD-10-CM | POA: Diagnosis not present

## 2019-02-21 LAB — BASIC METABOLIC PANEL
Anion gap: 11 (ref 5–15)
BUN: 18 mg/dL (ref 6–20)
CO2: 26 mmol/L (ref 22–32)
Calcium: 9.4 mg/dL (ref 8.9–10.3)
Chloride: 100 mmol/L (ref 98–111)
Creatinine, Ser: 0.53 mg/dL (ref 0.44–1.00)
GFR calc Af Amer: >60 mL/min (ref >60)
GFR calc non Af Amer: >60 mL/min (ref >60)
Glucose, Bld: 117 mg/dL — ABNORMAL HIGH (ref 70–99)
Potassium: 3.6 mmol/L (ref 3.5–5.1)
Sodium: 137 mmol/L (ref 135–145)

## 2019-02-21 LAB — CBC
HCT: 39.9 % (ref 36.0–46.0)
Hemoglobin: 12.8 g/dL (ref 12.0–15.0)
MCH: 28.1 pg (ref 26.0–34.0)
MCHC: 32.1 g/dL (ref 30.0–36.0)
MCV: 87.5 fL (ref 80.0–100.0)
Platelets: 428 10*3/uL — ABNORMAL HIGH (ref 150–400)
RBC: 4.56 MIL/uL (ref 3.87–5.11)
RDW: 15.1 % (ref 11.5–15.5)
WBC: 9.4 10*3/uL (ref 4.0–10.5)
nRBC: 0 % (ref 0.0–0.2)

## 2019-02-21 LAB — URINE CULTURE

## 2019-02-21 LAB — GLUCOSE, CAPILLARY: Glucose-Capillary: 112 mg/dL — ABNORMAL HIGH (ref 70–99)

## 2019-02-21 LAB — TROPONIN I (HIGH SENSITIVITY): Troponin I (High Sensitivity): 2 ng/L (ref <18)

## 2019-02-21 MED ORDER — SODIUM CHLORIDE 0.9% FLUSH: 3.0000 mL | Freq: Once | INTRAVENOUS | Status: DC

## 2019-02-21 NOTE — ED Provider Notes (Signed)
[facility] EMERGENCY DEPARTMENT Provider Note   CSN: [id number] Arrival date & time: 02/21/19  [id number]     History   Chief Complaint Chief Complaint  Patient presents with   Chest Pain    HPI Amy Ball is a 38 y.o. female.     HPI  This is a 38 year old female with a history of reflux, hypertension, diabetes who presents with abdominal and chest pain.  Patient reports that she had COVID-19 at the end of September.  Since that time she reports she has had difficulty getting her reflux under control.  She describes pain after eating that radiates into her chest and sometimes to her back and shoulder.  At times it is described as sharp, dull, and burning.  She increased her Protonix to twice daily with minimal relief.  She then thought that maybe it was her gallbladder.  She has a gallbladder ultrasound scheduled for 9:30 in the morning.  However tonight after eating a large meal, she had recurrence of pain and "shaking" in her epigastrium and is concerned her.  She is not had any nausea, vomiting, fevers.  She does report more loose stools.  She has not had any cough or upper respiratory symptoms.  Past Medical History:  Diagnosis Date   Environmental allergies    GAD (generalized anxiety disorder) 12/05/2018   GERD (gastroesophageal reflux disease)    Hypertension    Type 2 diabetes mellitus ([place])    10 years    Patient Active Problem List   Diagnosis Date Noted   GAD (generalized anxiety disorder) 12/05/2018   Gastroesophageal reflux disease without esophagitis 02/20/2018   Intertriginous candidiasis 02/20/2018   Attempting to conceive 02/20/2018   Obesity, Class III, BMI 40-49.9 (morbid obesity) ([place]) 02/15/2017   Controlled type 2 diabetes mellitus without complication, without long-term current use of insulin ([place]) 02/15/2017   Hypertension associated with diabetes ([place]) 02/15/2017   Environmental allergies 02/15/2017   Knee pain, right 10/01/2010    Gait abnormality 10/01/2010    Past Surgical History:  Procedure Laterality Date   FRACTURE SURGERY  2007   right ankle     OB History   No obstetric history on file.      Home Medications    Prior to Admission medications   Medication Sig Start Date End Date Taking? Authorizing Provider  Ascorbic Acid (VITAMIN C) 1000 MG tablet Take 1,000 mg by mouth daily.      [provider]  busPIRone (BUSPAR) 10 MG tablet Take 1 tablet (10 mg total) by mouth 3 (three) times daily. 01/18/19   Janora Norlander, DO  cetirizine (ZYRTEC) 10 MG tablet Take 10 mg by mouth daily.    [provider]  Cyanocobalamin (VITAMIN B-12) 5000 MCG TBDP Take 1 tablet by mouth every other day.    [provider]  fluticasone (FLONASE) 50 MCG/ACT nasal spray Place 1 spray into both nostrils daily. 07/05/18   Janora Norlander, DO  HYDROcodone-acetaminophen (NORCO/VICODIN) 5-325 MG tablet Take 1-2 tablets by mouth every 6 (six) hours as needed. 02/22/19   Benson Porcaro, Barbette Hair, MD  insulin regular (NOVOLIN R) 100 units/mL injection Inject 0.12 mLs (12 Units total) into the skin 3 (three) times daily before meals. Patient taking differently: Inject 30 Units into the skin 3 (three) times daily before meals.  09/05/18   Janora Norlander, DO  lisinopril (ZESTRIL) 10 MG tablet Take 1 tablet (10 mg total) by mouth daily. 12/05/18   Janora Norlander, DO  metFORMIN (GLUCOPHAGE) 1000 MG tablet TAKE 1 TABLET BY MOUTH TWO TIMES DAILY WITH A MEAL 10/23/18   Ronnie Doss M, DO  NOVOFINE 32G X 6 MM MISC  06/29/10   [provider]  nystatin (MYCOSTATIN/NYSTOP) powder Apply topically 3 (three) times daily. 01/02/19   Janora Norlander, DO  nystatin cream (MYCOSTATIN) Apply 1 application topically 2 (two) times daily. 01/02/19   Janora Norlander, DO  pantoprazole (PROTONIX) 20 MG tablet Take 1 tablet (20 mg total) by mouth daily. 11/08/18   Janora Norlander, DO  Prenatal Vit-Fe  Fumarate-FA (PRENATAL VITAMIN PO) Take by mouth.    [provider]    Family History Family History  Problem Relation Age of Onset   Birth defects Mother        one hand small   Arthritis Father    Diabetes Father    Heart disease Father 55       heart attack   Psoriasis Father    Psoriasis Brother    Stroke Paternal Grandmother        age 48   Cancer Paternal Grandfather     Social History Social History   Tobacco Use   Smoking status: Never Smoker   Smokeless tobacco: Never Used  Substance Use Topics   Alcohol use: Yes    Frequency: Never    Comment: occasionally   Drug use: No     Allergies   Amoxicillin-pot clavulanate, Macrobid [nitrofurantoin macrocrystal], Basaglar kwikpen [insulin glargine], and Avelox [moxifloxacin hcl in nacl]   Review of Systems Review of Systems  Constitutional: Negative for fever.  Respiratory: Negative for shortness of breath.   Cardiovascular: Positive for chest pain.  Gastrointestinal: Positive for abdominal pain and diarrhea. Negative for nausea and vomiting.  Genitourinary: Negative for dysuria.  All other systems reviewed and are negative.    Physical Exam Updated Vital Signs BP (!) 158/106 (BP Location: Right Arm)    Pulse 88    Temp (!) 97.5 F (36.4 C) (Oral)    Resp 20    Ht 1.676 m (5\' 6" )    Wt 127 kg    LMP 02/05/2019    SpO2 100%    BMI 45.19 kg/m   Physical Exam Vitals signs and nursing note reviewed.  Constitutional:      Appearance: She is well-developed. She is obese.  HENT:     Head: Normocephalic and atraumatic.  Eyes:     Pupils: Pupils are equal, round, and reactive to light.  Neck:     Musculoskeletal: Neck supple.  Cardiovascular:     Rate and Rhythm: Normal rate and regular rhythm.     Heart sounds: Normal heart sounds.  Pulmonary:     Effort: Pulmonary effort is normal. No respiratory distress.     Breath sounds: No wheezing.  Abdominal:     General: Bowel sounds are  normal.     Palpations: Abdomen is soft.     Comments: Right upper quadrant and epigastric tenderness to palpation, no rebound or guarding  Musculoskeletal:     Right lower leg: No edema.     Left lower leg: No edema.  Skin:    General: Skin is warm and dry.  Neurological:     Mental Status: She is alert and oriented to person, place, and time.  Psychiatric:        Mood and Affect: Mood normal.      ED Treatments / Results  Labs (all labs ordered are listed,  but only abnormal results are displayed) Labs Reviewed  BASIC METABOLIC PANEL - Abnormal; Notable for the following components:      Result Value   Glucose, Bld 117 (*)    All other components within normal limits  CBC - Abnormal; Notable for the following components:   Platelets 428 (*)    All other components within normal limits  GLUCOSE, CAPILLARY - Abnormal; Notable for the following components:   Glucose-Capillary 112 (*)    All other components within normal limits  URINALYSIS, ROUTINE W REFLEX MICROSCOPIC - Abnormal; Notable for the following components:   Color, Urine COLORLESS (*)    Specific Gravity, Urine 1.004 (*)    All other components within normal limits  HEPATIC FUNCTION PANEL  LIPASE, BLOOD  PREGNANCY, URINE  TROPONIN I (HIGH SENSITIVITY)  TROPONIN I (HIGH SENSITIVITY)    EKG EKG Interpretation  Date/Time:  Wednesday February 21 2019 19:40:13 EST Ventricular Rate:  85 PR Interval:  146 QRS Duration: 80 QT Interval:  370 QTC Calculation: 440 R Axis:   34 Text Interpretation: Normal sinus rhythm Normal ECG Confirmed by Thayer Jew [phone]) on 02/21/2019 10:56:54 PM   Radiology Dg Chest 2 View  Result Date: 02/21/2019 CLINICAL DATA:  Chest pain EXAM: CHEST - 2 VIEW COMPARISON:  04/09/2010 FINDINGS: Heart and mediastinal contours are within normal limits. No focal opacities or effusions. No acute bony abnormality. IMPRESSION: No active cardiopulmonary disease. Electronically Signed    By: Rolm Baptise M.D.   On: 02/21/2019 20:56    Procedures Procedures (including critical care time)  Medications Ordered in ED Medications  sodium chloride flush (NS) 0.9 % injection 3 mL (has no administration in time range)     Initial Impression / Assessment and Plan / ED Course  I have reviewed the triage vital signs and the nursing notes.  Pertinent labs & imaging results that were available during my care of the patient were reviewed by me and considered in my medical decision making (see chart for details).        Patient presents with upper abdominal pain chest pain.  Symptoms are highly suggestive of gallbladder pathology.  Additional considerations include pancreatitis, reflux.  Low suspicion for ACS.  Overall she is nontoxic-appearing and vital signs are reassuring.  She has some tenderness on exam.  EKG without evidence of ischemia.  Chest x-ray without evidence of pneumothorax or pneumonia.  Troponin, lab work including hepatic function and pancreas testing is normal.  She is due for a ultrasound later this morning.  At this time she is pain controlled.  We will discharge her ultrasound this morning.  She was given general surgery follow-up if ultrasound is positive for gallbladder pathology.  After history, exam, and medical workup I feel the patient has been appropriately medically screened and is safe for discharge home. Pertinent diagnoses were discussed with the patient. Patient was given return precautions.   Final Clinical Impressions(s) / ED Diagnoses   Final diagnoses:  RUQ pain    ED Discharge Orders         Ordered    HYDROcodone-acetaminophen (NORCO/VICODIN) 5-325 MG tablet  Every 6 hours PRN     02/22/19 0038           Merryl Hacker, MD 02/22/19 0041

## 2019-02-21 NOTE — ED Triage Notes (Signed)
Pt states she had central chest pain that started 2 hours ago. Pain goes across chest and threw to her back and right shoulder. Pt had covid a month and half ago and she said shes been abdominal/epigastric pain ever since and states she has tests tomorrow for her gallbladder. Pt states she has been on protonix, mylanta, and tums with no relief.

## 2019-02-22 ENCOUNTER — Encounter: Payer: Self-pay | Admitting: Family Medicine

## 2019-02-22 ENCOUNTER — Ambulatory Visit (HOSPITAL_COMMUNITY)
Admission: RE | Admit: 2019-02-22 | Discharge: 2019-02-22 | Disposition: A | Discharge status: Home / Self Care | Payer: 59 | Source: Ambulatory Visit | Attending: Family Medicine | Admitting: Family Medicine

## 2019-02-22 DIAGNOSIS — K7689 Other specified diseases of liver: Secondary | ICD-10-CM | POA: Diagnosis not present

## 2019-02-22 DIAGNOSIS — Z79899 Other long term (current) drug therapy: Secondary | ICD-10-CM | POA: Diagnosis not present

## 2019-02-22 DIAGNOSIS — I10 Essential (primary) hypertension: Secondary | ICD-10-CM | POA: Diagnosis not present

## 2019-02-22 DIAGNOSIS — R0789 Other chest pain: Secondary | ICD-10-CM | POA: Diagnosis not present

## 2019-02-22 DIAGNOSIS — R1011 Right upper quadrant pain: Secondary | ICD-10-CM

## 2019-02-22 DIAGNOSIS — Z7984 Long term (current) use of oral hypoglycemic drugs: Secondary | ICD-10-CM | POA: Diagnosis not present

## 2019-02-22 DIAGNOSIS — E119 Type 2 diabetes mellitus without complications: Secondary | ICD-10-CM | POA: Diagnosis not present

## 2019-02-22 LAB — URINALYSIS, ROUTINE W REFLEX MICROSCOPIC
Bilirubin Urine: NEGATIVE
Glucose, UA: NEGATIVE mg/dL
Hgb urine dipstick: NEGATIVE
Ketones, ur: NEGATIVE mg/dL
Leukocytes,Ua: NEGATIVE
Nitrite: NEGATIVE
Protein, ur: NEGATIVE mg/dL
Specific Gravity, Urine: 1.004 — ABNORMAL LOW (ref 1.005–1.030)
pH: 5 (ref 5.0–8.0)

## 2019-02-22 LAB — HEPATIC FUNCTION PANEL
ALT: 23 U/L (ref 0–44)
AST: 15 U/L (ref 15–41)
Albumin: 3.8 g/dL (ref 3.5–5.0)
Alkaline Phosphatase: 51 U/L (ref 38–126)
Bilirubin, Direct: <0.1 mg/dL (ref 0.0–0.2)
Total Bilirubin: 0.6 mg/dL (ref 0.3–1.2)
Total Protein: 7.6 g/dL (ref 6.5–8.1)

## 2019-02-22 LAB — TROPONIN I (HIGH SENSITIVITY): Troponin I (High Sensitivity): <2 ng/L (ref <18)

## 2019-02-22 LAB — PREGNANCY, URINE: Preg Test, Ur: NEGATIVE

## 2019-02-22 LAB — LIPASE, BLOOD: Lipase: 25 U/L (ref 11–51)

## 2019-02-22 MED ORDER — HYDROCODONE-ACETAMINOPHEN 5-325 MG PO TABS: 1.0000 | ORAL_TABLET | Freq: Four times a day (QID) | ORAL | 0 refills | Status: DC | PRN

## 2019-02-22 MED FILL — HYDROCODON-APAP 5-325: 5-325 | 1 days supply | Qty: 6 | Fill #0

## 2019-02-22 NOTE — Discharge Instructions (Addendum)
You were seen today for abdominal and chest pain.  Suspect this may be related to your gallbladder.  You will be given information about gallstones.  Follow-up for your ultrasound later this morning as scheduled.

## 2019-02-23 ENCOUNTER — Other Ambulatory Visit: Payer: Self-pay | Admitting: Family Medicine

## 2019-02-23 ENCOUNTER — Encounter: Payer: Self-pay | Admitting: Family Medicine

## 2019-02-23 DIAGNOSIS — R1011 Right upper quadrant pain: Secondary | ICD-10-CM

## 2019-02-23 DIAGNOSIS — K760 Fatty (change of) liver, not elsewhere classified: Secondary | ICD-10-CM

## 2019-02-23 NOTE — Telephone Encounter (Signed)
Spoke to patient on phone.

## 2019-02-26 ENCOUNTER — Ambulatory Visit (HOSPITAL_COMMUNITY): Payer: 59

## 2019-02-27 ENCOUNTER — Telehealth: Payer: Self-pay | Admitting: Family Medicine

## 2019-02-27 NOTE — Telephone Encounter (Signed)
If she is in pain, having nausea, fevers, etc.  She should seek immediate medical attention in ED for recheck.  Will also cc courtney to check on GI referral

## 2019-02-27 NOTE — Telephone Encounter (Signed)
Left detailed message on husbands voicemail with instructions. Also advised that we reached out to [facility] and her case is in review so they should be scheduling soon

## 2019-02-27 NOTE — Telephone Encounter (Signed)
Please advise 

## 2019-02-28 ENCOUNTER — Ambulatory Visit (HOSPITAL_COMMUNITY): Payer: 59

## 2019-03-05 MED FILL — LISINOPRIL 10 MG TABS: 10 | 90 days supply | Qty: 90 | Fill #1

## 2019-03-12 DIAGNOSIS — E113513 Type 2 diabetes mellitus with proliferative diabetic retinopathy with macular edema, bilateral: Secondary | ICD-10-CM | POA: Diagnosis not present

## 2019-03-12 DIAGNOSIS — H3563 Retinal hemorrhage, bilateral: Secondary | ICD-10-CM | POA: Diagnosis not present

## 2019-03-19 MED FILL — metFORMIN HCL 1000 MG TABS: 1000 | 90 days supply | Qty: 180 | Fill #1

## 2019-03-23 MED FILL — busPIRone HCL 10 MG TABS: 10 | 30 days supply | Qty: 90 | Fill #1

## 2019-04-02 MED FILL — ACCU-CHEK FASTCLIX LANCETS: 90 days supply | Qty: 306 | Fill #1

## 2019-04-09 DIAGNOSIS — E113513 Type 2 diabetes mellitus with proliferative diabetic retinopathy with macular edema, bilateral: Secondary | ICD-10-CM | POA: Diagnosis not present

## 2019-04-09 DIAGNOSIS — K760 Fatty (change of) liver, not elsewhere classified: Secondary | ICD-10-CM | POA: Diagnosis not present

## 2019-04-09 DIAGNOSIS — K219 Gastro-esophageal reflux disease without esophagitis: Secondary | ICD-10-CM | POA: Diagnosis not present

## 2019-04-16 ENCOUNTER — Other Ambulatory Visit: Payer: Self-pay

## 2019-04-17 ENCOUNTER — Encounter: Payer: Self-pay | Admitting: Family Medicine

## 2019-04-17 ENCOUNTER — Ambulatory Visit (INDEPENDENT_AMBULATORY_CARE_PROVIDER_SITE_OTHER): Payer: 59 | Admitting: Family Medicine

## 2019-04-17 ENCOUNTER — Ambulatory Visit: Payer: 59 | Admitting: Family Medicine

## 2019-04-17 VITALS — BP 128/73 | HR 114 | Temp 98.9°F | Ht 66.0 in | Wt 292.0 lb

## 2019-04-17 DIAGNOSIS — Z1321 Encounter for screening for nutritional disorder: Secondary | ICD-10-CM

## 2019-04-17 DIAGNOSIS — R635 Abnormal weight gain: Secondary | ICD-10-CM

## 2019-04-17 DIAGNOSIS — R6889 Other general symptoms and signs: Secondary | ICD-10-CM

## 2019-04-17 DIAGNOSIS — Z0001 Encounter for general adult medical examination with abnormal findings: Secondary | ICD-10-CM

## 2019-04-17 DIAGNOSIS — N926 Irregular menstruation, unspecified: Secondary | ICD-10-CM

## 2019-04-17 DIAGNOSIS — E1165 Type 2 diabetes mellitus with hyperglycemia: Secondary | ICD-10-CM | POA: Diagnosis not present

## 2019-04-17 DIAGNOSIS — Z0000 Encounter for general adult medical examination without abnormal findings: Secondary | ICD-10-CM | POA: Diagnosis not present

## 2019-04-17 LAB — BAYER DCA HB A1C WAIVED: HB A1C (BAYER DCA - WAIVED): 8.5 % — ABNORMAL HIGH (ref <7.0)

## 2019-04-17 NOTE — Progress Notes (Signed)
Amy Ball is a 39 y.o. female presents to office today for annual physical exam examination.    Concerns today include: 1. Weight gain/ heat intolerance Patient reports that she has gained a little bit of weight and has had some rise in her blood sugars over the last couple of weeks.  Prior to about 3 weeks ago her blood sugars were fairly controlled.  Last A1c was around 8.1 with her endocrinologist.  Over the last 3 weeks however, she seen a rise in the blood sugar and typical blood sugars have been around 1 50-200 with the highest at 380.  This occurred last evening.  She notes that the morning blood sugars tend to be the highest of the day.  She is currently injecting 30 units of Humulin R with each meal.  There was discussion with her endocrinologist previously to possibly start evening insulin but this has not been started yet.  She continues to take Metformin 1000 mg twice daily.  Unable to tolerate basal insulin secondary to adverse side effects.  Additionally, she notes that she had missed her menstrual cycle in the month of December.  She is very consistent in having periods every 28 days but had no period in the month of December.  She is not actively trying to get pregnant because she is undergoing intraocular injections for pressure.  In fact she reports that she has 1 shot left for the left eye and a couple more on the right.  She is currently using ilia injectable.  She has not taken any home pregnancy test.  She did end up having a normal 3-day flow at the beginning of January however.  Occupation: Pharmacist, hospital, Marital status: Married, Substance use: None Diet: Some increased carbs lately due to holidays, Exercise: No longer doing structured exercise but plans to Last eye exam: Up to date Last dental exam: Up-to-date Last pap smear: Has scheduled GYN Wants to wait on Tdap  Past Medical History:  Diagnosis Date  . Environmental allergies   . GAD (generalized anxiety disorder)  12/05/2018  . GERD (gastroesophageal reflux disease)   . Hypertension   . Type 2 diabetes mellitus (HCC)    10 years   Social History   Socioeconomic History  . Marital status: Married    Spouse name: Yong Channel  . Number of children: Not on file  . Years of education: 63  . Highest education level: Master's degree (e.g., MA, MS, MEng, MEd, MSW, MBA)  Occupational History  . Occupation: third grade teacher  Tobacco Use  . Smoking status: Never Smoker  . Smokeless tobacco: Never Used  Substance and Sexual Activity  . Alcohol use: Yes    Comment: occasionally  . Drug use: No  . Sexual activity: Yes  Other Topics Concern  . Not on file  Social History Narrative   Engaged to be married December 8 [place] Determinants of [employer] Strain:   . Difficulty of Paying Living Expenses: Not on file  Food Insecurity:   . Worried About Charity fundraiser in the Last Year: Not on file  . Ran Out of Food in the Last Year: Not on file  Transportation Needs:   . Lack of Transportation (Medical): Not on file  . Lack of Transportation (Non-Medical): Not on file  Physical Activity:   . Days of Exercise per Week: Not on file  . Minutes of Exercise per Session: Not on file  Stress:   .  Feeling of Stress : Not on file  Social Connections:   . Frequency of Communication with Friends and Family: Not on file  . Frequency of Social Gatherings with Friends and Family: Not on file  . Attends Religious Services: Not on file  . Active Member of Clubs or Organizations: Not on file  . Attends Archivist Meetings: Not on file  . Marital Status: Not on file  Intimate Partner Violence:   . Fear of Current or Ex-Partner: Not on file  . Emotionally Abused: Not on file  . Physically Abused: Not on file  . Sexually Abused: Not on file   Past Surgical History:  Procedure Laterality Date  . FRACTURE SURGERY  2007   right ankle   Family History  Problem Relation  Age of Onset  . Birth defects Mother        one hand small  . Arthritis Father   . Diabetes Father   . Heart disease Father 69       heart attack  . Psoriasis Father   . Psoriasis Brother   . Stroke Paternal Grandmother        age 63  . Cancer Paternal Grandfather     Current Outpatient Medications:  .  Ascorbic Acid (VITAMIN C) 1000 MG tablet, Take 1,000 mg by mouth daily.  , Disp: , Rfl:  .  busPIRone (BUSPAR) 10 MG tablet, Take 1 tablet (10 mg total) by mouth 3 (three) times daily., Disp: 90 tablet, Rfl: 2 .  cetirizine (ZYRTEC) 10 MG tablet, Take 10 mg by mouth daily., Disp: , Rfl:  .  Cyanocobalamin (VITAMIN B-12) 5000 MCG TBDP, Take 1 tablet by mouth every other day., Disp: , Rfl:  .  fluticasone (FLONASE) 50 MCG/ACT nasal spray, Place 1 spray into both nostrils daily., Disp: 48 g, Rfl: 3 .  insulin regular (NOVOLIN R) 100 units/mL injection, Inject 30 Units into the skin 3 (three) times daily before meals., Disp: , Rfl:  .  lisinopril (ZESTRIL) 10 MG tablet, Take 1 tablet (10 mg total) by mouth daily., Disp: 90 tablet, Rfl: 1 .  metFORMIN (GLUCOPHAGE) 1000 MG tablet, TAKE 1 TABLET BY MOUTH TWO TIMES DAILY WITH A MEAL, Disp: 180 tablet, Rfl: 1 .  NOVOFINE 32G X 6 MM MISC, , Disp: , Rfl:  .  pantoprazole (PROTONIX) 20 MG tablet, Take 1 tablet (20 mg total) by mouth daily., Disp: 90 tablet, Rfl: 1 .  Prenatal Vit-Fe Fumarate-FA (PRENATAL VITAMIN PO), Take by mouth., Disp: , Rfl:  .  FLUBLOK QUADRIVALENT 0.5 ML injection, , Disp: , Rfl:  .  nystatin (MYCOSTATIN/NYSTOP) powder, Apply topically 3 (three) times daily., Disp: 15 g, Rfl: 0  Allergies  Allergen Reactions  . Amoxicillin-Pot Clavulanate Anaphylaxis  . Macrobid [Nitrofurantoin Macrocrystal] Swelling and Rash  . Basaglar [place] [Insulin Glargine] Swelling  . Avelox [Moxifloxacin Hcl In Nacl] Rash     ROS: Review of Systems Constitutional: positive for sweats Eyes: positive for Increased ocular pressure as above  Ears, nose, mouth, throat, and face: positive for nasal congestion Respiratory: negative Cardiovascular: negative Gastrointestinal: Right upper quadrant abdominal pain has since resolved.  She is to follow-up with GI as needed.  No plans for HIDA scan. Genitourinary:negative Integument/breast: negative Hematologic/lymphatic: negative Musculoskeletal:negative Neurological: negative Behavioral/Psych: Some anxiety but anxiety is under much better control overall Endocrine: negative Allergic/Immunologic: negative    Physical exam BP 128/73   Pulse (!) 114   Temp 98.9 F (37.2 C) (Temporal)   Ht  _0  (1.676 m)   Wt 292 lb (132.5 kg)   BMI 47.13 kg/m  General appearance: alert, cooperative, appears stated age, no distress, morbidly obese and Somewhat sweaty appearing and flushed Head: Normocephalic, without obvious abnormality, atraumatic Eyes: negative findings: lids and lashes normal, conjunctivae and sclerae normal, corneas clear and pupils equal, round, reactive to light and accomodation Ears: normal TM's and external ear canals both ears Nose: Nares normal. Septum midline. Mucosa normal. No drainage or sinus tenderness. Throat: lips, mucosa, and tongue normal; teeth and gums normal Neck: no adenopathy, supple, symmetrical, trachea midline and thyroid not enlarged, symmetric, no tenderness/mass/nodules Back: symmetric, no curvature. ROM normal. No CVA tenderness. Lungs: clear to auscultation bilaterally Heart: regular rate and rhythm, S1, S2 normal, no murmur, click, rub or gallop Abdomen: soft, non-tender; bowel sounds normal; no masses,  no organomegaly and obese Extremities: extremities normal, atraumatic, no cyanosis or edema Pulses: 2+ and symmetric Skin: Flushed.  She has a well-healed scar along the medial ankle.  No other lesions noted Lymph nodes: Cervical, supraclavicular, and axillary nodes normal. Neurologic: Grossly normal patellar DTRs are 1 out of 4 bilaterally  Psych: Mood stable, speech normal, affect appropriate, pleasant and interactive Depression screen [facility] 2/9 04/17/2019 02/09/2019 12/05/2018  Decreased Interest 0 0 0  Down, Depressed, Hopeless 0 0 0  PHQ - 2 Score 0 0 0  Altered sleeping 0 0 0  Tired, decreased energy 0 0 0  Change in appetite 0 0 0  Feeling bad or failure about yourself  0 0 0  Trouble concentrating 0 0 1  Moving slowly or fidgety/restless 0 0 0  Suicidal thoughts 0 0 -  PHQ-9 Score 0 0 1  Difficult doing work/chores - - -   Assessment/ Plan: [place] Ped here for annual physical exam.   1. Annual physical exam Pap to be performed by her GYN.  Tdap deferred.  2. Uncontrolled type 2 diabetes mellitus with hyperglycemia (HCC) A1c is 8.5 today.  Still uncontrolled but better than her previous checks.  I will make sure that her laboratory results are faxed to her endocrinologist at the number provided.  Nonfasting lipid panel obtained today. - hgba1c - CMP14+EGFR - Lipid Panel  3. Missed menses She has had her menstrual cycle this month but did miss last month.  I will check an hcg to ensure that she is not currently pregnant - hCG, quantitative, pregnancy  4. Weight gain Uncertain etiology. - hCG, quantitative, pregnancy - TSH  5. Heat intolerance Check thyroid - TSH  6. Encounter for vitamin deficiency screening I will check vitamin B12 per her endocrinologist request - Vitamin B12  Orders Placed This Encounter  Procedures  . hgba1c  . hCG, quantitative, pregnancy  . TSH  . CMP14+EGFR  . Lipid Panel  . Vitamin B12   Counseled on healthy lifestyle choices, including diet (rich in fruits, vegetables and lean meats and low in salt and simple carbohydrates) and exercise (at least 30 minutes of moderate physical activity daily).  Patient to follow up in 1 year for annual exam or sooner if needed.   M. Lajuana Ripple, DO

## 2019-04-18 ENCOUNTER — Other Ambulatory Visit: Payer: Self-pay | Admitting: Family Medicine

## 2019-04-18 LAB — LIPID PANEL
Chol/HDL Ratio: 5.1 ratio — ABNORMAL HIGH (ref 0.0–4.4)
Cholesterol, Total: 180 mg/dL (ref 100–199)
HDL: 35 mg/dL — ABNORMAL LOW (ref >39)
LDL Chol Calc (NIH): 69 mg/dL (ref 0–99)
Triglycerides: 490 mg/dL — ABNORMAL HIGH (ref 0–149)
VLDL Cholesterol Cal: 76 mg/dL — ABNORMAL HIGH (ref 5–40)

## 2019-04-18 LAB — CMP14+EGFR
ALT: 28 IU/L (ref 0–32)
AST: 19 IU/L (ref 0–40)
Albumin/Globulin Ratio: 1.6 (ref 1.2–2.2)
Albumin: 4.4 g/dL (ref 3.8–4.8)
Alkaline Phosphatase: 75 IU/L (ref 39–117)
BUN/Creatinine Ratio: 22 (ref 9–23)
BUN: 14 mg/dL (ref 6–20)
Bilirubin Total: <0.2 mg/dL (ref 0.0–1.2)
CO2: 21 mmol/L (ref 20–29)
Calcium: 9.5 mg/dL (ref 8.7–10.2)
Chloride: 99 mmol/L (ref 96–106)
Creatinine, Ser: 0.63 mg/dL (ref 0.57–1.00)
GFR calc Af Amer: 132 mL/min/{1.73_m2} (ref >59)
GFR calc non Af Amer: 114 mL/min/{1.73_m2} (ref >59)
Globulin, Total: 2.8 g/dL (ref 1.5–4.5)
Glucose: 217 mg/dL — ABNORMAL HIGH (ref 65–99)
Potassium: 4.7 mmol/L (ref 3.5–5.2)
Sodium: 136 mmol/L (ref 134–144)
Total Protein: 7.2 g/dL (ref 6.0–8.5)

## 2019-04-18 LAB — BETA HCG QUANT (REF LAB): hCG Quant: <1 m[IU]/mL

## 2019-04-18 LAB — TSH: TSH: 2.37 u[IU]/mL (ref 0.450–4.500)

## 2019-04-18 LAB — VITAMIN B12: Vitamin B-12: 1602 pg/mL — ABNORMAL HIGH (ref 232–1245)

## 2019-04-19 MED FILL — FLUTICASONE PROP 50 MCG SPR: 50 | 90 days supply | Qty: 48 | Fill #5

## 2019-04-23 MED FILL — PANTOPRAZOLE SOD DR 20 MG T: 20 | 90 days supply | Qty: 90 | Fill #0

## 2019-04-25 MED FILL — FREESTYLE LITE TEST STRIP: 90 days supply | Qty: 300 | Fill #0

## 2019-04-25 MED FILL — FREESTYLE LITE METER: 30 days supply | Qty: 1 | Fill #0

## 2019-04-26 MED FILL — BD INSULIN SYR 0.3 ML 6MMX3: 31G X 15/64 | 90 days supply | Qty: 300 | Fill #2

## 2019-04-26 MED FILL — HumuLIN R 100 UNIT/ML SOLN: 100 | 89 days supply | Qty: 80 | Fill #2

## 2019-04-30 ENCOUNTER — Other Ambulatory Visit: Payer: Self-pay | Admitting: Family Medicine

## 2019-04-30 ENCOUNTER — Encounter: Payer: Self-pay | Admitting: Family Medicine

## 2019-04-30 DIAGNOSIS — F411 Generalized anxiety disorder: Secondary | ICD-10-CM

## 2019-04-30 MED ORDER — BUSPIRONE HCL 30 MG PO TABS: 30.0000 mg | ORAL_TABLET | Freq: Every day | ORAL | 2 refills | Status: DC

## 2019-04-30 MED FILL — BUSPIRONE HCL 30 MG TABS: 30 | 90 days supply | Qty: 90 | Fill #0

## 2019-05-04 ENCOUNTER — Telehealth: Payer: Self-pay | Admitting: Family Medicine

## 2019-05-04 ENCOUNTER — Encounter: Payer: Self-pay | Admitting: Family Medicine

## 2019-05-04 MED ORDER — BUSPIRONE HCL 10 MG PO TABS: 10.0000 mg | ORAL_TABLET | Freq: Three times a day (TID) | ORAL | 0 refills | Status: DC

## 2019-05-04 NOTE — Telephone Encounter (Signed)
Pt aware that new dose was sent to pharmacy

## 2019-05-04 NOTE — Telephone Encounter (Signed)
I did see the patient advice request and said for her to go ahead and go back to her previous dosing and stop the 30 mg and take the 10 3 times a day for now instead.

## 2019-05-04 NOTE — Telephone Encounter (Signed)
I am so confused by this, I hope you understand it. This message along with her other 3 my chart message. Help!

## 2019-05-10 ENCOUNTER — Encounter: Payer: Self-pay | Admitting: Family Medicine

## 2019-05-10 ENCOUNTER — Other Ambulatory Visit: Payer: Self-pay | Admitting: Family Medicine

## 2019-05-10 MED ORDER — BUSPIRONE HCL 10 MG PO TABS: 10.0000 mg | ORAL_TABLET | Freq: Three times a day (TID) | ORAL | 5 refills | Status: DC

## 2019-05-11 DIAGNOSIS — E113513 Type 2 diabetes mellitus with proliferative diabetic retinopathy with macular edema, bilateral: Secondary | ICD-10-CM | POA: Diagnosis not present

## 2019-05-12 ENCOUNTER — Encounter: Payer: Self-pay | Admitting: Family Medicine

## 2019-05-12 ENCOUNTER — Other Ambulatory Visit: Payer: Self-pay | Admitting: Family Medicine

## 2019-05-15 MED FILL — busPIRone HCL 10 MG TABS: 10 | 30 days supply | Qty: 90 | Fill #0

## 2019-05-16 ENCOUNTER — Other Ambulatory Visit: Payer: Self-pay

## 2019-05-17 ENCOUNTER — Telehealth: Payer: 59 | Admitting: Family

## 2019-05-17 ENCOUNTER — Ambulatory Visit: Payer: 59 | Admitting: Family

## 2019-05-18 ENCOUNTER — Other Ambulatory Visit: Payer: Self-pay

## 2019-05-18 ENCOUNTER — Encounter: Payer: Self-pay | Admitting: Family Medicine

## 2019-05-18 ENCOUNTER — Ambulatory Visit: Payer: 59 | Admitting: Family Medicine

## 2019-05-18 VITALS — BP 151/89 | HR 105 | Temp 99.3°F | Ht 66.0 in | Wt 300.0 lb

## 2019-05-18 DIAGNOSIS — S134XXA Sprain of ligaments of cervical spine, initial encounter: Secondary | ICD-10-CM | POA: Diagnosis not present

## 2019-05-18 DIAGNOSIS — R609 Edema, unspecified: Secondary | ICD-10-CM | POA: Diagnosis not present

## 2019-05-18 DIAGNOSIS — S233XXA Sprain of ligaments of thoracic spine, initial encounter: Secondary | ICD-10-CM | POA: Diagnosis not present

## 2019-05-18 DIAGNOSIS — S338XXA Sprain of other parts of lumbar spine and pelvis, initial encounter: Secondary | ICD-10-CM | POA: Diagnosis not present

## 2019-05-18 DIAGNOSIS — I10 Essential (primary) hypertension: Secondary | ICD-10-CM | POA: Diagnosis not present

## 2019-05-18 MED ORDER — HYDROCHLOROTHIAZIDE 12.5 MG PO TABS: 12.5000 mg | ORAL_TABLET | Freq: Every day | ORAL | 0 refills | Status: DC

## 2019-05-18 MED FILL — HYDROCHLOROTHIAZIDE 12.5 MG: 12.5 | 90 days supply | Qty: 90 | Fill #0

## 2019-05-18 NOTE — Patient Instructions (Signed)
Edema  Edema is when you have too much fluid in your body or under your skin. Edema may make your legs, feet, and ankles swell up. Swelling is also common in looser tissues, like around your eyes. This is a common condition. It gets more common as you get older. There are many possible causes of edema. Eating too much salt (sodium) and being on your feet or sitting for a long time can cause edema in your legs, feet, and ankles. Hot weather may make edema worse. Edema is usually painless. Your skin may look swollen or shiny. Follow these instructions at home:  Keep the swollen body part raised (elevated) above the level of your heart when you are sitting or lying down.  Do not sit still or stand for a long time.  Do not wear tight clothes. Do not wear garters on your upper legs.  Exercise your legs. This can help the swelling go down.  Wear elastic bandages or support stockings as told by your doctor.  Eat a low-salt (low-sodium) diet to reduce fluid as told by your doctor.  Depending on the cause of your swelling, you may need to limit how much fluid you drink (fluid restriction).  Take over-the-counter and prescription medicines only as told by your doctor. Contact a doctor if:  Treatment is not working.  You have heart, liver, or kidney disease and have symptoms of edema.  You have sudden and unexplained weight gain. Get help right away if:  You have shortness of breath or chest pain.  You cannot breathe when you lie down.  You have pain, redness, or warmth in the swollen areas.  You have heart, liver, or kidney disease and get edema all of a sudden.  You have a fever and your symptoms get worse all of a sudden. Summary  Edema is when you have too much fluid in your body or under your skin.  Edema may make your legs, feet, and ankles swell up. Swelling is also common in looser tissues, like around your eyes.  Raise (elevate) the swollen body part above the level of your  heart when you are sitting or lying down.  Follow your doctor's instructions about diet and how much fluid you can drink (fluid restriction). This information is not intended to replace advice given to you by your health care provider. Make sure you discuss any questions you have with your health care provider. Document Revised: 03/25/2017 Document Reviewed: 04/09/2016 Elsevier Patient Education  2020 [employer]  

## 2019-05-18 NOTE — Progress Notes (Signed)
Subjective: [name] PCP: Janora Norlander, DO [id number] Amy Ball is a 39 y.o. female presenting to clinic today for:  1. Edema/hypertension Patient reports that she developed quite severe edema in bilateral lower extremities with left greater than right on Wednesday.  She notes that she had been sitting down in a chair at work for several hours for various meetings.  She tried to elevate her legs and it did improve some but when she resumed her other position the swelling got worse again.  She has the following morning she diuresed quite a bit and overall felt better.  She reports compliance with lisinopril but still has elevated blood pressures above 0000000 systolic.  No chest pain.  Sometimes she has shortness of breath with moderate exertion but otherwise tolerates activity without difficulty.  No orthopnea.  She admits to snoring but has not been observed pausing in her breathing.  She does note that her neck girth has increased over the last year as well as her body weight.  This is despite modification of diet and trying to increase physical activity.  Of note, her insulin has been titrated up over the last year.   ROS: Per HPI  Allergies  Allergen Reactions  . Amoxicillin-Pot Clavulanate Anaphylaxis  . Macrobid [Nitrofurantoin Macrocrystal] Swelling and Rash  . Basaglar Claiborne Rigg [Insulin Glargine] Swelling  . Avelox [Moxifloxacin Hcl In Nacl] Rash   Past Medical History:  Diagnosis Date  . Environmental allergies   . GAD (generalized anxiety disorder) 12/05/2018  . GERD (gastroesophageal reflux disease)   . Hypertension   . Type 2 diabetes mellitus (HCC)    10 years    Current Outpatient Medications:  .  Ascorbic Acid (VITAMIN C) 1000 MG tablet, Take 1,000 mg by mouth daily.  , Disp: , Rfl:  .  busPIRone (BUSPAR) 10 MG tablet, TAKE 1 TABLET BY MOUTH THREE TIMES A DAY, Disp: 270 tablet, Rfl: 2 .  cetirizine (ZYRTEC) 10 MG tablet, Take 10 mg by mouth daily., Disp: , Rfl:  .   Cyanocobalamin (VITAMIN B-12) 5000 MCG TBDP, Take 1 tablet by mouth every other day., Disp: , Rfl:  .  FLUBLOK QUADRIVALENT 0.5 ML injection, , Disp: , Rfl:  .  fluticasone (FLONASE) 50 MCG/ACT nasal spray, Place 1 spray into both nostrils daily., Disp: 48 g, Rfl: 3 .  insulin regular (NOVOLIN R) 100 units/mL injection, Inject 30 Units into the skin 3 (three) times daily before meals., Disp: , Rfl:  .  lisinopril (ZESTRIL) 10 MG tablet, Take 1 tablet (10 mg total) by mouth daily., Disp: 90 tablet, Rfl: 1 .  metFORMIN (GLUCOPHAGE) 1000 MG tablet, TAKE 1 TABLET BY MOUTH TWO TIMES DAILY WITH A MEAL, Disp: 180 tablet, Rfl: 1 .  NOVOFINE 32G X 6 MM MISC, , Disp: , Rfl:  .  nystatin (MYCOSTATIN/NYSTOP) powder, Apply topically 3 (three) times daily., Disp: 15 g, Rfl: 0 .  pantoprazole (PROTONIX) 20 MG tablet, TAKE 1 TABLET (20 MG TOTAL) BY MOUTH DAILY., Disp: 90 tablet, Rfl: 1 .  Prenatal Vit-Fe Fumarate-FA (PRENATAL VITAMIN PO), Take by mouth., Disp: , Rfl:  Social History   Socioeconomic History  . Marital status: Married    Spouse name: Yong Channel  . Number of children: Not on file  . Years of education: 58  . Highest education level: Master's degree (e.g., MA, MS, MEng, MEd, MSW, MBA)  Occupational History  . Occupation: third grade teacher  Tobacco Use  . Smoking status: Never Smoker  . Smokeless tobacco:  Never Used  Substance and Sexual Activity  . Alcohol use: Yes    Comment: occasionally  . Drug use: No  . Sexual activity: Yes  Other Topics Concern  . Not on file  Social History Narrative   Engaged to be married December 8 to Akins Determinants of [employer] Strain:   . Difficulty of Paying Living Expenses: Not on file  Food Insecurity:   . Worried About Charity fundraiser in the Last Year: Not on file  . Ran Out of Food in the Last Year: Not on file  Transportation Needs:   . Lack of Transportation (Medical): Not on file  . Lack of Transportation  (Non-Medical): Not on file  Physical Activity:   . Days of Exercise per Week: Not on file  . Minutes of Exercise per Session: Not on file  Stress:   . Feeling of Stress : Not on file  Social Connections:   . Frequency of Communication with Friends and Family: Not on file  . Frequency of Social Gatherings with Friends and Family: Not on file  . Attends Religious Services: Not on file  . Active Member of Clubs or Organizations: Not on file  . Attends Archivist Meetings: Not on file  . Marital Status: Not on file  Intimate Partner Violence:   . Fear of Current or Ex-Partner: Not on file  . Emotionally Abused: Not on file  . Physically Abused: Not on file  . Sexually Abused: Not on file   Family History  Problem Relation Age of Onset  . Birth defects Mother        one hand small  . Arthritis Father   . Diabetes Father   . Heart disease Father 9       heart attack  . Psoriasis Father   . Psoriasis Brother   . Stroke Paternal Grandmother        age 75  . Cancer Paternal Grandfather     Objective: Office vital signs reviewed. BP (!) 151/89   Pulse (!) 105   Temp 99.3 F (37.4 C) (Temporal)   Ht 5\' 6"  (1.676 m)   Wt 300 lb (136.1 kg)   SpO2 98%   BMI 48.42 kg/m   Physical Examination:  General: Awake, alert, morbidly obese, No acute distress HEENT: Normal; neck girth 21.5" Cardio: regular rate and rhythm, S1S2 heard, no murmurs appreciated Pulm: clear to auscultation bilaterally, no wheezes, rhonchi or rales; normal work of breathing on room air Extremities: warm, well perfused, trace nonpitting ankle edema, No cyanosis or clubbing; +2 pulses bilaterally  Assessment/ Plan: 39 y.o. female   1. Peripheral edema Likely due to venous stasis.  We discussed compression hose, elevation of lower extremities and avoidance of excess salt.  I have added hydrochlorothiazide 12.5 mg daily to take along with her lisinopril for both the edema and to help with blood  pressure.  She is to continue monitoring blood pressures.  If persistently elevated, plan to increase to 25 mg daily. - hydrochlorothiazide (HYDRODIURIL) 12.5 MG tablet; Take 1 tablet (12.5 mg total) by mouth daily.  Dispense: 90 tablet; Refill: 0  2. Uncontrolled hypertension - hydrochlorothiazide (HYDRODIURIL) 12.5 MG tablet; Take 1 tablet (12.5 mg total) by mouth daily.  Dispense: 90 tablet; Refill: 0   No orders of the defined types were placed in this encounter.  No orders of the defined types were placed in this encounter.    Amy Ball Jerilynn Mages  Lajuana Ripple, [place] [phone]

## 2019-05-21 DIAGNOSIS — S134XXA Sprain of ligaments of cervical spine, initial encounter: Secondary | ICD-10-CM | POA: Diagnosis not present

## 2019-05-21 DIAGNOSIS — S338XXA Sprain of other parts of lumbar spine and pelvis, initial encounter: Secondary | ICD-10-CM | POA: Diagnosis not present

## 2019-05-21 DIAGNOSIS — S233XXA Sprain of ligaments of thoracic spine, initial encounter: Secondary | ICD-10-CM | POA: Diagnosis not present

## 2019-06-01 DIAGNOSIS — S134XXA Sprain of ligaments of cervical spine, initial encounter: Secondary | ICD-10-CM | POA: Diagnosis not present

## 2019-06-01 DIAGNOSIS — S233XXA Sprain of ligaments of thoracic spine, initial encounter: Secondary | ICD-10-CM | POA: Diagnosis not present

## 2019-06-01 DIAGNOSIS — S338XXA Sprain of other parts of lumbar spine and pelvis, initial encounter: Secondary | ICD-10-CM | POA: Diagnosis not present

## 2019-06-05 ENCOUNTER — Other Ambulatory Visit: Payer: Self-pay

## 2019-06-05 ENCOUNTER — Encounter: Payer: Self-pay | Admitting: Family Medicine

## 2019-06-05 DIAGNOSIS — I152 Hypertension secondary to endocrine disorders: Secondary | ICD-10-CM

## 2019-06-05 DIAGNOSIS — E1159 Type 2 diabetes mellitus with other circulatory complications: Secondary | ICD-10-CM

## 2019-06-05 MED ORDER — LISINOPRIL 10 MG PO TABS: 10.0000 mg | ORAL_TABLET | Freq: Every day | ORAL | 0 refills | Status: DC

## 2019-06-05 MED FILL — LISINOPRIL 10 MG TABS: 10 | 90 days supply | Qty: 90 | Fill #0

## 2019-06-05 NOTE — Telephone Encounter (Signed)
Pt called back regarding same issue with question about taking Lisinopril and HCTZ. Confirmed with pt that per Dr Marjean Donna visit notes, pt should take the HCTZ and Lisinopril. Pt voiced understanding. Said she needs Dr Lajuana Ripple to send her a refill on Lisinopril Rx because she just ran out.   Also wanted to make Dr Lajuana Ripple aware that when she checked her BP in left arm today, it was 110/80 and when she checked it in right arm, it was 160/80. Pt said she would recheck and let us know if same issue keeps occurring.

## 2019-06-08 DIAGNOSIS — H3563 Retinal hemorrhage, bilateral: Secondary | ICD-10-CM | POA: Diagnosis not present

## 2019-06-08 DIAGNOSIS — E113513 Type 2 diabetes mellitus with proliferative diabetic retinopathy with macular edema, bilateral: Secondary | ICD-10-CM | POA: Diagnosis not present

## 2019-06-10 ENCOUNTER — Emergency Department (HOSPITAL_COMMUNITY)
Admission: EM | Admit: 2019-06-10 | Discharge: 2019-06-10 | Disposition: A | Discharge status: Home / Self Care | Payer: 59 | Attending: Emergency Medicine | Admitting: Emergency Medicine

## 2019-06-10 ENCOUNTER — Encounter (HOSPITAL_COMMUNITY): Payer: Self-pay | Admitting: *Deleted

## 2019-06-10 ENCOUNTER — Other Ambulatory Visit: Payer: Self-pay

## 2019-06-10 ENCOUNTER — Ambulatory Visit: Payer: 59 | Attending: Internal Medicine

## 2019-06-10 DIAGNOSIS — T50B95A Adverse effect of other viral vaccines, initial encounter: Secondary | ICD-10-CM | POA: Diagnosis not present

## 2019-06-10 DIAGNOSIS — E119 Type 2 diabetes mellitus without complications: Secondary | ICD-10-CM | POA: Diagnosis not present

## 2019-06-10 DIAGNOSIS — T783XXA Angioneurotic edema, initial encounter: Secondary | ICD-10-CM | POA: Insufficient documentation

## 2019-06-10 DIAGNOSIS — I959 Hypotension, unspecified: Secondary | ICD-10-CM | POA: Diagnosis not present

## 2019-06-10 DIAGNOSIS — I10 Essential (primary) hypertension: Secondary | ICD-10-CM | POA: Insufficient documentation

## 2019-06-10 DIAGNOSIS — Z79899 Other long term (current) drug therapy: Secondary | ICD-10-CM | POA: Diagnosis not present

## 2019-06-10 DIAGNOSIS — Z23 Encounter for immunization: Secondary | ICD-10-CM | POA: Insufficient documentation

## 2019-06-10 DIAGNOSIS — R000 Tachycardia, unspecified: Secondary | ICD-10-CM | POA: Diagnosis not present

## 2019-06-10 DIAGNOSIS — L509 Urticaria, unspecified: Secondary | ICD-10-CM | POA: Diagnosis not present

## 2019-06-10 DIAGNOSIS — T7840XA Allergy, unspecified, initial encounter: Secondary | ICD-10-CM | POA: Diagnosis not present

## 2019-06-10 DIAGNOSIS — T8069XA Other serum reaction due to other serum, initial encounter: Secondary | ICD-10-CM | POA: Diagnosis not present

## 2019-06-10 DIAGNOSIS — Z887 Allergy status to serum and vaccine status: Secondary | ICD-10-CM

## 2019-06-10 DIAGNOSIS — Z794 Long term (current) use of insulin: Secondary | ICD-10-CM | POA: Diagnosis not present

## 2019-06-10 LAB — BASIC METABOLIC PANEL
Anion gap: 8 (ref 5–15)
BUN: 17 mg/dL (ref 6–20)
CO2: 26 mmol/L (ref 22–32)
Calcium: 9.1 mg/dL (ref 8.9–10.3)
Chloride: 100 mmol/L (ref 98–111)
Creatinine, Ser: 0.6 mg/dL (ref 0.44–1.00)
GFR calc Af Amer: >60 mL/min (ref >60)
GFR calc non Af Amer: >60 mL/min (ref >60)
Glucose, Bld: 222 mg/dL — ABNORMAL HIGH (ref 70–99)
Potassium: 4.2 mmol/L (ref 3.5–5.1)
Sodium: 134 mmol/L — ABNORMAL LOW (ref 135–145)

## 2019-06-10 LAB — CBC WITH DIFFERENTIAL/PLATELET
Abs Immature Granulocytes: 0.02 10*3/uL (ref 0.00–0.07)
Basophils Absolute: 0.1 10*3/uL (ref 0.0–0.1)
Basophils Relative: 1 %
Eosinophils Absolute: 0.2 10*3/uL (ref 0.0–0.5)
Eosinophils Relative: 3 %
HCT: 41 % (ref 36.0–46.0)
Hemoglobin: 13.1 g/dL (ref 12.0–15.0)
Immature Granulocytes: 0 %
Lymphocytes Relative: 31 %
Lymphs Abs: 2.1 10*3/uL (ref 0.7–4.0)
MCH: 29 pg (ref 26.0–34.0)
MCHC: 32 g/dL (ref 30.0–36.0)
MCV: 90.9 fL (ref 80.0–100.0)
Monocytes Absolute: 0.4 10*3/uL (ref 0.1–1.0)
Monocytes Relative: 6 %
Neutro Abs: 4.1 10*3/uL (ref 1.7–7.7)
Neutrophils Relative %: 59 %
Platelets: 358 10*3/uL (ref 150–400)
RBC: 4.51 MIL/uL (ref 3.87–5.11)
RDW: 13 % (ref 11.5–15.5)
WBC: 6.9 10*3/uL (ref 4.0–10.5)
nRBC: 0 % (ref 0.0–0.2)

## 2019-06-10 LAB — POC URINE PREG, ED: Preg Test, Ur: NEGATIVE

## 2019-06-10 MED ORDER — SODIUM CHLORIDE 0.9 % IV BOLUS
1000.0000 mL | Freq: Once | INTRAVENOUS | Status: AC
  Administered 2019-06-10: 1000 mL via INTRAVENOUS

## 2019-06-10 MED ORDER — FAMOTIDINE IN NACL 20-0.9 MG/50ML-% IV SOLN
20.0000 mg | Freq: Once | INTRAVENOUS | Status: AC
  Administered 2019-06-10: 20 mg via INTRAVENOUS
  Filled 2019-06-10: qty 50

## 2019-06-10 MED ORDER — SODIUM CHLORIDE 0.9 % IV SOLN: INTRAVENOUS | Status: DC

## 2019-06-10 MED ORDER — EPINEPHRINE 0.3 MG/0.3ML IJ SOAJ: 0.3000 mg | INTRAMUSCULAR | 0 refills | Status: DC | PRN

## 2019-06-10 MED ORDER — PREDNISONE 10 MG (21) PO TBPK: ORAL_TABLET | ORAL | 0 refills | Status: DC

## 2019-06-10 MED ORDER — METHYLPREDNISOLONE SODIUM SUCC 125 MG IJ SOLR
125.0000 mg | Freq: Once | INTRAMUSCULAR | Status: AC
  Administered 2019-06-10: 125 mg via INTRAVENOUS
  Filled 2019-06-10: qty 2

## 2019-06-10 NOTE — ED Provider Notes (Signed)
[facility] EMERGENCY DEPARTMENT Provider Note   CSN: [id number] Arrival date & time: 06/10/19  1042     History Chief Complaint  Patient presents with  . Allergic Reaction    Amy Ball is a 39 y.o. female.  Pt presents to the ED today with an allergic reaction.  Pt received her first dose of the Pfizer vaccine today and developed some angioedema to her lips and tongue.  Pt given 50 mg benadryl IM by EMS PTA.  Pt is feeling better, but still feels like her lips are swollen.  No more sob.          Past Medical History:  Diagnosis Date  . Environmental allergies   . GAD (generalized anxiety disorder) 12/05/2018  . GERD (gastroesophageal reflux disease)   . Hypertension   . Type 2 diabetes mellitus ([place])    10 years    Patient Active Problem List   Diagnosis Date Noted  . GAD (generalized anxiety disorder) 12/05/2018  . Gastroesophageal reflux disease without esophagitis 02/20/2018  . Intertriginous candidiasis 02/20/2018  . Attempting to conceive 02/20/2018  . Obesity, Class III, BMI 40-49.9 (morbid obesity) ([place]) 02/15/2017  . Controlled type 2 diabetes mellitus without complication, without long-term current use of insulin ([place]) 02/15/2017  . Hypertension associated with diabetes ([place]) 02/15/2017  . Environmental allergies 02/15/2017  . Knee pain, right 10/01/2010  . Gait abnormality 10/01/2010    Past Surgical History:  Procedure Laterality Date  . FRACTURE SURGERY  2007   right ankle     OB History   No obstetric history on file.     Family History  Problem Relation Age of Onset  . Birth defects Mother        one hand small  . Arthritis Father   . Diabetes Father   . Heart disease Father 51       heart attack  . Psoriasis Father   . Psoriasis Brother   . Stroke Paternal Grandmother        age 22  . Cancer Paternal Grandfather     Social History   Tobacco Use  . Smoking status: Never Smoker  . Smokeless tobacco: Never Used  Substance  Use Topics  . Alcohol use: Yes    Comment: occasionally  . Drug use: No    Home Medications Prior to Admission medications   Medication Sig Start Date End Date Taking? Authorizing Provider  Ascorbic Acid (VITAMIN C) 1000 MG tablet Take 1,000 mg by mouth daily.     Yes [provider]  busPIRone (BUSPAR) 10 MG tablet TAKE 1 TABLET BY MOUTH THREE TIMES A DAY 05/14/19  Yes Gottschalk, Ashly M, DO  cetirizine (ZYRTEC) 10 MG tablet Take 10 mg by mouth daily.   Yes [provider]  Cyanocobalamin (VITAMIN B-12) 5000 MCG TBDP Take 1 tablet by mouth every other day.   Yes [provider]  fluticasone (FLONASE) 50 MCG/ACT nasal spray Place 1 spray into both nostrils daily. 07/05/18  Yes Gottschalk, Leatrice Jewels M, DO  insulin regular (NOVOLIN R) 100 units/mL injection Inject 30 Units into the skin 3 (three) times daily before meals.   Yes [provider]  lisinopril (ZESTRIL) 10 MG tablet Take 1 tablet (10 mg total) by mouth daily. 06/05/19  Yes Gottschalk, Leatrice Jewels M, DO  metFORMIN (GLUCOPHAGE) 1000 MG tablet TAKE 1 TABLET BY MOUTH TWO TIMES DAILY WITH A MEAL 10/23/18  Yes Janora Norlander, DO  NOVOFINE 32G X 6 MM MISC  06/29/10  Yes [provider]  nystatin (MYCOSTATIN/NYSTOP) powder Apply topically 3 (three) times daily. 01/02/19  Yes Gottschalk, Ashly M, DO  pantoprazole (PROTONIX) 20 MG tablet TAKE 1 TABLET (20 MG TOTAL) BY MOUTH DAILY. 04/19/19  Yes Ronnie Doss M, DO  Prenatal Vit-Fe Fumarate-FA (PRENATAL VITAMIN PO) Take by mouth.   Yes [provider]  EPINEPHrine 0.3 mg/0.3 mL IJ SOAJ injection Inject 0.3 mLs (0.3 mg total) into the muscle as needed for anaphylaxis. 06/10/19   Isla Pence, MD  FLUBLOK QUADRIVALENT 0.5 ML injection  02/05/19   [provider]  hydrochlorothiazide (HYDRODIURIL) 12.5 MG tablet Take 1 tablet (12.5 mg total) by mouth daily. Patient not taking: Reported on 06/10/2019 05/18/19   Janora Norlander, DO  predniSONE  (STERAPRED UNI-PAK 21 TAB) 10 MG (21) TBPK tablet Take 6 tabs for 2 days, then 5 for 2 days, then 4 for 2 days, then 3 for 2 days, 2 for 2 days, then 1 for 2 days 06/10/19   Isla Pence, MD    Allergies    Amoxicillin-pot clavulanate, Macrobid [nitrofurantoin macrocrystal], Basaglar kwikpen [insulin glargine], and Avelox [moxifloxacin hcl in nacl]  Review of Systems   Review of Systems  HENT: Positive for facial swelling.   Respiratory: Positive for shortness of breath.   All other systems reviewed and are negative.   Physical Exam Updated Vital Signs BP 119/84   Pulse 95   Temp 98.9 F (37.2 C) (Oral)   Resp 17   Ht 5\' 6"  (1.676 m)   Wt 122.5 kg   SpO2 99%   BMI 43.58 kg/m   Physical Exam Vitals and nursing note reviewed.  Constitutional:      Appearance: Normal appearance.  HENT:     Head: Normocephalic and atraumatic.     Right Ear: External ear normal.     Left Ear: External ear normal.     Nose: Nose normal.     Mouth/Throat:     Mouth: Mucous membranes are moist.     Pharynx: Oropharynx is clear.     Comments: Mild swelling of her lips and redness to the face Eyes:     Extraocular Movements: Extraocular movements intact.     Conjunctiva/sclera: Conjunctivae normal.     Pupils: Pupils are equal, round, and reactive to light.  Cardiovascular:     Rate and Rhythm: Normal rate and regular rhythm.     Pulses: Normal pulses.     Heart sounds: Normal heart sounds.  Pulmonary:     Effort: Pulmonary effort is normal.     Breath sounds: Normal breath sounds.  Abdominal:     General: Abdomen is flat. Bowel sounds are normal.     Palpations: Abdomen is soft.  Musculoskeletal:        General: Normal range of motion.     Cervical back: Normal range of motion and neck supple.  Skin:    General: Skin is warm.     Capillary Refill: Capillary refill takes less than 2 seconds.  Neurological:     General: No focal deficit present.     Mental Status: She is alert and  oriented to person, place, and time.  Psychiatric:        Mood and Affect: Mood normal.        Behavior: Behavior normal.     ED Results / Procedures / Treatments   Labs (all labs ordered are listed, but only abnormal results are displayed) Labs Reviewed  BASIC METABOLIC PANEL -  Abnormal; Notable for the following components:      Result Value   Sodium 134 (*)    Glucose, Bld 222 (*)    All other components within normal limits  CBC WITH DIFFERENTIAL/PLATELET  POC URINE PREG, ED    EKG None  Radiology No results found.  Procedures Procedures (including critical care time)  Medications Ordered in ED Medications  sodium chloride 0.9 % bolus 1,000 mL (has no administration in time range)    And  0.9 %  sodium chloride infusion (has no administration in time range)  methylPREDNISolone sodium succinate (SOLU-MEDROL) 125 mg/2 mL injection 125 mg (125 mg Intravenous Given 06/10/19 1108)  famotidine (PEPCID) IVPB 20 mg premix (0 mg Intravenous Stopped 06/10/19 1140)    ED Course  I have reviewed the triage vital signs and the nursing notes.  Pertinent labs & imaging results that were available during my care of the patient were reviewed by me and considered in my medical decision making (see chart for details).    MDM Rules/Calculators/A&P                     The CDC does not recommend a 2nd dose of the Tillman or the Moderna vaccine if there was an allergic rxn with the first.  Pt had Covid in October, so the first dose may be all she needs according to recent research.  Pt has been watched for several hrs.  She is feeling much better.  She is stable for d/c home.  Return if worse.  Final Clinical Impression(s) / ED Diagnoses Final diagnoses:  Allergy to vaccine    Rx / DC Orders ED Discharge Orders         Ordered    predniSONE (STERAPRED UNI-PAK 21 TAB) 10 MG (21) TBPK tablet     06/10/19 1321    EPINEPHrine 0.3 mg/0.3 mL IJ SOAJ injection  As needed     06/10/19  1321           Isla Pence, MD 06/10/19 1321

## 2019-06-10 NOTE — Progress Notes (Addendum)
Patient presented for her first dose of Pfizer covid-19 vaccination today. She had Covid in the fall.  She has had anaphylactic reaction to antibiotics in the past, was advised she should be evaluated for 30 minutes post vaccination today. She agreed, was seated in the front row of chairs, next to the EMS and Catering manager tables.  This RN checked in frequently with patient throughout her 30 minute observation period, conversing throughout the wait time. She reported feeling anxious about the vaccine and potential for side effects. She had her husband drive her to her appointment today. Her earlobes and forehead were flushed red and dry, but her neck was without redness. She had even, regular respirations and was able to speak clearly without issue.  RN reassured her that we were here, assessing for any changes and were available to help.  After 15 minutes, patient stated her mouth felt dry. RN offered water, patient accepted. She was able to drink and swallow without trouble. Patient's coloring remained the same, but she reported feeling better. She was still able to converse without difficulty.  At about 29 minutes, patient stated she was worried that something may happen after her 30 minutes was up, stated her tongue was starting to feeling thick. RN offered her additional observation with EMS, alerted them to the situation. Patient checked out with [facility] RN station and this RN walked her into the hallway for EMS evaluation.  EMS helped patient out of her sweatshirt and mask.  EMS noted lip swelling and increased redness in her face, neck, chest, earlobes.  EMS obtained vitals and administered 50 mg benadryl IM in her right deltoid.  Patient's husband came in from the parking lot. Patient declined solumedrol injection due to her diabetes.  EMS advised patient she needed additional evaluation and she was transported to [facility] via ambulance.  Patient's husband followed in his car.  VAERS report filed. Landis Gandy, RN

## 2019-06-10 NOTE — ED Triage Notes (Addendum)
Pt with allergic reaction pfzier covid vaccine- angioedema.  50mg  IM benadryl given by [facility]. Tongue swelling and dry mouth  Pt states this is her first shot

## 2019-06-10 NOTE — Progress Notes (Signed)
   Covid-19 Vaccination Clinic  Name:  Amy Ball    MRN: [id number] DOB: 31-Jul-1980  06/10/2019  Amy Ball was observed post Covid-19 immunization for 30 minutes based on pre-vaccination screening without incident. She was provided with Vaccine Information Sheet and instruction to access the V-Safe system.   Amy Ball was instructed to call 911 with any severe reactions post vaccine: Marland Kitchen Difficulty breathing  . Swelling of face and throat  . A fast heartbeat  . A bad rash all over body  . Dizziness and weakness   Immunizations Administered    Name Date Dose VIS Date Route   Pfizer COVID-19 Vaccine 06/10/2019  9:31 AM 0.3 mL 03/16/2019 Intramuscular   Manufacturer: [place]   Lot: [id number]   [place]: [id number]

## 2019-06-11 ENCOUNTER — Telehealth: Payer: Self-pay | Admitting: Family Medicine

## 2019-06-11 NOTE — Telephone Encounter (Signed)
Patient states that she feels fine today and will follow up with Dr. Darnell Level if needed.

## 2019-06-11 NOTE — Telephone Encounter (Signed)
I saw that.  Would NOT recommend getting the second dose FYI, given her severe reaction.

## 2019-06-11 NOTE — Telephone Encounter (Signed)
Patient aware and verbalizes understanding. 

## 2019-06-11 NOTE — Telephone Encounter (Signed)
Pt called and wanted to let Dr Darnell Level know that she got her first COVID Vaccine and said she did end up going to the ER from having an allergic reaction from it. Says she was given medication at the hospital and was recommended to take Benadryl at home if she felt like she needed to and see PCP for follow up if needed.

## 2019-06-13 MED FILL — FREESTYLE LANCETS: 90 days supply | Qty: 300 | Fill #0

## 2019-06-13 MED FILL — busPIRone HCL 10 MG TABS: 10 | 30 days supply | Qty: 90 | Fill #1

## 2019-06-18 ENCOUNTER — Other Ambulatory Visit: Payer: Self-pay | Admitting: Family Medicine

## 2019-06-18 DIAGNOSIS — E113512 Type 2 diabetes mellitus with proliferative diabetic retinopathy with macular edema, left eye: Secondary | ICD-10-CM | POA: Diagnosis not present

## 2019-06-18 MED FILL — metFORMIN HCL 1000 MG TABS: 1000 | 90 days supply | Qty: 180 | Fill #0

## 2019-06-21 ENCOUNTER — Encounter: Payer: Self-pay | Admitting: Family Medicine

## 2019-07-01 ENCOUNTER — Ambulatory Visit: Payer: 59

## 2019-07-09 DIAGNOSIS — E113513 Type 2 diabetes mellitus with proliferative diabetic retinopathy with macular edema, bilateral: Secondary | ICD-10-CM | POA: Diagnosis not present

## 2019-07-09 DIAGNOSIS — E113511 Type 2 diabetes mellitus with proliferative diabetic retinopathy with macular edema, right eye: Secondary | ICD-10-CM | POA: Diagnosis not present

## 2019-07-13 MED FILL — busPIRone HCL 10 MG TABS: 10 | 30 days supply | Qty: 90 | Fill #2

## 2019-07-23 MED FILL — FREESTYLE LITE TEST STRIP: 90 days supply | Qty: 300 | Fill #1

## 2019-07-23 MED FILL — HumuLIN R 100 UNIT/ML SOLN: 100 | 89 days supply | Qty: 80 | Fill #3

## 2019-07-23 MED FILL — BD INSULIN SYR 0.3 ML 6MMX3: 31G X 15/64 | 90 days supply | Qty: 300 | Fill #3

## 2019-07-23 MED FILL — PANTOPRAZOLE SOD DR 20 MG T: 20 | 90 days supply | Qty: 90 | Fill #1

## 2019-08-10 DIAGNOSIS — E113513 Type 2 diabetes mellitus with proliferative diabetic retinopathy with macular edema, bilateral: Secondary | ICD-10-CM | POA: Diagnosis not present

## 2019-08-10 DIAGNOSIS — E113511 Type 2 diabetes mellitus with proliferative diabetic retinopathy with macular edema, right eye: Secondary | ICD-10-CM | POA: Diagnosis not present

## 2019-08-17 ENCOUNTER — Other Ambulatory Visit: Payer: Self-pay | Admitting: Family Medicine

## 2019-08-21 ENCOUNTER — Encounter: Payer: Self-pay | Admitting: Family Medicine

## 2019-08-28 ENCOUNTER — Other Ambulatory Visit: Payer: Self-pay | Admitting: Family Medicine

## 2019-08-28 DIAGNOSIS — R609 Edema, unspecified: Secondary | ICD-10-CM

## 2019-08-28 DIAGNOSIS — E1159 Type 2 diabetes mellitus with other circulatory complications: Secondary | ICD-10-CM

## 2019-08-28 DIAGNOSIS — I152 Hypertension secondary to endocrine disorders: Secondary | ICD-10-CM

## 2019-08-28 DIAGNOSIS — I10 Essential (primary) hypertension: Secondary | ICD-10-CM

## 2019-08-29 MED FILL — LISINOPRIL 10 MG TABS: 10 | 90 days supply | Qty: 90 | Fill #0

## 2019-08-29 MED FILL — HYDROCHLOROTHIAZIDE 12.5 MG: 12.5 | 90 days supply | Qty: 90 | Fill #0

## 2019-08-31 DIAGNOSIS — E113513 Type 2 diabetes mellitus with proliferative diabetic retinopathy with macular edema, bilateral: Secondary | ICD-10-CM | POA: Diagnosis not present

## 2019-08-31 DIAGNOSIS — K760 Fatty (change of) liver, not elsewhere classified: Secondary | ICD-10-CM | POA: Diagnosis not present

## 2019-08-31 DIAGNOSIS — D3502 Benign neoplasm of left adrenal gland: Secondary | ICD-10-CM | POA: Diagnosis not present

## 2019-08-31 DIAGNOSIS — E1165 Type 2 diabetes mellitus with hyperglycemia: Secondary | ICD-10-CM | POA: Diagnosis not present

## 2019-08-31 MED FILL — SYNJARDY XR 5-1000 MG TB24: 5-1000 | 90 days supply | Qty: 180 | Fill #0

## 2019-09-07 DIAGNOSIS — E113513 Type 2 diabetes mellitus with proliferative diabetic retinopathy with macular edema, bilateral: Secondary | ICD-10-CM | POA: Diagnosis not present

## 2019-09-07 DIAGNOSIS — E113511 Type 2 diabetes mellitus with proliferative diabetic retinopathy with macular edema, right eye: Secondary | ICD-10-CM | POA: Diagnosis not present

## 2019-09-07 DIAGNOSIS — H3563 Retinal hemorrhage, bilateral: Secondary | ICD-10-CM | POA: Diagnosis not present

## 2019-09-09 ENCOUNTER — Ambulatory Visit (INDEPENDENT_AMBULATORY_CARE_PROVIDER_SITE_OTHER)
Admission: RE | Admit: 2019-09-09 | Discharge: 2019-09-09 | Disposition: A | Discharge status: Home / Self Care | Payer: 59 | Source: Ambulatory Visit

## 2019-09-09 DIAGNOSIS — B029 Zoster without complications: Secondary | ICD-10-CM | POA: Diagnosis not present

## 2019-09-09 MED ORDER — VALACYCLOVIR HCL 1 G PO TABS: 1000.0000 mg | ORAL_TABLET | Freq: Three times a day (TID) | ORAL | 0 refills | Status: DC

## 2019-09-09 NOTE — Discharge Instructions (Signed)
Take valtrex 3 times daily with food. Follow up with PCP this week for further evaluation of rash and sugars. Seek in-person evaluation in the meantime for discharge, worsening pain, fever.

## 2019-09-09 NOTE — ED Provider Notes (Signed)
Virtual Visit via Video Note:  Arbutus Ped  initiated request for Telemedicine visit with [facility] team. I connected with Arbutus Ped  on 09/09/2019 at 1:05 PM  for a synchronized telemedicine visit using a video enabled HIPPA compliant telemedicine application. I verified that I am speaking with Arbutus Ped  using two identifiers. [place] Hall-Potvin, PA-C  was physically located in a [facility] site and Lashonne Shull was located at a different location.   The limitations of evaluation and management by telemedicine as well as the availability of in-person appointments were discussed. Patient was informed that she  may incur a bill ( including co-pay) for this virtual visit encounter. Arbutus Ped  expressed understanding and gave verbal consent to proceed with virtual visit.     History of Present Illness:Amy Ball  is a 39 y.o. female history of type 2 diabetes, allergies, hypertension, obesity presents with concern for shingles.  Patient states that she developed significant left infrascapular pain that radiated over left breast and under her armpit a few days ago.  Noticed rash last night.  States the rash is linear, mildly vesicular, sore: Worse with movements, contact.  Also describes a tingling sensation.  No pruritus.  Patient also endorsing history of psoriasis and folliculitis: States this looks and feels different.  Patient states her sugars have been up lately: Followed by her PCP for this as of last week, new medication pending.  Patient has also been under significant stress as she is a Pharmacist, hospital in an elementary school and it is the end of the year and she was recently told that there "are going to be changes next year ".  Patient denies SI/HI.  No fever, arthralgias, myalgias, change in topical products.  Does endorse history of chickenpox in childhood.     ROS as per HPI  Past Medical History:  Diagnosis Date  . Environmental  allergies   . GAD (generalized anxiety disorder) 12/05/2018  . GERD (gastroesophageal reflux disease)   . Hypertension   . Type 2 diabetes mellitus (HCC)    10 years    Allergies  Allergen Reactions  . Amoxicillin-Pot Clavulanate Anaphylaxis  . Macrobid [Nitrofurantoin Macrocrystal] Swelling and Rash  . Basaglar Claiborne Rigg [Insulin Glargine] Swelling  . Avelox [Moxifloxacin Hcl In Nacl] Rash        Observations/Objective: 39 y.o. female Sitting in no acute distress.  Patient is able to speak in full sentences without coughing, sneezing, wheezing.  Patient does have linear papulovesicular, linear lesion under left axilla extending towards back.  No lesion over breast or mid back.  No lesions over right side.  No open wound, streaking, discharge.  Assessment and Plan: H&P concerning for shingles.  Will start Valtrex, patient follow-up with PCP for further evaluation/management if needed.  Return precautions discussed, patient verbalized understanding and is agreeable to plan.  Follow Up Instructions: Patient to seek in-person evaluation for persistent/worsening symptoms.   I discussed the assessment and treatment plan with the patient. The patient was provided an opportunity to ask questions and all were answered. The patient agreed with the plan and demonstrated an understanding of the instructions.   The patient was advised to call back or seek an in-person evaluation if the symptoms worsen or if the condition fails to improve as anticipated.  I provided 15 minutes of non-face-to-face time during this encounter.    [place] Hall-Potvin, PA-C  09/09/2019 1:05 PM        Hall-Potvin, [place], Hershal Coria 09/09/19  1305  

## 2019-09-10 ENCOUNTER — Other Ambulatory Visit: Payer: Self-pay | Admitting: Family Medicine

## 2019-09-10 ENCOUNTER — Encounter: Payer: Self-pay | Admitting: Family Medicine

## 2019-09-10 MED ORDER — METFORMIN HCL 1000 MG PO TABS: 1000.0000 mg | ORAL_TABLET | Freq: Two times a day (BID) | ORAL | 0 refills | Status: DC

## 2019-09-11 ENCOUNTER — Telehealth (INDEPENDENT_AMBULATORY_CARE_PROVIDER_SITE_OTHER): Payer: 59 | Admitting: Family Medicine

## 2019-09-11 ENCOUNTER — Encounter: Payer: Self-pay | Admitting: Family Medicine

## 2019-09-11 DIAGNOSIS — B029 Zoster without complications: Secondary | ICD-10-CM

## 2019-09-11 NOTE — Progress Notes (Signed)
Virtual Visit via Telephone Note  I connected with Amy Ball on 09/11/19 at 10:06 AM by telephone and verified that I am speaking with the correct person using two identifiers. Amy Ball is currently located at work and nobody is currently with her during this visit. The provider, Loman Brooklyn, FNP is located in their home at time of visit.  I discussed the limitations, risks, security and privacy concerns of performing an evaluation and management service by telephone and the availability of in person appointments. I also discussed with the patient that there may be a patient responsible charge related to this service. The patient expressed understanding and agreed to proceed.  Subjective: PCP: Amy Norlander, DO  Chief Complaint  Patient presents with  . Rash   Patient reports she had some itching under her left arm into her left shoulder blade on Friday.  On Saturday she started having a pain in the same area and that evening noticed a linear vesicular rash.  Her blood sugars were elevated up into the 300s that day.  She had a virtual visit on Sunday at which point she was diagnosed with shingles and prescribed Valtrex.  She reports the rash has started oozing.  She is at work, but has the area covered and is not touching it.  Blood sugars are back down to 120-200, which is where she was previously.   ROS: Per HPI  Current Outpatient Medications:  .  Ascorbic Acid (VITAMIN C) 1000 MG tablet, Take 1,000 mg by mouth daily.  , Disp: , Rfl:  .  busPIRone (BUSPAR) 10 MG tablet, TAKE 1 TABLET BY MOUTH THREE TIMES A DAY, Disp: 270 tablet, Rfl: 2 .  cetirizine (ZYRTEC) 10 MG tablet, Take 10 mg by mouth daily., Disp: , Rfl:  .  Cyanocobalamin (VITAMIN B-12) 5000 MCG TBDP, Take 1 tablet by mouth every other day., Disp: , Rfl:  .  EPINEPHrine 0.3 mg/0.3 mL IJ SOAJ injection, Inject 0.3 mLs (0.3 mg total) into the muscle as needed for anaphylaxis., Disp: 1 each, Rfl: 0 .   FLUBLOK QUADRIVALENT 0.5 ML injection, , Disp: , Rfl:  .  fluticasone (FLONASE) 50 MCG/ACT nasal spray, PLACE 1 SPRAY INTO BOTH NOSTRILS DAILY., Disp: 48 g, Rfl: 1 .  hydrochlorothiazide (HYDRODIURIL) 12.5 MG tablet, TAKE 1 TABLET BY MOUTH ONCE DAILY, Disp: 90 tablet, Rfl: 0 .  insulin regular (NOVOLIN R) 100 units/mL injection, Inject 30 Units into the skin 3 (three) times daily before meals., Disp: , Rfl:  .  lisinopril (ZESTRIL) 10 MG tablet, TAKE 1 TABLET BY MOUTH ONCE A DAY, Disp: 90 tablet, Rfl: 0 .  metFORMIN (GLUCOPHAGE) 1000 MG tablet, Take 1 tablet (1,000 mg total) by mouth 2 (two) times daily with a meal., Disp: 180 tablet, Rfl: 0 .  NOVOFINE 32G X 6 MM MISC, , Disp: , Rfl:  .  nystatin (MYCOSTATIN/NYSTOP) powder, Apply topically 3 (three) times daily., Disp: 15 g, Rfl: 0 .  pantoprazole (PROTONIX) 20 MG tablet, TAKE 1 TABLET (20 MG TOTAL) BY MOUTH DAILY., Disp: 90 tablet, Rfl: 1 .  predniSONE (STERAPRED UNI-PAK 21 TAB) 10 MG (21) TBPK tablet, Take 6 tabs for 2 days, then 5 for 2 days, then 4 for 2 days, then 3 for 2 days, 2 for 2 days, then 1 for 2 days, Disp: 42 tablet, Rfl: 0 .  Prenatal Vit-Fe Fumarate-FA (PRENATAL VITAMIN PO), Take by mouth., Disp: , Rfl:  .  valACYclovir (VALTREX) 1000 MG tablet, Take 1  tablet (1,000 mg total) by mouth 3 (three) times daily., Disp: 21 tablet, Rfl: 0  Allergies  Allergen Reactions  . Amoxicillin-Pot Clavulanate Anaphylaxis  . Macrobid [Nitrofurantoin Macrocrystal] Swelling and Rash  . Basaglar Claiborne Rigg [Insulin Glargine] Swelling  . Avelox [Moxifloxacin Hcl In Nacl] Rash   Past Medical History:  Diagnosis Date  . Environmental allergies   . GAD (generalized anxiety disorder) 12/05/2018  . GERD (gastroesophageal reflux disease)   . Hypertension   . Type 2 diabetes mellitus ([place])    10 years    Observations/Objective: A&O  No respiratory distress or wheezing audible over the phone Mood, judgement, and thought processes all  WNL   Assessment and Plan: 1. Herpes zoster without complication - Encouraged to complete course of Valtrex.  Advised to let us know if she experiences postherpetic neuralgia.   Follow Up Instructions:  I discussed the assessment and treatment plan with the patient. The patient was provided an opportunity to ask questions and all were answered. The patient agreed with the plan and demonstrated an understanding of the instructions.   The patient was advised to call back or seek an in-person evaluation if the symptoms worsen or if the condition fails to improve as anticipated.  The above assessment and management plan was discussed with the patient. The patient verbalized understanding of and has agreed to the management plan. Patient is aware to call the clinic if symptoms persist or worsen. Patient is aware when to return to the clinic for a follow-up visit. Patient educated on when it is appropriate to go to the emergency department.   Time call ended: 10:15 AM  I provided 11 minutes of non-face-to-face time during this encounter.  Hendricks Limes, MSN, APRN, FNP-C [facility] 09/11/19

## 2019-09-12 ENCOUNTER — Other Ambulatory Visit: Payer: Self-pay | Admitting: Family Medicine

## 2019-10-02 DIAGNOSIS — E113513 Type 2 diabetes mellitus with proliferative diabetic retinopathy with macular edema, bilateral: Secondary | ICD-10-CM | POA: Diagnosis not present

## 2019-10-02 MED FILL — DEXAMETHASONE 1 MG TABLET: 1 | 1 days supply | Qty: 1 | Fill #0

## 2019-10-03 MED FILL — DEXAMETHASONE 1 MG TABLET: 1 | 1 days supply | Qty: 1 | Fill #0

## 2019-10-09 DIAGNOSIS — E113513 Type 2 diabetes mellitus with proliferative diabetic retinopathy with macular edema, bilateral: Secondary | ICD-10-CM | POA: Diagnosis not present

## 2019-10-09 DIAGNOSIS — E113511 Type 2 diabetes mellitus with proliferative diabetic retinopathy with macular edema, right eye: Secondary | ICD-10-CM | POA: Diagnosis not present

## 2019-10-09 DIAGNOSIS — H35372 Puckering of macula, left eye: Secondary | ICD-10-CM | POA: Diagnosis not present

## 2019-10-11 MED FILL — FLUTICASONE PROP 50 MCG SPR: 50 | 60 days supply | Qty: 16 | Fill #1

## 2019-10-11 MED FILL — busPIRone HCL 10 MG TABS: 10 | 30 days supply | Qty: 90 | Fill #5

## 2019-10-12 DIAGNOSIS — Z6841 Body Mass Index (BMI) 40.0 and over, adult: Secondary | ICD-10-CM | POA: Diagnosis not present

## 2019-10-12 DIAGNOSIS — Z01419 Encounter for gynecological examination (general) (routine) without abnormal findings: Secondary | ICD-10-CM | POA: Diagnosis not present

## 2019-10-19 ENCOUNTER — Other Ambulatory Visit (HOSPITAL_COMMUNITY): Payer: Self-pay | Admitting: Internal Medicine

## 2019-10-19 MED FILL — FREESTYLE LANCETS: 90 days supply | Qty: 300 | Fill #1

## 2019-10-19 MED FILL — HumuLIN R 100 UNIT/ML SOLN: 100 | 89 days supply | Qty: 80 | Fill #4

## 2019-10-19 MED FILL — DROPLET INSULIN SYRINGE 31G: 31G X 15/64 | 90 days supply | Qty: 300 | Fill #0

## 2019-10-19 MED FILL — FREESTYLE LITE TEST STRIP: 90 days supply | Qty: 300 | Fill #2

## 2019-10-22 ENCOUNTER — Other Ambulatory Visit: Payer: Self-pay | Admitting: Family Medicine

## 2019-10-22 MED FILL — PANTOPRAZOLE SOD DR 20 MG T: 20 | 90 days supply | Qty: 90 | Fill #0

## 2019-10-31 ENCOUNTER — Telehealth: Payer: Self-pay | Admitting: Family Medicine

## 2019-10-31 NOTE — Telephone Encounter (Signed)
LM to find out who she is currently seeing- jhb

## 2019-10-31 NOTE — Telephone Encounter (Signed)
Pt states the endocrinologist that she is seeing she is having issues with getting an appt and it wanting suggestions of another endocrinologist she can go to.

## 2019-11-01 ENCOUNTER — Telehealth: Payer: Self-pay | Admitting: Family Medicine

## 2019-11-01 ENCOUNTER — Encounter: Payer: Self-pay | Admitting: Family Medicine

## 2019-11-01 DIAGNOSIS — E1165 Type 2 diabetes mellitus with hyperglycemia: Secondary | ICD-10-CM | POA: Diagnosis not present

## 2019-11-01 DIAGNOSIS — K760 Fatty (change of) liver, not elsewhere classified: Secondary | ICD-10-CM | POA: Diagnosis not present

## 2019-11-01 DIAGNOSIS — D3502 Benign neoplasm of left adrenal gland: Secondary | ICD-10-CM | POA: Diagnosis not present

## 2019-11-01 DIAGNOSIS — E113513 Type 2 diabetes mellitus with proliferative diabetic retinopathy with macular edema, bilateral: Secondary | ICD-10-CM | POA: Diagnosis not present

## 2019-11-01 NOTE — Telephone Encounter (Signed)
Pt see DR Buddy Duty at [facility]

## 2019-11-01 NOTE — Telephone Encounter (Signed)
See pt MyChart message regarding this. Pt wants to hold off on new referral at this time.

## 2019-11-01 NOTE — Telephone Encounter (Signed)
Dr Buddy Duty at [facility] is current Dr = wants referral to another endocrinologist.

## 2019-11-01 NOTE — Telephone Encounter (Signed)
See pt MyChart encounter-pt would like to hold off on referral till after she sees PCP 12/05/19.

## 2019-11-01 NOTE — Telephone Encounter (Signed)
°  REFERRAL REQUEST Telephone Note 11/01/2019  What type of referral do you need? Pt wants to put endocrinologist referral on hold untl she sees dr in Sept  Why do you need this referral? na  Have you been seen at our office for this problem? na (Advise that they may need an appointment with their PCP before a referral can be done)  Is there a particular doctor or location that you prefer? na  Patient notified that referrals can take up to a week or longer to process. If they haven't heard anything within a week they should call back and speak with the referral department.

## 2019-11-12 ENCOUNTER — Other Ambulatory Visit: Payer: Self-pay | Admitting: Family Medicine

## 2019-11-12 DIAGNOSIS — E113513 Type 2 diabetes mellitus with proliferative diabetic retinopathy with macular edema, bilateral: Secondary | ICD-10-CM | POA: Diagnosis not present

## 2019-11-12 MED FILL — busPIRone HCL 10 MG TABS: 10 | 30 days supply | Qty: 90 | Fill #0

## 2019-11-21 ENCOUNTER — Other Ambulatory Visit: Payer: Self-pay | Admitting: Family Medicine

## 2019-11-21 DIAGNOSIS — I152 Hypertension secondary to endocrine disorders: Secondary | ICD-10-CM

## 2019-11-21 DIAGNOSIS — E1159 Type 2 diabetes mellitus with other circulatory complications: Secondary | ICD-10-CM

## 2019-11-21 DIAGNOSIS — I10 Essential (primary) hypertension: Secondary | ICD-10-CM

## 2019-11-21 DIAGNOSIS — R609 Edema, unspecified: Secondary | ICD-10-CM

## 2019-11-27 MED FILL — LISINOPRIL 10 MG TABS: 10 | 90 days supply | Qty: 90 | Fill #0

## 2019-12-01 ENCOUNTER — Other Ambulatory Visit: Payer: Self-pay | Admitting: Family Medicine

## 2019-12-05 ENCOUNTER — Other Ambulatory Visit: Payer: Self-pay

## 2019-12-05 ENCOUNTER — Encounter: Payer: Self-pay | Admitting: Family Medicine

## 2019-12-05 ENCOUNTER — Ambulatory Visit: Payer: 59 | Admitting: Family Medicine

## 2019-12-05 DIAGNOSIS — E1165 Type 2 diabetes mellitus with hyperglycemia: Secondary | ICD-10-CM

## 2019-12-05 DIAGNOSIS — L409 Psoriasis, unspecified: Secondary | ICD-10-CM

## 2019-12-05 DIAGNOSIS — F50819 Binge eating disorder, unspecified: Secondary | ICD-10-CM

## 2019-12-05 DIAGNOSIS — H60332 Swimmer's ear, left ear: Secondary | ICD-10-CM

## 2019-12-05 DIAGNOSIS — F5081 Binge eating disorder: Secondary | ICD-10-CM | POA: Diagnosis not present

## 2019-12-05 MED ORDER — LISDEXAMFETAMINE DIMESYLATE 20 MG PO CAPS: 20.0000 mg | ORAL_CAPSULE | Freq: Every day | ORAL | 0 refills | Status: DC

## 2019-12-05 MED ORDER — NEOMYCIN-POLYMYXIN-HC 3.5-10000-1 OT SOLN: 3.0000 [drp] | Freq: Four times a day (QID) | OTIC | 0 refills | Status: AC
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
## 2019-12-06 ENCOUNTER — Encounter: Payer: Self-pay | Admitting: Family Medicine

## 2019-12-07 ENCOUNTER — Other Ambulatory Visit: Payer: Self-pay | Admitting: Family Medicine

## 2019-12-07 MED FILL — busPIRone HCL 10 MG TABS: 10 | 30 days supply | Qty: 90 | Fill #0

## 2019-12-11 DIAGNOSIS — E113513 Type 2 diabetes mellitus with proliferative diabetic retinopathy with macular edema, bilateral: Secondary | ICD-10-CM | POA: Diagnosis not present

## 2019-12-11 DIAGNOSIS — H3563 Retinal hemorrhage, bilateral: Secondary | ICD-10-CM | POA: Diagnosis not present

## 2019-12-11 DIAGNOSIS — H43823 Vitreomacular adhesion, bilateral: Secondary | ICD-10-CM | POA: Diagnosis not present

## 2019-12-11 DIAGNOSIS — H35373 Puckering of macula, bilateral: Secondary | ICD-10-CM | POA: Diagnosis not present

## 2019-12-14 ENCOUNTER — Other Ambulatory Visit: Payer: Self-pay | Admitting: Family Medicine

## 2019-12-14 MED ORDER — METFORMIN HCL 1000 MG PO TABS: 1000.0000 mg | ORAL_TABLET | Freq: Two times a day (BID) | ORAL | 0 refills | Status: DC

## 2019-12-14 MED FILL — METFORMIN HCL 1000 MG TABS: 1000 | 30 days supply | Qty: 60 | Fill #0

## 2019-12-19 MED FILL — FLUTICASONE PROP 50 MCG SPR: 50 | 60 days supply | Qty: 16 | Fill #2

## 2019-12-24 DIAGNOSIS — E113511 Type 2 diabetes mellitus with proliferative diabetic retinopathy with macular edema, right eye: Secondary | ICD-10-CM | POA: Diagnosis not present

## 2019-12-31 MED FILL — busPIRone HCL 10 MG TABS: 10 | 30 days supply | Qty: 90 | Fill #1

## 2020-01-04 ENCOUNTER — Ambulatory Visit: Payer: 59 | Admitting: Family Medicine

## 2020-01-07 ENCOUNTER — Other Ambulatory Visit: Payer: Self-pay | Admitting: Family Medicine

## 2020-01-07 MED FILL — METFORMIN HCL 1000 MG TABS: 1000 | 60 days supply | Qty: 60 | Fill #0

## 2020-01-08 ENCOUNTER — Other Ambulatory Visit (HOSPITAL_COMMUNITY): Payer: Self-pay | Admitting: Internal Medicine

## 2020-01-08 MED FILL — DROPLET INSULIN SYRINGE 31G: 31G X 15/64 | 90 days supply | Qty: 300 | Fill #1

## 2020-01-08 MED FILL — HYDROCHLOROTHIAZIDE 12.5 MG: 12.5 | 90 days supply | Qty: 90 | Fill #0

## 2020-01-08 MED FILL — HumuLIN R 100 UNIT/ML SOLN: 100 | 88 days supply | Qty: 90 | Fill #0

## 2020-01-11 MED FILL — FREESTYLE LITE TEST STRIP: 90 days supply | Qty: 300 | Fill #3

## 2020-01-11 MED FILL — FREESTYLE LANCETS: 90 days supply | Qty: 300 | Fill #2

## 2020-01-14 ENCOUNTER — Other Ambulatory Visit: Payer: Self-pay | Admitting: Family Medicine

## 2020-01-14 MED FILL — PANTOPRAZOLE SOD DR 20 MG T: 20 | 90 days supply | Qty: 90 | Fill #0

## 2020-01-17 ENCOUNTER — Other Ambulatory Visit: Payer: Self-pay | Admitting: Family Medicine

## 2020-01-21 DIAGNOSIS — E113513 Type 2 diabetes mellitus with proliferative diabetic retinopathy with macular edema, bilateral: Secondary | ICD-10-CM | POA: Diagnosis not present

## 2020-01-30 MED FILL — busPIRone HCL 10 MG TABS: 10 | 30 days supply | Qty: 90 | Fill #2

## 2020-02-04 DIAGNOSIS — E113511 Type 2 diabetes mellitus with proliferative diabetic retinopathy with macular edema, right eye: Secondary | ICD-10-CM | POA: Diagnosis not present

## 2020-02-08 ENCOUNTER — Ambulatory Visit: Payer: 59 | Admitting: Family Medicine

## 2020-02-11 ENCOUNTER — Other Ambulatory Visit: Payer: Self-pay | Admitting: Family Medicine

## 2020-02-12 ENCOUNTER — Other Ambulatory Visit: Payer: Self-pay | Admitting: Family Medicine

## 2020-02-12 MED FILL — METFORMIN HCL 1000 MG TABS: 1000 | 30 days supply | Qty: 60 | Fill #0

## 2020-02-13 ENCOUNTER — Ambulatory Visit: Payer: 59 | Admitting: Endocrinology

## 2020-02-19 ENCOUNTER — Other Ambulatory Visit: Payer: Self-pay | Admitting: Family Medicine

## 2020-02-19 DIAGNOSIS — I152 Hypertension secondary to endocrine disorders: Secondary | ICD-10-CM

## 2020-02-19 DIAGNOSIS — E1159 Type 2 diabetes mellitus with other circulatory complications: Secondary | ICD-10-CM

## 2020-02-19 MED FILL — LISINOPRIL 10 MG TABS: 10 | 90 days supply | Qty: 90 | Fill #0

## 2020-02-29 ENCOUNTER — Other Ambulatory Visit: Payer: Self-pay | Admitting: Family Medicine

## 2020-03-03 DIAGNOSIS — E113513 Type 2 diabetes mellitus with proliferative diabetic retinopathy with macular edema, bilateral: Secondary | ICD-10-CM | POA: Diagnosis not present

## 2020-03-03 MED FILL — busPIRone HCL 10 MG TABS: 10 | 30 days supply | Qty: 90 | Fill #0

## 2020-03-04 ENCOUNTER — Other Ambulatory Visit: Payer: Self-pay | Admitting: Family Medicine

## 2020-03-07 ENCOUNTER — Other Ambulatory Visit: Payer: Self-pay | Admitting: Family Medicine

## 2020-03-07 MED FILL — METFORMIN HCL 1000 MG TABS: 1000 | 30 days supply | Qty: 60 | Fill #0

## 2020-03-10 ENCOUNTER — Other Ambulatory Visit: Payer: Self-pay | Admitting: Family Medicine

## 2020-03-12 ENCOUNTER — Ambulatory Visit: Payer: 59 | Admitting: Family Medicine

## 2020-03-14 ENCOUNTER — Ambulatory Visit: Payer: 59 | Admitting: Endocrinology

## 2020-03-14 ENCOUNTER — Encounter: Payer: Self-pay | Admitting: Endocrinology

## 2020-03-14 ENCOUNTER — Other Ambulatory Visit: Payer: Self-pay | Admitting: Endocrinology

## 2020-03-14 ENCOUNTER — Other Ambulatory Visit: Payer: Self-pay

## 2020-03-14 ENCOUNTER — Other Ambulatory Visit: Payer: Self-pay | Admitting: Family Medicine

## 2020-03-14 VITALS — BP 122/64 | HR 68 | Ht 65.0 in | Wt 306.0 lb

## 2020-03-14 DIAGNOSIS — E270 Other adrenocortical overactivity: Secondary | ICD-10-CM | POA: Diagnosis not present

## 2020-03-14 DIAGNOSIS — E119 Type 2 diabetes mellitus without complications: Secondary | ICD-10-CM | POA: Diagnosis not present

## 2020-03-14 HISTORY — DX: Other adrenocortical overactivity: E27.0

## 2020-03-14 LAB — POCT GLYCOSYLATED HEMOGLOBIN (HGB A1C): Hemoglobin A1C: 8.7 % — AB (ref 4.0–5.6)

## 2020-03-14 MED ORDER — INSULIN REGULAR HUMAN 100 UNIT/ML IJ SOLN: INTRAMUSCULAR | 3 refills | Status: DC

## 2020-03-14 MED ORDER — HUMULIN N 100 UNIT/ML ~~LOC~~ SUSP: 10.0000 [IU] | Freq: Every day | SUBCUTANEOUS | 3 refills | Status: DC

## 2020-03-14 MED ORDER — DEXAMETHASONE 1 MG PO TABS: ORAL_TABLET | ORAL | 0 refills | Status: DC

## 2020-03-14 MED FILL — DEXAMETHASONE 1 MG TABLET: 1 | 1 days supply | Qty: 1 | Fill #0

## 2020-03-14 MED FILL — FLUTICASONE PROP 50 MCG SPR: 50 | 60 days supply | Qty: 16 | Fill #3

## 2020-03-14 NOTE — Patient Instructions (Addendum)
good diet and exercise significantly improve the control of your diabetes.  please let me know if you wish to be referred to a dietician.  high blood sugar is very risky to your health.  you should see an eye doctor and dentist every year.  It is very important to get all recommended vaccinations.  Controlling your blood pressure and cholesterol drastically reduces the damage diabetes does to your body.  Those who smoke should quit.  Please discuss these with your doctor.  check your blood sugar 4 times a day: before the 3 meals, and at bedtime.  also check if you have symptoms of your blood sugar being too high or too low.  please keep a record of the readings and bring it to your next appointment here (or you can bring the meter itself).  You can write it on any piece of paper.  please call us sooner if your blood sugar goes below 70, or if you have a lot of readings over 200.   Please increase the Regular insulin to 3 times a day (just before each meal) 35-25-35 units, and: I have sent a prescription to your pharmacy, to add NPH, 10 units at bedtime.  You should do a "dexamethasone suppression test."  For this, you would take dexamethasone 1 mg at 10 pm (I have sent a prescription to your pharmacy), then come in for a "cortisol" blood test the next morning before 9 am.  You do not need to be fasting for this test.   Please come back for a follow-up appointment in 6 weeks.

## 2020-03-14 NOTE — Progress Notes (Signed)
Subjective:    Patient ID: Amy Ball, female    DOB: 1980/11/30, 39 y.o.   MRN: [id number]  HPI pt is referred by Dr Lajuana Ripple, for diabetes.  Pt states DM was dx'ed in 6195; it is complicated by DR; she has been on insulin since 3/21; pt says her diet and exercise are good; she has never had GDM, pancreatitis, pancreatic surgery, severe hypoglycemia or DKA.  She takes Reg insulin 30 units 3 times a day (just before each meal), and metformin.  She reports chronic weight gain.  She had a miscarriage in 6/20.  She is considering another pregnancy, in 2022.  She says cbg's vary from 97-330.  It is in general lowest in the afternoon, and highest fasting (higher than at HS, despite no HS-snack).  She had generalized swelling and sob, attributed to [employer]. Pt says she was also found to have hypercortisolemia.   Past Medical History:  Diagnosis Date  . Environmental allergies   . GAD (generalized anxiety disorder) 12/05/2018  . GERD (gastroesophageal reflux disease)   . Hypertension   . Type 2 diabetes mellitus ([facility])    10 years    Past Surgical History:  Procedure Laterality Date  . FRACTURE SURGERY  2007   right ankle    Social History   Socioeconomic History  . Marital status: Married    Spouse name: Yong Channel  . Number of children: Not on file  . Years of education: 68  . Highest education level: Master's degree (e.g., MA, MS, MEng, MEd, MSW, MBA)  Occupational History  . Occupation: third grade teacher  Tobacco Use  . Smoking status: Never Smoker  . Smokeless tobacco: Never Used  Vaping Use  . Vaping Use: Never used  Substance and Sexual Activity  . Alcohol use: Yes    Comment: occasionally  . Drug use: No  . Sexual activity: Yes  Other Topics Concern  . Not on file  Social History Narrative   Engaged to be married December 8 to [place] Determinants of Health   Financial Resource Strain: Not on [employer] Insecurity: Not on file  Transportation Needs:  Not on file  Physical Activity: Not on file  Stress: Not on file  Social Connections: Not on file  Intimate Partner Violence: Not on file    Current Outpatient Medications on File Prior to Visit  Medication Sig Dispense Refill  . Ascorbic Acid (VITAMIN C) 1000 MG tablet Take 1,000 mg by mouth daily.    . busPIRone (BUSPAR) 10 MG tablet TAKE 1 TABLET BY MOUTH 3 TIMES DAILY 90 tablet 0  . cetirizine (ZYRTEC) 10 MG tablet Take 10 mg by mouth daily.    . Cyanocobalamin (VITAMIN B-12) 5000 MCG TBDP Take 1 tablet by mouth every other day.    Marland Kitchen EPINEPHrine 0.3 mg/0.3 mL IJ SOAJ injection Inject 0.3 mLs (0.3 mg total) into the muscle as needed for anaphylaxis. 1 each 0  . fluticasone (FLONASE) 50 MCG/ACT nasal spray PLACE 1 SPRAY INTO BOTH NOSTRILS DAILY. 48 g 1  . glucose blood (FREESTYLE LITE) test strip check blood sugar    . hydrochlorothiazide (HYDRODIURIL) 12.5 MG tablet TAKE 1 TABLET BY MOUTH ONCE DAILY 90 tablet 0  . lisinopril (ZESTRIL) 10 MG tablet TAKE 1 TABLET BY MOUTH ONCE A DAY 90 tablet 0  . NOVOFINE 32G X 6 MM MISC     . pantoprazole (PROTONIX) 20 MG tablet TAKE 1 TABLET BY MOUTH ONCE DAILY 90 tablet 0  .  Prenatal Vit-Fe Fumarate-FA (PRENATAL VITAMIN PO) Take by mouth.    Marland Kitchen FREESTYLE LITE test strip 1 each 3 (three) times daily.    . Lancets (FREESTYLE) lancets 1 each 3 (three) times daily.    Marland Kitchen lisdexamfetamine (VYVANSE) 20 MG capsule Take 1 capsule (20 mg total) by mouth daily. (Patient not taking: Reported on 03/14/2020) 30 capsule 0  . nystatin (MYCOSTATIN/NYSTOP) powder Apply topically 3 (three) times daily. (Patient not taking: Reported on 03/14/2020) 15 g 0   No current facility-administered medications on file prior to visit.    Allergies  Allergen Reactions  . Amoxicillin-Pot Clavulanate Anaphylaxis  . Covid-19 Mrna Vacc (Moderna) Anaphylaxis  . Macrobid [Nitrofurantoin Macrocrystal] Swelling and Rash  . Basaglar Claiborne Rigg [Insulin Glargine] Swelling  . Avelox  [Moxifloxacin Hcl In Nacl] Rash    Family History  Problem Relation Age of Onset  . Birth defects Mother        one hand small  . Arthritis Father   . Diabetes Father   . Heart disease Father 35       heart attack  . Psoriasis Father   . Psoriasis Brother   . Stroke Paternal Grandmother        age 58  . Cancer Paternal Grandfather     BP 122/64 (BP Location: Left Arm, Patient Position: Sitting, Cuff Size: Large)   Pulse 68   Ht 5\' 5"  (1.651 m)   Wt (!) 306 lb (138.8 kg)   SpO2 98%   BMI 50.92 kg/m    Review of Systems denies blurry vision, chest pain, sob, n/v, urinary frequency, and depression.      Objective:   Physical Exam VITAL SIGNS:  See vs page GENERAL: no distress Pulses: dorsalis pedis intact bilat.   MSK: no deformity of the feet CV: trace bilat leg edema.   Skin:  no ulcer on the feet.  normal color and temp on the feet.  Old healed surgical scar at the right med malleolus.  Neuro: sensation is intact to touch on the feet.   Lab Results  Component Value Date   CREATININE 0.60 06/10/2019   BUN 17 06/10/2019   NA 134 (L) 06/10/2019   K 4.2 06/10/2019   CL 100 06/10/2019   CO2 26 06/10/2019   Lab Results  Component Value Date   TSH 2.370 04/17/2019   T4TOTAL 12.5 (H) 03/09/2018   A1c=8.7%   I have reviewed outside records, and summarized: Pt was noted to have elevated A1c, and referred here.  She was rx'ed valacyclovir, for poss shingles      Assessment & Plan:  Insulin-requiring type 2 DM: uncontrolled.   Fertility: I advised pump rx, but she declines.   Hypercortisolemia, new to me.  uncertain etiology and prognosis  Patient Instructions  good diet and exercise significantly improve the control of your diabetes.  please let me know if you wish to be referred to a dietician.  high blood sugar is very risky to your health.  you should see an eye doctor and dentist every year.  It is very important to get all recommended vaccinations.   Controlling your blood pressure and cholesterol drastically reduces the damage diabetes does to your body.  Those who smoke should quit.  Please discuss these with your doctor.  check your blood sugar 4 times a day: before the 3 meals, and at bedtime.  also check if you have symptoms of your blood sugar being too high or too low.  please keep  a record of the readings and bring it to your next appointment here (or you can bring the meter itself).  You can write it on any piece of paper.  please call us sooner if your blood sugar goes below 70, or if you have a lot of readings over 200.   Please increase the Regular insulin to 3 times a day (just before each meal) 35-25-35 units, and: I have sent a prescription to your pharmacy, to add NPH, 10 units at bedtime.  You should do a "dexamethasone suppression test."  For this, you would take dexamethasone 1 mg at 10 pm (I have sent a prescription to your pharmacy), then come in for a "cortisol" blood test the next morning before 9 am.  You do not need to be fasting for this test.   Please come back for a follow-up appointment in 6 weeks.

## 2020-03-15 ENCOUNTER — Other Ambulatory Visit: Payer: Self-pay | Admitting: Endocrinology

## 2020-03-20 ENCOUNTER — Ambulatory Visit: Payer: 59 | Admitting: Family

## 2020-03-27 MED FILL — busPIRone HCL 10 MG TABS: 10 | 30 days supply | Qty: 90 | Fill #0

## 2020-03-31 ENCOUNTER — Other Ambulatory Visit: Payer: Self-pay | Admitting: Family Medicine

## 2020-03-31 ENCOUNTER — Encounter: Payer: Self-pay | Admitting: Family Medicine

## 2020-03-31 ENCOUNTER — Telehealth: Payer: 59 | Admitting: Nurse Practitioner

## 2020-03-31 DIAGNOSIS — Z20828 Contact with and (suspected) exposure to other viral communicable diseases: Secondary | ICD-10-CM | POA: Diagnosis not present

## 2020-03-31 MED ORDER — OSELTAMIVIR PHOSPHATE 75 MG PO CAPS: 75.0000 mg | ORAL_CAPSULE | Freq: Two times a day (BID) | ORAL | 0 refills | Status: DC

## 2020-03-31 NOTE — Progress Notes (Signed)
E visit for Flu like symptoms   We are sorry that you are not feeling well.  Here is how we plan to help! Based on what you have shared with me it looks like you may have flu-like symptoms that should be watched but do not seem to indicate anti-viral treatment.  Influenza or "the flu" is   an infection caused by a respiratory virus. The flu virus is highly contagious and persons who did not receive their yearly flu vaccination may "catch" the flu from close contact.  We have anti-viral medications to treat the viruses that cause this infection. They are not a "cure" and only shorten the course of the infection. These prescriptions are most effective when they are given within the first 2 days of "flu" symptoms. Antiviral medication are indicated if you have a high risk of complications from the flu. You should  also consider an antiviral medication if you are in close contact with someone who is at risk. These medications can help patients avoid complications from the flu  but have side effects that you should know. Possible side effects from Tamiflu or oseltamivir include nausea, vomiting, diarrhea, dizziness, headaches, eye redness, sleep problems or other respiratory symptoms. You should not take Tamiflu if you have an allergy to oseltamivir or any to the ingredients in Tamiflu.  Based upon your symptoms and potential risk factors I have prescribed Oseltamivir (Tamiflu).  It has been sent to your designated pharmacy.  You will take one 75 mg capsule orally twice a day for the next 5 days.  ANYONE WHO HAS FLU SYMPTOMS SHOULD: . Stay home. The flu is highly contagious and going out or to work exposes others! . Be sure to drink plenty of fluids. Water is fine as well as fruit juices, sodas and electrolyte beverages. You may want to stay away from caffeine or alcohol. If you are nauseated, try taking small sips of liquids. How do you know if you are getting enough fluid? Your urine should be a pale  yellow or almost colorless. . Get rest. . Taking a steamy shower or using a humidifier may help nasal congestion and ease sore throat pain. Using a saline nasal spray works much the same way. . Cough drops, hard candies and sore throat lozenges may ease your cough. . Line up a caregiver. Have someone check on you regularly.   GET HELP RIGHT AWAY IF: . You cannot keep down liquids or your medications. . You become short of breath . Your fell like you are going to pass out or loose consciousness. . Your symptoms persist after you have completed your treatment plan MAKE SURE YOU   Understand these instructions.  Will watch your condition.  Will get help right away if you are not doing well or get worse.  Your e-visit answers were reviewed by a board certified advanced clinical practitioner to complete your personal care plan.  Depending on the condition, your plan could have included both over the counter or prescription medications.  If there is a problem please reply  once you have received a response from your provider.  Your safety is important to [place].  If you have drug allergies check your prescription carefully.    You can use MyChart to ask questions about today's visit, request a non-urgent call back, or ask for a work or school excuse for 24 hours related to this e-Visit. If it has been greater than 24 hours you will need to follow up with  your provider, or enter a new e-Visit to address those concerns.  You will get an e-mail in the next two days asking about your experience.  I hope that your e-visit has been valuable and will speed your recovery. Thank you for using e-visits.  5-10 minutes spent reviewing and documenting in chart.

## 2020-04-01 ENCOUNTER — Other Ambulatory Visit: Payer: Self-pay | Admitting: Family Medicine

## 2020-04-01 DIAGNOSIS — R609 Edema, unspecified: Secondary | ICD-10-CM

## 2020-04-01 DIAGNOSIS — I10 Essential (primary) hypertension: Secondary | ICD-10-CM

## 2020-04-01 MED ORDER — METFORMIN HCL 1000 MG PO TABS: 1000.0000 mg | ORAL_TABLET | Freq: Two times a day (BID) | ORAL | 0 refills | Status: DC

## 2020-04-01 MED ORDER — BUSPIRONE HCL 10 MG PO TABS: 10.0000 mg | ORAL_TABLET | Freq: Three times a day (TID) | ORAL | 0 refills | Status: DC

## 2020-04-01 MED FILL — METFORMIN HCL 1000 MG TABS: 1000 | 30 days supply | Qty: 60 | Fill #0

## 2020-04-01 MED FILL — HYDROCHLOROTHIAZIDE 12.5 MG: 12.5 | 90 days supply | Qty: 90 | Fill #0

## 2020-04-02 ENCOUNTER — Telehealth: Payer: Self-pay

## 2020-04-02 NOTE — Telephone Encounter (Signed)
Symptoms started 03/29/20. Yes she would like infusion if possible

## 2020-04-02 NOTE — Telephone Encounter (Signed)
Please review and advise.

## 2020-04-02 NOTE — Telephone Encounter (Signed)
Message has been sent to [facility].  They will contact her if she is able to get this infusion.

## 2020-04-02 NOTE — Telephone Encounter (Signed)
Yes.  When did she become symptomatic?  I can try and arrange [facility] for her if it has been less than 7 days

## 2020-04-03 ENCOUNTER — Telehealth: Payer: Self-pay | Admitting: Unknown Physician Specialty

## 2020-04-03 ENCOUNTER — Other Ambulatory Visit (HOSPITAL_COMMUNITY): Payer: Self-pay | Admitting: Internal Medicine

## 2020-04-03 ENCOUNTER — Telehealth: Payer: Self-pay | Admitting: Internal Medicine

## 2020-04-03 MED FILL — HumuLIN R 100 UNIT/ML SOLN: 100 | 88 days supply | Qty: 90 | Fill #0

## 2020-04-03 NOTE — Telephone Encounter (Signed)
Called to discuss with Amy Ball about Covid symptoms and the use of  monoclonal antibody infusion for those with mild to moderate Covid symptoms and at a high risk of hospitalization.     Pt is qualified for this infusion due to co-morbid conditions and/or a member of an at-risk group, however declines infusion at this time.  Symptoms reviewed as well as criteria for ending isolation.  Symptoms reviewed that would warrant ED/Hospital evaluation. Preventative practices reviewed. Patient verbalized understanding. Partially vaccinated and feeling better.  Severe reaction to allergies and declining

## 2020-04-03 NOTE — Telephone Encounter (Signed)
Called to Discuss with patient about Covid symptoms and the use of the monoclonal antibody infusion for those with mild to moderate Covid symptoms and at a high risk of hospitalization.     Pt appears to qualify for this infusion due to co-morbid conditions and/or a member of an at-risk group in accordance with the FDA Emergency Use Authorization. Hx IDDM, htn, BMI 50.   Unable to reach pt. Left VM and MyChart Message.  Symptom onset: uncertain Vaccinated: uncertain Qualified for Infusion: uncertain  Cyndee Brightly, NP [facility]

## 2020-04-04 ENCOUNTER — Other Ambulatory Visit: Payer: Self-pay | Admitting: Family Medicine

## 2020-04-04 DIAGNOSIS — I10 Essential (primary) hypertension: Secondary | ICD-10-CM

## 2020-04-04 DIAGNOSIS — R609 Edema, unspecified: Secondary | ICD-10-CM

## 2020-04-07 ENCOUNTER — Other Ambulatory Visit (HOSPITAL_COMMUNITY): Payer: Self-pay | Admitting: Internal Medicine

## 2020-04-07 ENCOUNTER — Other Ambulatory Visit: Payer: Self-pay | Admitting: Endocrinology

## 2020-04-07 MED FILL — FREESTYLE LITE TEST STRIP: 90 days supply | Qty: 300 | Fill #0

## 2020-04-11 MED FILL — TRUEPLUS SYR 1ML 30GX5/16: 30G X 5/16" | 75 days supply | Qty: 300 | Fill #0

## 2020-04-14 DIAGNOSIS — E113513 Type 2 diabetes mellitus with proliferative diabetic retinopathy with macular edema, bilateral: Secondary | ICD-10-CM | POA: Diagnosis not present

## 2020-04-14 MED FILL — PANTOPRAZOLE SOD DR 20 MG T: 20 | 90 days supply | Qty: 90 | Fill #0

## 2020-04-15 ENCOUNTER — Other Ambulatory Visit (HOSPITAL_COMMUNITY): Payer: Self-pay | Admitting: Internal Medicine

## 2020-04-18 ENCOUNTER — Ambulatory Visit (INDEPENDENT_AMBULATORY_CARE_PROVIDER_SITE_OTHER): Payer: 59 | Admitting: Family Medicine

## 2020-04-18 ENCOUNTER — Encounter: Payer: Self-pay | Admitting: Family Medicine

## 2020-04-18 ENCOUNTER — Other Ambulatory Visit: Payer: Self-pay | Admitting: Family Medicine

## 2020-04-18 ENCOUNTER — Other Ambulatory Visit: Payer: Self-pay

## 2020-04-18 ENCOUNTER — Ambulatory Visit: Payer: 59 | Admitting: Family Medicine

## 2020-04-18 VITALS — BP 137/86 | HR 110 | Temp 98.2°F | Ht 65.0 in | Wt 305.0 lb

## 2020-04-18 DIAGNOSIS — Z0001 Encounter for general adult medical examination with abnormal findings: Secondary | ICD-10-CM | POA: Diagnosis not present

## 2020-04-18 DIAGNOSIS — L409 Psoriasis, unspecified: Secondary | ICD-10-CM

## 2020-04-18 DIAGNOSIS — Z0000 Encounter for general adult medical examination without abnormal findings: Secondary | ICD-10-CM

## 2020-04-18 MED ORDER — BETAMETHASONE VALERATE 0.1 % EX OINT: 1.0000 "application " | TOPICAL_OINTMENT | Freq: Two times a day (BID) | CUTANEOUS | 1 refills | Status: DC

## 2020-04-18 MED FILL — BETAMETHASONE VALER 0.1% OI: 0.1 | 30 days supply | Qty: 90 | Fill #0

## 2020-04-18 NOTE — Progress Notes (Signed)
Subjective: CC: Annual physical exam PCP: Janora Norlander, DO ONG:[id number] Amy Ball is a 40 y.o. female presenting to clinic today for:  1.  Psoriasis Patient has been having flares of psoriasis along her back and upper buttocks.  She has a follow-up with dermatology but is not able to get in until mid to late March.  Not currently using any corticosteroids but describes diffuse itching and would really like to start this  2. morbid obesity Patient has not yet started the Vyvanse.  She was sick with COVID in December and felt reluctant to start it.  She just started working out again with her husband.  She continues to see endocrinology regularly.  She still has the Vyvanse and plans on starting it soon   Comprehensive review of systems is negative except for as Per HPI  Allergies  Allergen Reactions  . Amoxicillin-Pot Clavulanate Anaphylaxis  . Covid-19 Mrna Vacc (Moderna) Anaphylaxis  . Macrobid [Nitrofurantoin Macrocrystal] Swelling and Rash  . Basaglar Cla[place] Rigg [Insulin Glargine] Swelling  . Avelox [Moxifloxacin Hcl In Nacl] Rash   Past Medical History:  Diagnosis Date  . Environmental allergies   . GAD (generalized anxiety disorder) 12/05/2018  . GERD (gastroesophageal reflux disease)   . Hypertension   . Type 2 diabetes mellitus (HCC)    10 years    Current Outpatient Medications:  .  Ascorbic Acid (VITAMIN C) 1000 MG tablet, Take 1,000 mg by mouth daily., Disp: , Rfl:  .  busPIRone (BUSPAR) 10 MG tablet, Take 1 tablet (10 mg total) by mouth 3 (three) times daily., Disp: 90 tablet, Rfl: 0 .  cetirizine (ZYRTEC) 10 MG tablet, Take 10 mg by mouth daily., Disp: , Rfl:  .  Cyanocobalamin (VITAMIN B-12) 5000 MCG TBDP, Take 1 tablet by mouth every other day., Disp: , Rfl:  .  EPINEPHrine 0.3 mg/0.3 mL IJ SOAJ injection, Inject 0.3 mLs (0.3 mg total) into the muscle as needed for anaphylaxis., Disp: 1 each, Rfl: 0 .  fluticasone (FLONASE) 50 MCG/ACT nasal spray, PLACE 1 SPRAY  INTO BOTH NOSTRILS DAILY., Disp: 48 g, Rfl: 1 .  FREESTYLE LITE test strip, 1 each 3 (three) times daily., Disp: , Rfl:  .  glucose blood (FREESTYLE LITE) test strip, check blood sugar, Disp: , Rfl:  .  HUMULIN N 100 UNIT/ML injection, Inject 0.1 mLs (10 Units total) into the skin at bedtime., Disp: 30 mL, Rfl: 3 .  hydrochlorothiazide (HYDRODIURIL) 12.5 MG tablet, TAKE 1 TABLET BY MOUTH ONCE DAILY, Disp: 90 tablet, Rfl: 0 .  insulin regular (HUMULIN R) 100 units/mL injection, 3 times a day (just before each meal) 35-25-35 units, and 50-units syringes 4/day, Disp: 100 mL, Rfl: 3 .  Lancets (FREESTYLE) lancets, 1 each 3 (three) times daily., Disp: , Rfl:  .  lisinopril (ZESTRIL) 10 MG tablet, TAKE 1 TABLET BY MOUTH ONCE A DAY, Disp: 90 tablet, Rfl: 0 .  metFORMIN (GLUCOPHAGE) 1000 MG tablet, Take 1 tablet (1,000 mg total) by mouth 2 (two) times daily with a meal., Disp: 60 tablet, Rfl: 0 .  NOVOFINE 32G X 6 MM MISC, , Disp: , Rfl:  .  pantoprazole (PROTONIX) 20 MG tablet, TAKE 1 TABLET BY MOUTH ONCE DAILY, Disp: 90 tablet, Rfl: 0 .  Prenatal Vit-Fe Fumarate-FA (PRENATAL VITAMIN PO), Take by mouth., Disp: , Rfl:  .  TRUEPLUS INSULIN SYRINGE 30G X 5/16" 1 ML MISC, USE TO INJECT INSULIN 4 TIMES DAILY, Disp: 300 each, Rfl: 3 .  dexamethasone (DECADRON) 1 MG  tablet, Take at 9-10 PM, the night before blood test (Patient not taking: Reported on 04/18/2020), Disp: 1 tablet, Rfl: 0 .  lisdexamfetamine (VYVANSE) 20 MG capsule, Take 1 capsule (20 mg total) by mouth daily. (Patient not taking: No sig reported), Disp: 30 capsule, Rfl: 0 Social History   Socioeconomic History  . Marital status: Married    Spouse name: Yong Channel  . Number of children: Not on file  . Years of education: 33  . Highest education level: Master's degree (e.g., MA, MS, MEng, MEd, MSW, MBA)  Occupational History  . Occupation: third grade teacher  Tobacco Use  . Smoking status: Never Smoker  . Smokeless tobacco: Never Used  Vaping  Use  . Vaping Use: Never used  Substance and Sexual Activity  . Alcohol use: Yes    Comment: occasionally  . Drug use: No  . Sexual activity: Yes  Other Topics Concern  . Not on file  Social History Narrative   Engaged to be married December 8 to Amy Ball of Health   Financial Resource Strain: Not on [employer] Insecurity: Not on file  Transportation Needs: Not on file  Physical Activity: Not on file  Stress: Not on file  Social Connections: Not on file  Intimate Partner Violence: Not on file   Family History  Problem Relation Age of Onset  . Birth defects Mother        one hand small  . Arthritis Father   . Diabetes Father   . Heart disease Father 8       heart attack  . Psoriasis Father   . Psoriasis Brother   . Stroke Paternal Grandmother        age 54  . Cancer Paternal Grandfather     Objective: Office vital signs reviewed. BP 137/86   Pulse (!) 110   Temp 98.2 F (36.8 C) (Temporal)   Ht 5\' 5"  (1.651 m)   Wt (!) 305 lb (138.3 kg)   SpO2 96%   BMI 50.75 kg/m   Physical Examination:  General: Awake, alert, well-appearing, No acute distress HEENT: Normal; sclera white.  No exophthalmos or goiter Cardio: regular rate and rhythm, S1S2 heard, no murmurs appreciated Pulm: clear to auscultation bilaterally, no wheezes, rhonchi or rales; normal work of breathing on room air GI: Exam limited by obesity, non-tender, bowel sounds present  Extremities: warm, well perfused, No edema, cyanosis or clubbing; +2 pulses bilaterally MSK: No psoriatic arthritic changes appreciated.  She is ambulating independently.  Normal muscle tone Skin: Silver scales consistent with psoriatic plaques noted along the mid back and apex of gluteal fold Psych: Mood stable, speech normal, affect appropriate  Assessment/ Plan: 40 y.o. female   Annual physical exam  Psoriasis - Plan: betamethasone valerate ointment (VALISONE) 0.1 %  Morbid obesity (HCC)  Treat  psoriasis with topical corticosteroid.  She has an appointment with dermatology in March.  She has not yet started the Vyvanse but will try and follow-up with me in about a month.  Starting new exercise regimen.  We will plan to continue Vyvanse that she has good response to it.  No orders of the defined types were placed in this encounter.  No orders of the defined types were placed in this encounter.    Janora Norlander, DO [place] [phone]

## 2020-04-21 MED FILL — busPIRone HCL 10 MG TABS: 10 | 30 days supply | Qty: 90 | Fill #0

## 2020-04-23 ENCOUNTER — Encounter: Payer: Self-pay | Admitting: Family Medicine

## 2020-04-25 MED FILL — METFORMIN HCL 1000 MG TABS: 1000 | 30 days supply | Qty: 60 | Fill #1

## 2020-05-02 ENCOUNTER — Ambulatory Visit: Payer: 59 | Admitting: Endocrinology

## 2020-05-13 ENCOUNTER — Other Ambulatory Visit: Payer: Self-pay | Admitting: Family Medicine

## 2020-05-13 DIAGNOSIS — I152 Hypertension secondary to endocrine disorders: Secondary | ICD-10-CM

## 2020-05-13 DIAGNOSIS — E1159 Type 2 diabetes mellitus with other circulatory complications: Secondary | ICD-10-CM

## 2020-05-13 MED FILL — LISINOPRIL 10 MG TABS: 10 | 90 days supply | Qty: 90 | Fill #0

## 2020-05-17 MED FILL — busPIRone HCL 10 MG TABS: 10 | 30 days supply | Qty: 90 | Fill #0

## 2020-05-26 ENCOUNTER — Ambulatory Visit: Payer: 59 | Admitting: Family Medicine

## 2020-05-26 DIAGNOSIS — H3563 Retinal hemorrhage, bilateral: Secondary | ICD-10-CM | POA: Diagnosis not present

## 2020-05-26 DIAGNOSIS — E113513 Type 2 diabetes mellitus with proliferative diabetic retinopathy with macular edema, bilateral: Secondary | ICD-10-CM | POA: Diagnosis not present

## 2020-05-26 DIAGNOSIS — H35373 Puckering of macula, bilateral: Secondary | ICD-10-CM | POA: Diagnosis not present

## 2020-05-26 DIAGNOSIS — H43823 Vitreomacular adhesion, bilateral: Secondary | ICD-10-CM | POA: Diagnosis not present

## 2020-06-10 ENCOUNTER — Other Ambulatory Visit: Payer: Self-pay | Admitting: Family Medicine

## 2020-06-10 ENCOUNTER — Other Ambulatory Visit (HOSPITAL_COMMUNITY): Payer: Self-pay | Admitting: Dermatology

## 2020-06-10 DIAGNOSIS — L400 Psoriasis vulgaris: Secondary | ICD-10-CM | POA: Diagnosis not present

## 2020-06-10 MED FILL — TRIAMCINOLONE 0.1% CREAM: 0.1 | 30 days supply | Qty: 454 | Fill #0

## 2020-06-10 MED FILL — BETAMETHASONE DP 0.05% LOT: 0.05 | 30 days supply | Qty: 120 | Fill #0

## 2020-06-10 MED FILL — HYDROCORTISONE 2.5% OINT: 2.5 | 30 days supply | Qty: 454 | Fill #0

## 2020-06-10 MED FILL — CALCIPOTRIENE 0.005 % OINT: 0.005 | 30 days supply | Qty: 60 | Fill #0

## 2020-06-13 ENCOUNTER — Other Ambulatory Visit: Payer: Self-pay | Admitting: Family Medicine

## 2020-06-13 MED FILL — busPIRone HCL 10 MG TABS: 10 | 30 days supply | Qty: 90 | Fill #0

## 2020-06-16 ENCOUNTER — Other Ambulatory Visit: Payer: Self-pay | Admitting: Family Medicine

## 2020-06-18 ENCOUNTER — Other Ambulatory Visit: Payer: Self-pay | Admitting: Family Medicine

## 2020-06-18 ENCOUNTER — Ambulatory Visit: Payer: 59 | Admitting: Family Medicine

## 2020-06-18 DIAGNOSIS — H52223 Regular astigmatism, bilateral: Secondary | ICD-10-CM | POA: Diagnosis not present

## 2020-06-18 DIAGNOSIS — E119 Type 2 diabetes mellitus without complications: Secondary | ICD-10-CM | POA: Diagnosis not present

## 2020-06-18 DIAGNOSIS — H5213 Myopia, bilateral: Secondary | ICD-10-CM | POA: Diagnosis not present

## 2020-06-18 DIAGNOSIS — E113513 Type 2 diabetes mellitus with proliferative diabetic retinopathy with macular edema, bilateral: Secondary | ICD-10-CM | POA: Diagnosis not present

## 2020-06-23 ENCOUNTER — Other Ambulatory Visit (HOSPITAL_BASED_OUTPATIENT_CLINIC_OR_DEPARTMENT_OTHER): Payer: Self-pay

## 2020-06-23 ENCOUNTER — Other Ambulatory Visit (HOSPITAL_COMMUNITY): Payer: Self-pay | Admitting: Internal Medicine

## 2020-06-23 MED FILL — HumuLIN R 100 UNIT/ML SOLN: 100 | 88 days supply | Qty: 90 | Fill #0

## 2020-06-23 MED FILL — TECHLITE INSULIN SYRINGE 31: 31G X 15/64 | 90 days supply | Qty: 300 | Fill #0

## 2020-06-24 ENCOUNTER — Encounter: Payer: Self-pay | Admitting: Family Medicine

## 2020-06-24 ENCOUNTER — Telehealth: Payer: 59 | Admitting: Family Medicine

## 2020-06-24 DIAGNOSIS — J069 Acute upper respiratory infection, unspecified: Secondary | ICD-10-CM

## 2020-06-24 NOTE — Progress Notes (Signed)
Telephone visit  Subjective: CC: sinusitis PCP: Janora Norlander, DO ZOX:[id number] Amy Ball is a 40 y.o. female. Patient provides verbal consent for consult held via phone.  Due to COVID-19 pandemic this visit was conducted virtually. This visit type was conducted due to national recommendations for restrictions regarding the COVID-19 Pandemic (e.g. social distancing, sheltering in place) in an effort to limit this patient's exposure and mitigate transmission in our community. All issues noted in this document were discussed and addressed.  A physical exam was not performed with this format.   Location of patient: work Location of provider: [facility] Others present for call: none  1. Sinusitis She reports sinus pressure, body aches, rhinorrhea.  She has been using . Home COVID test negative.  She has been using Coricidin at home.  Symptoms did seem to get a little bit better but over the weekend they abruptly got worse.  She admits that she had been all a bit more physically active over the weekend and wonders if perhaps this contributed to it.  She reports having felt fatigued on Sunday and in fact her symptoms were so bad she missed work yesterday.  She is back at work today.  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  She is using her Flonase and Zyrtec.  No measured fevers.  She has had some leg pain in bilateral legs.  No overt swelling, redness.  She has had some intermittent pitting edema.   ROS: Per HPI  Allergies  Allergen Reactions  . Amoxicillin-Pot Clavulanate Anaphylaxis  . Covid-19 Mrna Vacc (Moderna) Anaphylaxis  . Macrobid [Nitrofurantoin Macrocrystal] Swelling and Rash  . Basaglar [place] Rigg [Insulin Glargine] Swelling  . Avelox [Moxifloxacin Hcl In Nacl] Rash   Past Medical History:  Diagnosis Date  . Environmental allergies   . GAD (generalized anxiety disorder) 12/05/2018  . GERD (gastroesophageal reflux disease)   . Hypertension   . Type 2 diabetes mellitus  (HCC)    10 years    Current Outpatient Medications:  .  Ascorbic Acid (VITAMIN C) 1000 MG tablet, Take 1,000 mg by mouth daily., Disp: , Rfl:  .  betamethasone valerate ointment (VALISONE) 0.1 %, Apply 1 application topically 2 (two) times daily. To affected psoriatic areas, Disp: 90 g, Rfl: 1 .  busPIRone (BUSPAR) 10 MG tablet, TAKE 1 TABLET BY MOUTH 3 TIMES DAILY., Disp: 90 tablet, Rfl: 0 .  cetirizine (ZYRTEC) 10 MG tablet, Take 10 mg by mouth daily., Disp: , Rfl:  .  Cyanocobalamin (VITAMIN B-12) 5000 MCG TBDP, Take 1 tablet by mouth every other day., Disp: , Rfl:  .  EPINEPHrine 0.3 mg/0.3 mL IJ SOAJ injection, Inject 0.3 mLs (0.3 mg total) into the muscle as needed for anaphylaxis., Disp: 1 each, Rfl: 0 .  fluticasone (FLONASE) 50 MCG/ACT nasal spray, PLACE 1 SPRAY INTO BOTH NOSTRILS DAILY., Disp: 48 g, Rfl: 1 .  FREESTYLE LITE test strip, 1 each 3 (three) times daily., Disp: , Rfl:  .  glucose blood (FREESTYLE LITE) test strip, check blood sugar, Disp: , Rfl:  .  HUMULIN N 100 UNIT/ML injection, Inject 0.1 mLs (10 Units total) into the skin at bedtime., Disp: 30 mL, Rfl: 3 .  hydrochlorothiazide (HYDRODIURIL) 12.5 MG tablet, TAKE 1 TABLET BY MOUTH ONCE DAILY, Disp: 90 tablet, Rfl: 0 .  insulin regular (HUMULIN R) 100 units/mL injection, 3 times a day (just before each meal) 35-25-35 units, and 50-units syringes 4/day, Disp: 100 mL, Rfl: 3 .  Lancets (FREESTYLE) lancets,  1 each 3 (three) times daily., Disp: , Rfl:  .  lisdexamfetamine (VYVANSE) 20 MG capsule, Take 1 capsule (20 mg total) by mouth daily. (Patient not taking: No sig reported), Disp: 30 capsule, Rfl: 0 .  lisinopril (ZESTRIL) 10 MG tablet, TAKE 1 TABLET BY MOUTH ONCE A DAY, Disp: 90 tablet, Rfl: 0 .  metFORMIN (GLUCOPHAGE) 1000 MG tablet, Take 1 tablet (1,000 mg total) by mouth 2 (two) times daily with a meal., Disp: 60 tablet, Rfl: 0 .  NOVOFINE 32G X 6 MM MISC, , Disp: , Rfl:  .  pantoprazole (PROTONIX) 20 MG tablet, TAKE  1 TABLET BY MOUTH ONCE DAILY, Disp: 90 tablet, Rfl: 0 .  Prenatal Vit-Fe Fumarate-FA (PRENATAL VITAMIN PO), Take by mouth., Disp: , Rfl:  .  TRUEPLUS INSULIN SYRINGE 30G X 5/16" 1 ML MISC, USE TO INJECT INSULIN 4 TIMES DAILY, Disp: 300 each, Rfl: 3  Assessment/ Plan: 40 y.o. female   Viral URI  She sounds like she is having a viral URI.  The leg pain likely secondary to the virus.  Nothing sounds bacterial at this point but I reiterated signs and symptoms that would suggest bacterial infection reasons for reevaluation.  She voiced good understanding.  Scheduled Tylenol may be beneficial for the lower extremity pain.  If she finds that pitting edema becomes more diffuse for any reason she is to contact me for an appointment.  Start time: 12:31pm End time: 12:46pm  Total time spent on patient care (including video visit/ documentation): 15 minutes  [place], [place] [phone]

## 2020-06-24 NOTE — Patient Instructions (Signed)

## 2020-06-25 ENCOUNTER — Other Ambulatory Visit: Payer: Self-pay | Admitting: Family Medicine

## 2020-06-25 ENCOUNTER — Encounter: Payer: Self-pay | Admitting: Family Medicine

## 2020-06-25 MED ORDER — AZITHROMYCIN 250 MG PO TABS: ORAL_TABLET | ORAL | 0 refills | Status: DC

## 2020-06-26 MED FILL — FREESTYLE LITE TEST STRIP: 90 days supply | Qty: 300 | Fill #0

## 2020-06-30 ENCOUNTER — Other Ambulatory Visit (HOSPITAL_COMMUNITY): Payer: Self-pay | Admitting: Internal Medicine

## 2020-07-03 ENCOUNTER — Other Ambulatory Visit (HOSPITAL_COMMUNITY): Payer: Self-pay | Admitting: Internal Medicine

## 2020-07-03 ENCOUNTER — Ambulatory Visit: Payer: 59 | Admitting: Family Medicine

## 2020-07-03 ENCOUNTER — Other Ambulatory Visit: Payer: Self-pay | Admitting: Family Medicine

## 2020-07-03 DIAGNOSIS — Z79899 Other long term (current) drug therapy: Secondary | ICD-10-CM | POA: Diagnosis not present

## 2020-07-03 DIAGNOSIS — E113513 Type 2 diabetes mellitus with proliferative diabetic retinopathy with macular edema, bilateral: Secondary | ICD-10-CM | POA: Diagnosis not present

## 2020-07-03 DIAGNOSIS — D3502 Benign neoplasm of left adrenal gland: Secondary | ICD-10-CM | POA: Diagnosis not present

## 2020-07-03 DIAGNOSIS — E1165 Type 2 diabetes mellitus with hyperglycemia: Secondary | ICD-10-CM | POA: Diagnosis not present

## 2020-07-03 DIAGNOSIS — Z7984 Long term (current) use of oral hypoglycemic drugs: Secondary | ICD-10-CM | POA: Diagnosis not present

## 2020-07-03 DIAGNOSIS — K760 Fatty (change of) liver, not elsewhere classified: Secondary | ICD-10-CM | POA: Diagnosis not present

## 2020-07-03 MED FILL — HYDROCHLOROTHIAZIDE 12.5 MG: 12.5 | 90 days supply | Qty: 90 | Fill #0

## 2020-07-03 MED FILL — HumuLIN N 100 UNIT/ML SUSP: 100 | 90 days supply | Qty: 30 | Fill #0

## 2020-07-06 ENCOUNTER — Other Ambulatory Visit: Payer: Self-pay | Admitting: Family Medicine

## 2020-07-07 ENCOUNTER — Other Ambulatory Visit (HOSPITAL_COMMUNITY): Payer: Self-pay

## 2020-07-07 DIAGNOSIS — E113513 Type 2 diabetes mellitus with proliferative diabetic retinopathy with macular edema, bilateral: Secondary | ICD-10-CM | POA: Diagnosis not present

## 2020-07-07 DIAGNOSIS — H35373 Puckering of macula, bilateral: Secondary | ICD-10-CM | POA: Diagnosis not present

## 2020-07-07 DIAGNOSIS — H43823 Vitreomacular adhesion, bilateral: Secondary | ICD-10-CM | POA: Diagnosis not present

## 2020-07-07 DIAGNOSIS — H3563 Retinal hemorrhage, bilateral: Secondary | ICD-10-CM | POA: Diagnosis not present

## 2020-07-07 MED ORDER — METFORMIN HCL 1000 MG PO TABS
ORAL_TABLET | Freq: Two times a day (BID) | ORAL | 0 refills | Status: DC
  Filled 2020-07-07: qty 60, 30d supply, fill #0

## 2020-07-07 MED FILL — Pantoprazole Sodium EC Tab 20 MG (Base Equiv): ORAL | 90 days supply | Qty: 90 | Fill #0 | Status: AC

## 2020-07-07 MED FILL — Fluticasone Propionate Nasal Susp 50 MCG/ACT: NASAL | 60 days supply | Qty: 16 | Fill #0 | Status: AC

## 2020-07-08 ENCOUNTER — Other Ambulatory Visit (HOSPITAL_COMMUNITY): Payer: Self-pay

## 2020-07-10 ENCOUNTER — Other Ambulatory Visit (HOSPITAL_COMMUNITY): Payer: Self-pay

## 2020-07-21 DIAGNOSIS — E113512 Type 2 diabetes mellitus with proliferative diabetic retinopathy with macular edema, left eye: Secondary | ICD-10-CM | POA: Diagnosis not present

## 2020-07-22 ENCOUNTER — Other Ambulatory Visit (HOSPITAL_COMMUNITY): Payer: Self-pay

## 2020-07-22 MED ORDER — DEXCOM G6 SENSOR MISC
3 refills | Status: DC
Start: 2020-07-21 — End: 2022-08-06
  Filled 2020-07-22: qty 3, 30d supply, fill #0
  Filled 2021-06-17: qty 3, 30d supply, fill #1

## 2020-07-22 MED ORDER — DEXCOM G6 RECEIVER DEVI
0 refills | Status: DC
  Filled 2020-07-22: qty 1, 90d supply, fill #0

## 2020-07-22 MED ORDER — DEXCOM G6 TRANSMITTER MISC
3 refills | Status: DC
  Filled 2020-07-22: qty 1, 90d supply, fill #0
  Filled 2021-06-17: qty 1, 90d supply, fill #1

## 2020-07-31 ENCOUNTER — Ambulatory Visit: Payer: 59 | Admitting: Family Medicine

## 2020-08-02 ENCOUNTER — Other Ambulatory Visit (HOSPITAL_COMMUNITY): Payer: Self-pay

## 2020-08-02 MED FILL — Continuous Glucose System Sensor: 84 days supply | Qty: 6 | Fill #0 | Status: CN

## 2020-08-11 ENCOUNTER — Other Ambulatory Visit (HOSPITAL_COMMUNITY): Payer: Self-pay

## 2020-08-11 ENCOUNTER — Other Ambulatory Visit: Payer: Self-pay | Admitting: Family Medicine

## 2020-08-11 DIAGNOSIS — E1159 Type 2 diabetes mellitus with other circulatory complications: Secondary | ICD-10-CM

## 2020-08-11 DIAGNOSIS — I152 Hypertension secondary to endocrine disorders: Secondary | ICD-10-CM

## 2020-08-11 MED ORDER — LISINOPRIL 10 MG PO TABS
ORAL_TABLET | Freq: Every day | ORAL | 0 refills | Status: DC
  Filled 2020-08-11: qty 90, 90d supply, fill #0

## 2020-08-11 MED ORDER — METFORMIN HCL 1000 MG PO TABS
ORAL_TABLET | Freq: Two times a day (BID) | ORAL | 0 refills | Status: DC
  Filled 2020-08-11: qty 60, 30d supply, fill #0

## 2020-08-11 MED ORDER — "INSULIN SYRINGE-NEEDLE U-100 31G X 5/16"" 0.3 ML MISC"
3 refills | Status: DC
  Filled 2020-08-11 – 2020-08-19 (×2): qty 400, 90d supply, fill #0

## 2020-08-11 MED FILL — Fluticasone Propionate Nasal Susp 50 MCG/ACT: NASAL | 60 days supply | Qty: 16 | Fill #1 | Status: CN

## 2020-08-11 MED FILL — Buspirone HCl Tab 10 MG: ORAL | 30 days supply | Qty: 90 | Fill #0 | Status: AC

## 2020-08-12 ENCOUNTER — Other Ambulatory Visit (HOSPITAL_COMMUNITY): Payer: Self-pay

## 2020-08-19 ENCOUNTER — Other Ambulatory Visit: Payer: Self-pay | Admitting: Family Medicine

## 2020-08-19 ENCOUNTER — Encounter: Payer: Self-pay | Admitting: Family Medicine

## 2020-08-19 ENCOUNTER — Other Ambulatory Visit (HOSPITAL_COMMUNITY): Payer: Self-pay

## 2020-08-19 MED ORDER — FLUTICASONE PROPIONATE 50 MCG/ACT NA SUSP
1.0000 | Freq: Every day | NASAL | 1 refills | Status: DC
  Filled 2020-08-19: qty 48, 180d supply, fill #0
  Filled 2020-08-25: qty 16, 60d supply, fill #0
  Filled 2020-10-07 – 2020-10-30 (×2): qty 16, 60d supply, fill #1
  Filled 2020-12-02 – 2021-01-26 (×2): qty 16, 60d supply, fill #2
  Filled 2021-03-30: qty 16, 60d supply, fill #3
  Filled 2021-05-25: qty 16, 60d supply, fill #4

## 2020-08-23 ENCOUNTER — Other Ambulatory Visit (HOSPITAL_COMMUNITY): Payer: Self-pay

## 2020-08-25 ENCOUNTER — Other Ambulatory Visit (HOSPITAL_COMMUNITY): Payer: Self-pay

## 2020-09-02 ENCOUNTER — Emergency Department (HOSPITAL_COMMUNITY): Payer: 59

## 2020-09-02 ENCOUNTER — Ambulatory Visit
Admission: EM | Admit: 2020-09-02 | Discharge: 2020-09-02 | Disposition: A | Discharge status: Home / Self Care | Payer: 59

## 2020-09-02 ENCOUNTER — Emergency Department (HOSPITAL_COMMUNITY)
Admission: EM | Admit: 2020-09-02 | Discharge: 2020-09-02 | Disposition: A | Discharge status: Home / Self Care | Payer: 59 | Attending: Emergency Medicine | Admitting: Emergency Medicine

## 2020-09-02 ENCOUNTER — Encounter (HOSPITAL_COMMUNITY): Payer: Self-pay | Admitting: *Deleted

## 2020-09-02 ENCOUNTER — Other Ambulatory Visit: Payer: Self-pay

## 2020-09-02 DIAGNOSIS — E119 Type 2 diabetes mellitus without complications: Secondary | ICD-10-CM | POA: Diagnosis not present

## 2020-09-02 DIAGNOSIS — R0789 Other chest pain: Secondary | ICD-10-CM | POA: Diagnosis not present

## 2020-09-02 DIAGNOSIS — M542 Cervicalgia: Secondary | ICD-10-CM | POA: Diagnosis not present

## 2020-09-02 DIAGNOSIS — I10 Essential (primary) hypertension: Secondary | ICD-10-CM | POA: Insufficient documentation

## 2020-09-02 DIAGNOSIS — Z751 Person awaiting admission to adequate facility elsewhere: Secondary | ICD-10-CM

## 2020-09-02 LAB — CBC
HCT: 39.5 % (ref 36.0–46.0)
Hemoglobin: 13.2 g/dL (ref 12.0–15.0)
MCH: 30.3 pg (ref 26.0–34.0)
MCHC: 33.4 g/dL (ref 30.0–36.0)
MCV: 90.8 fL (ref 80.0–100.0)
Platelets: 368 10*3/uL (ref 150–400)
RBC: 4.35 MIL/uL (ref 3.87–5.11)
RDW: 12.8 % (ref 11.5–15.5)
WBC: 8.1 10*3/uL (ref 4.0–10.5)
nRBC: 0 % (ref 0.0–0.2)

## 2020-09-02 LAB — TROPONIN I (HIGH SENSITIVITY)
Troponin I (High Sensitivity): 3 ng/L (ref <18)
Troponin I (High Sensitivity): 3 ng/L (ref <18)

## 2020-09-02 LAB — BASIC METABOLIC PANEL
Anion gap: 9 (ref 5–15)
BUN: 15 mg/dL (ref 6–20)
CO2: 25 mmol/L (ref 22–32)
Calcium: 8.8 mg/dL — ABNORMAL LOW (ref 8.9–10.3)
Chloride: 97 mmol/L — ABNORMAL LOW (ref 98–111)
Creatinine, Ser: 0.75 mg/dL (ref 0.44–1.00)
GFR, Estimated: >60 mL/min (ref >60)
Glucose, Bld: 185 mg/dL — ABNORMAL HIGH (ref 70–99)
Potassium: 3.9 mmol/L (ref 3.5–5.1)
Sodium: 131 mmol/L — ABNORMAL LOW (ref 135–145)

## 2020-09-02 LAB — POC URINE PREG, ED: Preg Test, Ur: NEGATIVE

## 2020-09-02 MED ORDER — LIDOCAINE VISCOUS HCL 2 % MT SOLN
15.0000 mL | Freq: Once | OROMUCOSAL | Status: AC
  Administered 2020-09-02: 15 mL via ORAL
  Filled 2020-09-02: qty 15

## 2020-09-02 MED ORDER — ALUM & MAG HYDROXIDE-SIMETH 200-200-20 MG/5ML PO SUSP
30.0000 mL | Freq: Once | ORAL | Status: AC
  Administered 2020-09-02: 30 mL via ORAL
  Filled 2020-09-02: qty 30

## 2020-09-02 MED ORDER — SODIUM CHLORIDE 0.9 % IV BOLUS: 1000.0000 mL | Freq: Once | INTRAVENOUS | Status: DC

## 2020-09-02 NOTE — ED Triage Notes (Signed)
Chest pain to lt side of chest, lt arm and lt side of neck.  Had a similar episode on Saturday night.

## 2020-09-02 NOTE — Discharge Instructions (Signed)
Your lab work and imaging are all reassuring.  I suspect you are suffering from acid reflux.  You may double your dose of PPI for the next week, you may also take Pepcid as this can help with acid reflux.  Given you recommendations for food choices that will help decrease chances of flareups.  I want you to follow-up with your GI doctor for further evaluation.  Come back to the emergency department if you develop chest pain, shortness of breath, severe abdominal pain, uncontrolled nausea, vomiting, diarrhea.

## 2020-09-02 NOTE — ED Notes (Signed)
Patient is being discharged from the Urgent Care and sent to the Emergency Department via pov. Per Faustino Congress, NP , patient is in need of higher level of care due to CP . Patient is aware and verbalizes understanding of plan of care. There were no vitals filed for this visit.

## 2020-09-02 NOTE — ED Triage Notes (Signed)
Chest pain sent from urgent care

## 2020-09-02 NOTE — ED Provider Notes (Signed)
[facility] EMERGENCY DEPARTMENT Provider Note   CSN: [id number] Arrival date & time: 09/02/20  1624     History Chief Complaint  Patient presents with  . Chest Pain    Amy Ball is a 40 y.o. female.  HPI   Patient with significant medical history of GERD, anxiety, type 2 diabetes, hypertension presents to the emergency department with chief complaint of left-sided tightness.  Patient states she had this on Saturday, states it went away on its own, she then had it again this morning while she was at school.  Patient stated came on suddenly, she feels a tightness in her left chest which then radiates into her left arm, she denies paresthesia or weakness in her arm, she denies associated shortness of breath, becoming diaphoretic, nausea, vomiting, lightheaded or dizziness.  She has no cardiac history, no history of PEs or DVTs, currently not on oral birth control.  Patient states that she has a history of acid reflux and thinks it might be from this, she  Describes it as a burning sensation the middle of her chest.  She denies orthopnea or worsening pedal edema.  Patient denies headaches, fevers, chills, shortness breath, chest pain, abdominal pain, nausea, vomit, diarrhea, worsening pedal edema.  Past Medical History:  Diagnosis Date  . Environmental allergies   . GAD (generalized anxiety disorder) 12/05/2018  . GERD (gastroesophageal reflux disease)   . Hypertension   . Type 2 diabetes mellitus ([place])    10 years    Patient Active Problem List   Diagnosis Date Noted  . 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 GAD (generalized anxiety disorder) 12/05/2018  . Gastroesophageal reflux disease without esophagitis 02/20/2018  . Intertriginous candidiasis 02/20/2018  . Attempting to conceive 02/20/2018  . Obesity, Class III, BMI 40-49.9 (morbid obesity) ([place]) 02/15/2017  . Controlled type 2 diabetes mellitus without complication, without long-term current use of insulin ([place])  02/15/2017  . Hypertension associated with diabetes ([place]) 02/15/2017  . Environmental allergies 02/15/2017  . Knee pain, right 10/01/2010  . Gait abnormality 10/01/2010    Past Surgical History:  Procedure Laterality Date  . FRACTURE SURGERY  2007   right ankle     OB History   No obstetric history on file.     Family History  Problem Relation Age of Onset  . Birth defects Mother        one hand small  . Arthritis Father   . Diabetes Father   . Heart disease Father 1       heart attack  . Psoriasis Father   . Psoriasis Brother   . Stroke Paternal Grandmother        age 1  . Cancer Paternal Grandfather     Social History   Tobacco Use  . Smoking status: Never Smoker  . Smokeless tobacco: Never Used  Vaping Use  . Vaping Use: Never used  Substance Use Topics  . Alcohol use: Yes    Comment: occasionally  . Drug use: No    Home Medications   Allergies    Amoxicillin-pot clavulanate, Covid-19 mrna vacc (moderna), Macrobid [nitrofurantoin macrocrystal], Basaglar kwikpen [insulin glargine], and Avelox [moxifloxacin hcl in nacl]  Review of Systems   Review of Systems  Constitutional: Negative for chills and fever.  HENT: Negative for congestion and sore throat.   Respiratory: Positive for chest tightness. Negative for shortness of breath.   Cardiovascular: Negative for chest pain.  Gastrointestinal: Negative for abdominal pain, constipation, diarrhea and vomiting.  Genitourinary:  Negative for enuresis.  Musculoskeletal: Negative for back pain.  Skin: Negative for rash.  Neurological: Negative for dizziness and headaches.  Hematological: Does not bruise/bleed easily.    Physical Exam Updated Vital Signs BP (!) 158/77   Pulse 99   Temp 98.5 F (36.9 C) (Oral)   Resp 15   SpO2 98%   Physical Exam Vitals and nursing note reviewed.  Constitutional:      General: She is not in acute distress.    Appearance: She is not ill-appearing.  HENT:      Head: Normocephalic and atraumatic.     Nose: No congestion.  Eyes:     Conjunctiva/sclera: Conjunctivae normal.  Cardiovascular:     Rate and Rhythm: Normal rate and regular rhythm.     Pulses: Normal pulses.     Heart sounds: No murmur heard. No friction rub. No gallop.      Comments: Chest was palpated chest tightness is reproducible, it was noted on the left upper ribs along 2 and 3. Pulmonary:     Effort: No respiratory distress.     Breath sounds: No wheezing, rhonchi or rales.  Abdominal:     Palpations: Abdomen is soft.     Tenderness: There is no abdominal tenderness.  Musculoskeletal:     Right lower leg: No edema.     Left lower leg: No edema.  Skin:    General: Skin is warm and dry.  Neurological:     Mental Status: She is alert.  Psychiatric:        Mood and Affect: Mood normal.     ED Results / Procedures / Treatments   Labs (all labs ordered are listed, but only abnormal results are displayed) Labs Reviewed  BASIC METABOLIC PANEL - Abnormal; Notable for the following components:      Result Value   Sodium 131 (*)    Chloride 97 (*)    Glucose, Bld 185 (*)    Calcium 8.8 (*)    All other components within normal limits  CBC  POC URINE PREG, ED  TROPONIN I (HIGH SENSITIVITY)  TROPONIN I (HIGH SENSITIVITY)    EKG EKG Interpretation  Date/Time:  Tuesday Sep 02 2020 16:40:02 EDT Ventricular Rate:  104 PR Interval:  156 QRS Duration: 78 QT Interval:  354 QTC Calculation: 465 R Axis:   46 Text Interpretation: Sinus tachycardia Low voltage QRS Borderline ECG rate faster than prior 11/20 Confirmed by Aletta Edouard [phone]) on 09/02/2020 4:43:44 PM   Radiology DG Chest 2 View  Result Date: 09/02/2020 CLINICAL DATA:  Chest, left arm and left neck pain today. The patient has a similar episode the evening 08/30/2020. EXAM: CHEST - 2 VIEW COMPARISON:  PA and lateral chest 02/21/2019. FINDINGS: Lungs clear. Heart size normal. No pneumothorax or pleural  effusion. No acute or focal bony abnormality. IMPRESSION: Negative chest. Electronically Signed   By: Inge Rise M.D.   On: 09/02/2020 17:05    Procedures Procedures   Medications Ordered in ED Medications  sodium chloride 0.9 % bolus 1,000 mL (1,000 mLs Intravenous Not Given 09/02/20 1818)  alum & mag hydroxide-simeth (MAALOX/MYLANTA) 200-200-20 MG/5ML suspension 30 mL (30 mLs Oral Given 09/02/20 1816)    And  lidocaine (XYLOCAINE) 2 % viscous mouth solution 15 mL (15 mLs Oral Given 09/02/20 1816)    ED Course  I have reviewed the triage vital signs and the nursing notes.  Pertinent labs & imaging results that were available during my care of the  patient were reviewed by me and considered in my medical decision making (see chart for details).    MDM Rules/Calculators/A&P                         Initial impression-patient presents with left-sided chest tightness.  She is alert, does not appear in acute distress, vital signs notable for tachycardia.  Will obtain chest pain work-up provide patient GI cocktail and reassess.  Work-up-CBC unremarkable, BMP shows slight hyponatremia 131, elevated glucose of 185, first troponin is 3 second troponin is 3, EKG sinus tach without signs of ischemia  Reassessment -patient was reassessed after GI cocktail states pain is completely resolved, she has no complaints this time.  Noted that patient is continue to be tachycardic patient states she is very nervous and always has an elevated heart rate when she is here.  Suspect this is whitecoat syndrome.  We will continue to monitor.  Patient was reassessed, updated on lab work and imaging, vital signs have improved, patient is agreeable for discharge at this time.  Rule out- I have low suspicion for ACS as history is atypical, patient has no cardiac history, EKG was sinus rhythm without signs of ischemia, patient had a delta troponin.  Low suspicion for PE as patient denies pleuritic chest pain,  shortness of breath, patient denies leg pain, no pedal edema noted on exam.  Patient does have noted tachycardia but I suspect this secondary due to whitecoat syndrome, O2 sats stayed within the upper 90s, she is nontachypneic.  Low suspicion for AAA or aortic dissection as history is atypical, patient has low risk factors.  Low suspicion for systemic infection as patient is nontoxic-appearing, vital signs reassuring, no obvious source infection noted on exam.    Plan-  1.  Atypical chest pain since resolved-I suspect secondary due to acid reflux as she describes as a burning sensation, improved after GI cocktail.  We will have her double her PPI for next week, follow-up with GI for further evaluation.  Vital signs have remained stable, no indication for hospital admission.  Patient discussed with attending and they agreed with assessment and plan.  Patient given at home care as well strict return precautions.  Patient verbalized that they understood agreed to said plan.   Final Clinical Impression(s) / ED Diagnoses Final diagnoses:  Atypical chest pain    Rx / DC Orders ED Discharge Orders    None       Aron Baba 09/02/20 2042    Hayden Rasmussen, MD 09/03/20 1011

## 2020-09-09 DIAGNOSIS — K219 Gastro-esophageal reflux disease without esophagitis: Secondary | ICD-10-CM | POA: Diagnosis not present

## 2020-09-09 DIAGNOSIS — K760 Fatty (change of) liver, not elsewhere classified: Secondary | ICD-10-CM | POA: Diagnosis not present

## 2020-09-10 ENCOUNTER — Other Ambulatory Visit (HOSPITAL_COMMUNITY): Payer: Self-pay

## 2020-09-10 ENCOUNTER — Other Ambulatory Visit: Payer: Self-pay | Admitting: Family Medicine

## 2020-09-10 MED FILL — Insulin NPH (Human) (Isophane) Inj 100 Unit/ML: SUBCUTANEOUS | 90 days supply | Qty: 20 | Fill #0 | Status: AC

## 2020-09-10 MED FILL — Glucose Blood Test Strip: 90 days supply | Qty: 300 | Fill #0 | Status: AC

## 2020-09-11 ENCOUNTER — Other Ambulatory Visit (HOSPITAL_COMMUNITY): Payer: Self-pay

## 2020-09-11 MED ORDER — "TECHLITE INSULIN SYRINGE 31G X 15/64"" 0.3 ML MISC"
0 refills | Status: DC
  Filled 2020-09-11: qty 400, 90d supply, fill #0

## 2020-09-11 MED ORDER — INSULIN REGULAR HUMAN 100 UNIT/ML IJ SOLN
INTRAMUSCULAR | 3 refills | Status: DC
  Filled 2020-09-11: qty 90, 90d supply, fill #0
  Filled 2020-12-02: qty 90, 90d supply, fill #1
  Filled 2021-02-23: qty 90, 90d supply, fill #2

## 2020-09-11 MED ORDER — METFORMIN HCL 1000 MG PO TABS
1000.0000 mg | ORAL_TABLET | Freq: Two times a day (BID) | ORAL | 0 refills | Status: DC
  Filled 2020-09-11: qty 60, 30d supply, fill #0

## 2020-09-12 ENCOUNTER — Other Ambulatory Visit (HOSPITAL_COMMUNITY): Payer: Self-pay

## 2020-09-14 ENCOUNTER — Other Ambulatory Visit: Payer: Self-pay | Admitting: Family Medicine

## 2020-09-15 ENCOUNTER — Other Ambulatory Visit (HOSPITAL_COMMUNITY): Payer: Self-pay

## 2020-09-15 MED ORDER — BUSPIRONE HCL 10 MG PO TABS
ORAL_TABLET | Freq: Three times a day (TID) | ORAL | 0 refills | Status: DC
  Filled 2020-09-15: qty 90, 30d supply, fill #0

## 2020-09-16 ENCOUNTER — Encounter: Payer: Self-pay | Admitting: Family Medicine

## 2020-09-16 ENCOUNTER — Ambulatory Visit: Payer: 59 | Admitting: Family Medicine

## 2020-09-16 ENCOUNTER — Other Ambulatory Visit: Payer: Self-pay

## 2020-09-16 ENCOUNTER — Other Ambulatory Visit (HOSPITAL_COMMUNITY): Payer: Self-pay

## 2020-09-16 VITALS — BP 136/84 | HR 96 | Temp 97.9°F | Ht 65.0 in | Wt 321.4 lb

## 2020-09-16 DIAGNOSIS — F411 Generalized anxiety disorder: Secondary | ICD-10-CM | POA: Diagnosis not present

## 2020-09-16 DIAGNOSIS — R7989 Other specified abnormal findings of blood chemistry: Secondary | ICD-10-CM

## 2020-09-16 DIAGNOSIS — R0789 Other chest pain: Secondary | ICD-10-CM

## 2020-09-16 DIAGNOSIS — N926 Irregular menstruation, unspecified: Secondary | ICD-10-CM

## 2020-09-16 DIAGNOSIS — Z794 Long term (current) use of insulin: Secondary | ICD-10-CM | POA: Diagnosis not present

## 2020-09-16 DIAGNOSIS — E113513 Type 2 diabetes mellitus with proliferative diabetic retinopathy with macular edema, bilateral: Secondary | ICD-10-CM

## 2020-09-16 MED ORDER — PANTOPRAZOLE SODIUM 20 MG PO TBEC
20.0000 mg | DELAYED_RELEASE_TABLET | Freq: Two times a day (BID) | ORAL | 2 refills | Status: DC
  Filled 2020-09-16: qty 180, 90d supply, fill #0
  Filled 2021-02-19: qty 180, 90d supply, fill #1
  Filled 2021-07-12: qty 180, 90d supply, fill #2

## 2020-09-16 NOTE — Progress Notes (Signed)
Subjective: CC: Follow-up anxiety, diabetes PCP: Janora Norlander, DO AJO:[facility] Amy Ball is a 40 y.o. female presenting to clinic today for:  1.  Anxiety Patient reports good control of anxiety.  She did have an episode of chest pain that radiated to her left upper extremity and caused some numbness recently.  She had an evaluation emergency department which had ACS ruled out.  She subsequently saw her gastroenterologist who thought it may be related to acid reflux.  They recommended PPI twice daily.  She had in fact eaten steak prior to onset of symptoms but notes that she had otherwise felt normal prior to onset of events.  She has had no recurrence.  She has missed her menstrual cycle.  Urine pregnancy in the emergency department was negative.  2.  Abnormal cortisol test Patient reports she continues to gain weight.  Much of this she attributes to use of insulin but notes that she did have an abnormal cortisol level at 1 point.  She was post to do a repeat evaluation and take a pill prior to obtaining labs but she is not able to get down to [place] quick enough to have this done.  She is asking if we can have this done here.   ROS: Per HPI  Allergies  Allergen Reactions   Amoxicillin-Pot Clavulanate Anaphylaxis   Covid-19 Mrna Vacc (Moderna) Anaphylaxis   Macrobid [Nitrofurantoin Macrocrystal] Swelling and Rash   Basaglar Kwikpen [Insulin Glargine] Swelling   Avelox [Moxifloxacin Hcl In Nacl] Rash   Past Medical History:  Diagnosis Date   Environmental allergies    GAD (generalized anxiety disorder) 12/05/2018   GERD (gastroesophageal reflux disease)    Hypertension    Type 2 diabetes mellitus (HCC)    10 years    Current Outpatient Medications:    azithromycin (ZITHROMAX Z-PAK) 250 MG tablet, As directed (Patient not taking: No sig reported), Disp: 6 tablet, Rfl: 0   busPIRone (BUSPAR) 10 MG tablet, TAKE 1 TABLET BY MOUTH 3 TIMES DAILY, Disp: 90 tablet, Rfl: 0    cetirizine (ZYRTEC) 10 MG tablet, Take 10 mg by mouth daily., Disp: , Rfl:    Continuous Blood Gluc Receiver (DEXCOM G6 RECEIVER) DEVI, Use as directed, Disp: 1 each, Rfl: 0   Continuous Blood Gluc Sensor (DEXCOM G6 SENSOR) MISC, Change every 10 days, Disp: 9 each, Rfl: 3   Continuous Blood Gluc Sensor (FREESTYLE LIBRE 2 SENSOR) MISC, USE AS DIRECTED CHANGE EVERY 14 DAYS, Disp: 6 each, Rfl: 0   Continuous Blood Gluc Transmit (DEXCOM G6 TRANSMITTER) MISC, Use as directed every 90 days, Disp: 1 each, Rfl: 3   EPINEPHrine 0.3 mg/0.3 mL IJ SOAJ injection, Inject 0.3 mLs (0.3 mg total) into the muscle as needed for anaphylaxis., Disp: 1 each, Rfl: 0   fluticasone (FLONASE) 50 MCG/ACT nasal spray, PLACE 1 SPRAY INTO BOTH NOSTRILS DAILY., Disp: 48 g, Rfl: 1   FREESTYLE LITE test strip, 1 each 3 (three) times daily., Disp: , Rfl:    glucose blood (FREESTYLE LITE) test strip, check blood sugar, Disp: , Rfl:    glucose blood test strip, USE TO CHECK BLOOD SUGAR 3 TIMES DAILY (Patient taking differently: USE TO CHECK BLOOD SUGAR 3 TIMES DAILY), Disp: 300 strip, Rfl: 2   glucose blood test strip, USE TO CHECK BLOOD SUGAR 3 TIMES DAILY NEED OFFICE VISIT (Patient taking differently: USE TO CHECK BLOOD SUGAR 3 TIMES DAILY NEED OFFICE VISIT), Disp: 300 strip, Rfl: 0   glucose blood test strip,  USE TO CHECK BLOOD SUGAR 3 TIMES DAILY (Patient taking differently: USE TO CHECK BLOOD SUGAR 3 TIMES DAILY), Disp: 300 strip, Rfl: 0   HUMULIN N 100 UNIT/ML injection, Inject 0.1 mLs (10 Units total) into the skin at bedtime. (Patient taking differently: Inject 20 Units into the skin at bedtime.), Disp: 30 mL, Rfl: 3   hydrochlorothiazide (HYDRODIURIL) 12.5 MG tablet, TAKE 1 TABLET BY MOUTH ONCE DAILY, Disp: 90 tablet, Rfl: 0   hydrocortisone 2.5 % ointment, APPLY TO THE AFFECTED AREA(S) OF SKIN 2 TIMES DAILY, Disp: 454 g, Rfl: 3   insulin NPH Human (NOVOLIN N) 100 UNIT/ML injection, INJECT 20 UNITS UNDER THE SKIN AT BEDTIME,  Disp: 30 mL, Rfl: 4   insulin regular (HUMULIN R) 100 units/mL injection, 3 times a day (just before each meal) 35-25-35 units, and 50-units syringes 4/day (Patient taking differently: Inject 30 Units into the skin 3 (three) times daily before meals.), Disp: 100 mL, Rfl: 3   insulin regular (HUMULIN R) 100 units/mL injection, Inject 30 units under the skin at meals three times a day, Disp: 90 mL, Rfl: 3   Insulin Syringe-Needle U-100 (TECHLITE INSULIN SYRINGE) 31G X 15/64" 0.3 ML MISC, USE 1 SYRINGE 4 TIMES DAILY TO INJECT INSULIN., Disp: 400 each, Rfl: 0   Insulin Syringe-Needle U-100 31G X 15/64" 0.3 ML MISC, USE 3 TIMES A DAY SUBCUTANEOUSLY, Disp: 300 each, Rfl: 0   Insulin Syringe-Needle U-100 31G X 15/64" 0.3 ML MISC, USE THREE TIMES A DAY, Disp: 300 each, Rfl: 3   Insulin Syringe-Needle U-100 31G X 5/16" 0.3 ML MISC, USE TO ADMINISTER INSULIN 4 TIMES DAILY AS DIRECTED, Disp: 400 each, Rfl: 3   Lancets (FREESTYLE) lancets, 1 each 3 (three) times daily., Disp: , Rfl:    lisinopril (ZESTRIL) 10 MG tablet, TAKE 1 TABLET BY MOUTH ONCE A DAY, Disp: 90 tablet, Rfl: 0   metFORMIN (GLUCOPHAGE) 1000 MG tablet, Take 1 tablet (1,000 mg total) by mouth 2 (two) times daily with a meal. (NEEDS TO BE SEEN BEFORE NEXT REFILL), Disp: 60 tablet, Rfl: 0   NOVOFINE 32G X 6 MM MISC, , Disp: , Rfl:    pantoprazole (PROTONIX) 20 MG tablet, TAKE 1 TABLET BY MOUTH ONCE DAILY, Disp: 90 tablet, Rfl: 2   Prenatal Vit-Fe Fumarate-FA (PRENATAL VITAMIN PO), Take 1 tablet by mouth daily., Disp: , Rfl:    TRUEPLUS INSULIN SYRINGE 30G X 5/16" 1 ML MISC, USE TO INJECT INSULIN 4 TIMES DAILY, Disp: 300 each, Rfl: 3   Ascorbic Acid (VITAMIN C) 1000 MG tablet, Take 1,000 mg by mouth daily. (Patient not taking: Reported on 09/16/2020), Disp: , Rfl:    betamethasone dipropionate 0.05 % lotion, APPLY TO THE SCALP ONCE A DAY TAPER USE AS ABLE (Patient not taking: Reported on 09/16/2020), Disp: 120 mL, Rfl: 3   betamethasone valerate  ointment (VALISONE) 0.1 %, APPLY 2 TIMES DAILY TO AFFECTED PSORIATIC AREAS (Patient not taking: Reported on 09/16/2020), Disp: 90 g, Rfl: 1   calcipotriene (DOVONOX) 0.005 % ointment, APPLY 1 APPLICATION ON THE SKIN TWICE A DAY (Patient not taking: Reported on 09/16/2020), Disp: 60 g, Rfl: 3   Cyanocobalamin (VITAMIN B-12) 5000 MCG TBDP, Take 1 tablet by mouth every other day. (Patient not taking: No sig reported), Disp: , Rfl:    insulin regular (NOVOLIN R) 100 units/mL injection, INJECT BEFORE MEALS 3 TIMES DAILY, 34 UNITS FOR A REGULAR MEAL OR 20 UNITS FOR A SMALL MEAL (NEEDS OFFICE VISIT) (Patient not taking: No sig  reported), Disp: 90 mL, Rfl: 0   insulin regular (NOVOLIN R) 100 units/mL injection, INJECT BEFORE MEALS THREE TIMES A DAY. INJECT 34 UNITS FOR A REGULAR MEAL OR 20 UNITS FOR SMALL MEAL. **MUST HAVE OFFICE VISI (Patient not taking: No sig reported), Disp: 90 mL, Rfl: 0   lisdexamfetamine (VYVANSE) 20 MG capsule, Take 1 capsule (20 mg total) by mouth daily. (Patient not taking: No sig reported), Disp: 30 capsule, Rfl: 0   triamcinolone (KENALOG) 0.1 %, APPLY TO THE AFFECTED AREA(S) SPARINGLY 2 TIMES DAILY, TAPER USE AS ABLE (Patient not taking: No sig reported), Disp: 454 g, Rfl: 3 Social History   Socioeconomic History   Marital status: Married    Spouse name: Retail banker   Number of children: Not on file   Years of education: 18   Highest education level: Master's degree (e.g., MA, MS, MEng, MEd, MSW, MBA)  Occupational History   Occupation: third grade teacher  Tobacco Use   Smoking status: Never   Smokeless tobacco: Never  Vaping Use   Vaping Use: Never used  Substance and Sexual Activity   Alcohol use: Yes    Comment: occasionally   Drug use: No   Sexual activity: Yes  Other Topics Concern   Not on file  Social History Narrative   Engaged to be married December 8 to [place]   Social Determinants of Health   Financial Resource Strain: Not on [employer] Insecurity: Not on  file  Transportation Needs: Not on file  Physical Activity: Not on file  Stress: Not on file  Social Connections: Not on file  Intimate Partner Violence: Not on file   Family History  Problem Relation Age of Onset   Birth defects Mother        one hand small   Arthritis Father    Diabetes Father    Heart disease Father 66       heart attack   Psoriasis Father    Psoriasis Brother    Stroke Paternal Grandmother        age 36   Cancer Paternal Grandfather     Objective: Office vital signs reviewed. BP 136/84   Pulse 96   Temp 97.9 F (36.6 C)   Ht 5\' 5"  (1.651 m)   Wt (!) 321 lb 6.4 oz (145.8 kg)   LMP 08/09/2020   SpO2 95%   BMI 53.48 kg/m   Physical Examination:  General: Awake, alert, obese, No acute distress Cardio: regular rate and rhythm, S1S2 heard, no murmurs appreciated Pulm: clear to auscultation bilaterally, no wheezes, rhonchi or rales; normal work of breathing on room air MSK: normal gait and station Psych: mood stable, speech normal  Assessment/ Plan: 40 y.o. female   GAD (generalized anxiety disorder)  Abnormal cortisol level - Plan: Cortisol-am, blood, CANCELED: ACTH stimulation, 3 time points  Missed menses - Plan: Beta hCG quant (ref lab)  Type 2 diabetes mellitus with both eyes affected by proliferative retinopathy and macular edema, with long-term current use of insulin (HCC)  Atypical chest pain  Anxiety disorder is stable per her report.  Continue BuSpar.  I have placed an a.m. cortisol level.  I have instructed her to contact her endocrinologist for instructions on timing of medication administration versus checking lab  Beta-hCG ordered given missed menses.  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  We will scan this into the chart.  Continue to follow-up with  Dr. Buddy Duty as scheduled  Sounds like her chest pain was not cardiac in nature.   I have adjusted her Protonix to twice daily dosing.  We discussed that when she becomes pregnant we will have to discontinue this and switch over to Pepcid  Orders Placed This Encounter  Procedures   Cortisol-am, blood    Standing Status:   Future    Standing Expiration Date:   09/16/2021   Beta hCG quant (ref lab)   Meds ordered this encounter  Medications   pantoprazole (PROTONIX) 20 MG tablet    Sig: Take 1 tablet (20 mg total) by mouth 2 (two) times daily before a meal.    Dispense:  180 tablet    Refill:  [place], [place] [phone]

## 2020-09-16 NOTE — Patient Instructions (Signed)
Come in 8am for cortisol test.  There is a risk of increasing serotonin levels with buspar and vyvanse but at low doses I do not think you have a very high risk of this happening.

## 2020-09-17 LAB — BETA HCG QUANT (REF LAB): hCG Quant: <1 m[IU]/mL

## 2020-09-18 ENCOUNTER — Encounter: Payer: Self-pay | Admitting: Family Medicine

## 2020-09-19 NOTE — Progress Notes (Signed)
Pt aware.

## 2020-09-24 ENCOUNTER — Other Ambulatory Visit: Payer: Self-pay | Admitting: Gastroenterology

## 2020-09-25 ENCOUNTER — Other Ambulatory Visit (HOSPITAL_COMMUNITY): Payer: Self-pay

## 2020-09-30 ENCOUNTER — Ambulatory Visit: Payer: 59 | Admitting: Family

## 2020-10-07 ENCOUNTER — Other Ambulatory Visit: Payer: Self-pay | Admitting: Family Medicine

## 2020-10-07 ENCOUNTER — Other Ambulatory Visit (HOSPITAL_COMMUNITY): Payer: Self-pay

## 2020-10-07 DIAGNOSIS — R609 Edema, unspecified: Secondary | ICD-10-CM

## 2020-10-07 DIAGNOSIS — I10 Essential (primary) hypertension: Secondary | ICD-10-CM

## 2020-10-07 MED ORDER — METFORMIN HCL 1000 MG PO TABS
1000.0000 mg | ORAL_TABLET | Freq: Two times a day (BID) | ORAL | 0 refills | Status: DC
  Filled 2020-10-07: qty 60, 30d supply, fill #0

## 2020-10-07 MED ORDER — HYDROCHLOROTHIAZIDE 12.5 MG PO TABS
ORAL_TABLET | Freq: Every day | ORAL | 0 refills | Status: DC
  Filled 2020-10-07: qty 90, 90d supply, fill #0

## 2020-10-09 ENCOUNTER — Other Ambulatory Visit: Payer: Self-pay | Admitting: Family Medicine

## 2020-10-09 ENCOUNTER — Other Ambulatory Visit (HOSPITAL_COMMUNITY): Payer: Self-pay

## 2020-10-09 MED ORDER — BUSPIRONE HCL 10 MG PO TABS
ORAL_TABLET | Freq: Three times a day (TID) | ORAL | 0 refills | Status: DC
Start: 2020-10-09 — End: 2021-01-08
  Filled 2020-10-09: qty 270, 90d supply, fill #0

## 2020-10-10 ENCOUNTER — Other Ambulatory Visit (HOSPITAL_COMMUNITY): Payer: Self-pay

## 2020-10-27 DIAGNOSIS — E113513 Type 2 diabetes mellitus with proliferative diabetic retinopathy with macular edema, bilateral: Secondary | ICD-10-CM | POA: Diagnosis not present

## 2020-10-27 DIAGNOSIS — H35373 Puckering of macula, bilateral: Secondary | ICD-10-CM | POA: Diagnosis not present

## 2020-10-27 DIAGNOSIS — H43823 Vitreomacular adhesion, bilateral: Secondary | ICD-10-CM | POA: Diagnosis not present

## 2020-10-30 ENCOUNTER — Other Ambulatory Visit (HOSPITAL_COMMUNITY): Payer: Self-pay

## 2020-11-05 ENCOUNTER — Telehealth: Payer: Self-pay | Admitting: Family Medicine

## 2020-11-05 NOTE — Telephone Encounter (Signed)
Patient realized she was prescribed the medication Dexamethasone by her endocrinologist to take before she comes in for her cortisol lab work.  She will contact them to find out when she should take it.

## 2020-11-05 NOTE — Telephone Encounter (Signed)
Pt called requesting to speak with nurse. Has question about a medication. Wants to confirm when she is supposed to take it.

## 2020-11-06 ENCOUNTER — Other Ambulatory Visit: Payer: 59

## 2020-11-06 ENCOUNTER — Other Ambulatory Visit: Payer: Self-pay

## 2020-11-06 DIAGNOSIS — R7989 Other specified abnormal findings of blood chemistry: Secondary | ICD-10-CM

## 2020-11-07 ENCOUNTER — Encounter: Payer: Self-pay | Admitting: Family Medicine

## 2020-11-07 LAB — CORTISOL-AM, BLOOD: Cortisol - AM: 0.6 ug/dL — ABNORMAL LOW (ref 6.2–19.4)

## 2020-11-10 DIAGNOSIS — H43823 Vitreomacular adhesion, bilateral: Secondary | ICD-10-CM | POA: Diagnosis not present

## 2020-11-10 DIAGNOSIS — E113513 Type 2 diabetes mellitus with proliferative diabetic retinopathy with macular edema, bilateral: Secondary | ICD-10-CM | POA: Diagnosis not present

## 2020-11-10 DIAGNOSIS — E113511 Type 2 diabetes mellitus with proliferative diabetic retinopathy with macular edema, right eye: Secondary | ICD-10-CM | POA: Diagnosis not present

## 2020-11-11 ENCOUNTER — Other Ambulatory Visit (HOSPITAL_COMMUNITY): Payer: Self-pay

## 2020-11-11 ENCOUNTER — Other Ambulatory Visit: Payer: Self-pay | Admitting: Family Medicine

## 2020-11-11 MED ORDER — METFORMIN HCL 1000 MG PO TABS
1000.0000 mg | ORAL_TABLET | Freq: Two times a day (BID) | ORAL | 1 refills | Status: DC
  Filled 2020-11-11: qty 60, 30d supply, fill #0
  Filled 2020-12-02: qty 60, 30d supply, fill #1

## 2020-11-17 DIAGNOSIS — E113512 Type 2 diabetes mellitus with proliferative diabetic retinopathy with macular edema, left eye: Secondary | ICD-10-CM | POA: Diagnosis not present

## 2020-11-24 ENCOUNTER — Other Ambulatory Visit: Payer: Self-pay | Admitting: Family Medicine

## 2020-11-24 ENCOUNTER — Other Ambulatory Visit (HOSPITAL_COMMUNITY): Payer: Self-pay

## 2020-11-24 DIAGNOSIS — E1159 Type 2 diabetes mellitus with other circulatory complications: Secondary | ICD-10-CM

## 2020-11-24 DIAGNOSIS — I152 Hypertension secondary to endocrine disorders: Secondary | ICD-10-CM

## 2020-11-24 MED ORDER — LISINOPRIL 10 MG PO TABS
ORAL_TABLET | Freq: Every day | ORAL | 0 refills | Status: DC
  Filled 2020-11-24: qty 90, 90d supply, fill #0

## 2020-11-25 ENCOUNTER — Other Ambulatory Visit (HOSPITAL_COMMUNITY): Payer: Self-pay

## 2020-12-02 ENCOUNTER — Other Ambulatory Visit: Payer: Self-pay | Admitting: Pharmacist

## 2020-12-02 ENCOUNTER — Other Ambulatory Visit: Payer: Self-pay

## 2020-12-02 MED FILL — Insulin NPH (Human) (Isophane) Inj 100 Unit/ML: SUBCUTANEOUS | 90 days supply | Qty: 20 | Fill #1 | Status: AC

## 2020-12-02 MED FILL — Glucose Blood Test Strip: 90 days supply | Qty: 300 | Fill #1 | Status: AC

## 2020-12-03 ENCOUNTER — Other Ambulatory Visit (HOSPITAL_COMMUNITY): Payer: Self-pay

## 2020-12-03 MED ORDER — FREESTYLE LITE TEST VI STRP
ORAL_STRIP | 0 refills | Status: DC
  Filled 2020-12-03: qty 300, 100d supply, fill #0
  Filled 2021-02-23: qty 300, 90d supply, fill #0

## 2020-12-04 ENCOUNTER — Other Ambulatory Visit (HOSPITAL_COMMUNITY): Payer: Self-pay

## 2020-12-09 ENCOUNTER — Other Ambulatory Visit (HOSPITAL_COMMUNITY): Payer: Self-pay

## 2020-12-10 ENCOUNTER — Other Ambulatory Visit (HOSPITAL_COMMUNITY): Payer: Self-pay

## 2020-12-10 MED ORDER — "TECHLITE INSULIN SYRINGE 31G X 15/64"" 0.3 ML MISC"
3 refills | Status: DC
  Filled 2020-12-10: qty 400, 90d supply, fill #0
  Filled 2021-02-23: qty 400, 90d supply, fill #1
  Filled 2021-05-21: qty 400, 90d supply, fill #2
  Filled 2021-08-14: qty 400, 90d supply, fill #3

## 2020-12-12 ENCOUNTER — Other Ambulatory Visit (HOSPITAL_COMMUNITY): Payer: Self-pay

## 2020-12-26 ENCOUNTER — Encounter (HOSPITAL_COMMUNITY): Payer: Self-pay | Admitting: Gastroenterology

## 2020-12-26 DIAGNOSIS — E113513 Type 2 diabetes mellitus with proliferative diabetic retinopathy with macular edema, bilateral: Secondary | ICD-10-CM | POA: Diagnosis not present

## 2020-12-26 DIAGNOSIS — H43823 Vitreomacular adhesion, bilateral: Secondary | ICD-10-CM | POA: Diagnosis not present

## 2020-12-26 DIAGNOSIS — H35373 Puckering of macula, bilateral: Secondary | ICD-10-CM | POA: Diagnosis not present

## 2020-12-26 LAB — HM DIABETES EYE EXAM

## 2020-12-29 ENCOUNTER — Other Ambulatory Visit: Payer: Self-pay | Admitting: Family Medicine

## 2020-12-29 DIAGNOSIS — I10 Essential (primary) hypertension: Secondary | ICD-10-CM

## 2020-12-29 DIAGNOSIS — R609 Edema, unspecified: Secondary | ICD-10-CM

## 2020-12-30 ENCOUNTER — Other Ambulatory Visit (HOSPITAL_COMMUNITY): Payer: Self-pay

## 2020-12-30 MED ORDER — HYDROCHLOROTHIAZIDE 12.5 MG PO TABS
ORAL_TABLET | Freq: Every day | ORAL | 0 refills | Status: DC
  Filled 2020-12-30: qty 90, 90d supply, fill #0

## 2021-01-06 ENCOUNTER — Ambulatory Visit (HOSPITAL_COMMUNITY): Admit: 2021-01-06 | Payer: 59 | Admitting: Gastroenterology

## 2021-01-06 ENCOUNTER — Encounter (HOSPITAL_COMMUNITY): Payer: Self-pay

## 2021-01-06 ENCOUNTER — Other Ambulatory Visit: Payer: Self-pay | Admitting: Family Medicine

## 2021-01-06 SURGERY — ESOPHAGOGASTRODUODENOSCOPY (EGD) WITH PROPOFOL
Anesthesia: Monitor Anesthesia Care

## 2021-01-07 ENCOUNTER — Other Ambulatory Visit (HOSPITAL_COMMUNITY): Payer: Self-pay

## 2021-01-07 MED ORDER — METFORMIN HCL 1000 MG PO TABS
1000.0000 mg | ORAL_TABLET | Freq: Two times a day (BID) | ORAL | 1 refills | Status: DC
  Filled 2021-01-07: qty 60, 30d supply, fill #0
  Filled 2021-02-07: qty 60, 30d supply, fill #1

## 2021-01-08 ENCOUNTER — Other Ambulatory Visit (HOSPITAL_COMMUNITY): Payer: Self-pay

## 2021-01-08 ENCOUNTER — Other Ambulatory Visit: Payer: Self-pay | Admitting: Family Medicine

## 2021-01-08 MED ORDER — BUSPIRONE HCL 10 MG PO TABS
ORAL_TABLET | Freq: Three times a day (TID) | ORAL | 0 refills | Status: DC
  Filled 2021-01-08: qty 270, 90d supply, fill #0

## 2021-01-14 ENCOUNTER — Encounter: Payer: Self-pay | Admitting: Family Medicine

## 2021-01-16 ENCOUNTER — Ambulatory Visit: Payer: 59 | Admitting: Family Medicine

## 2021-01-26 ENCOUNTER — Other Ambulatory Visit (HOSPITAL_COMMUNITY): Payer: Self-pay

## 2021-02-07 ENCOUNTER — Other Ambulatory Visit (HOSPITAL_COMMUNITY): Payer: Self-pay

## 2021-02-19 ENCOUNTER — Other Ambulatory Visit: Payer: Self-pay | Admitting: Family Medicine

## 2021-02-19 ENCOUNTER — Other Ambulatory Visit (HOSPITAL_COMMUNITY): Payer: Self-pay

## 2021-02-19 DIAGNOSIS — I10 Essential (primary) hypertension: Secondary | ICD-10-CM

## 2021-02-19 DIAGNOSIS — R609 Edema, unspecified: Secondary | ICD-10-CM

## 2021-02-19 DIAGNOSIS — E1159 Type 2 diabetes mellitus with other circulatory complications: Secondary | ICD-10-CM

## 2021-02-19 MED ORDER — LISINOPRIL 10 MG PO TABS
ORAL_TABLET | Freq: Every day | ORAL | 0 refills | Status: DC
  Filled 2021-02-19: qty 90, 90d supply, fill #0

## 2021-02-19 MED ORDER — HYDROCHLOROTHIAZIDE 12.5 MG PO TABS
ORAL_TABLET | Freq: Every day | ORAL | 0 refills | Status: DC
  Filled 2021-02-19 – 2021-04-03 (×2): qty 90, 90d supply, fill #0

## 2021-02-23 ENCOUNTER — Other Ambulatory Visit (HOSPITAL_COMMUNITY): Payer: Self-pay

## 2021-02-23 MED FILL — Insulin NPH (Human) (Isophane) Inj 100 Unit/ML: SUBCUTANEOUS | 90 days supply | Qty: 20 | Fill #2 | Status: AC

## 2021-02-24 ENCOUNTER — Other Ambulatory Visit (HOSPITAL_COMMUNITY): Payer: Self-pay

## 2021-02-25 ENCOUNTER — Other Ambulatory Visit (HOSPITAL_COMMUNITY): Payer: Self-pay

## 2021-02-28 ENCOUNTER — Other Ambulatory Visit (HOSPITAL_COMMUNITY): Payer: Self-pay

## 2021-03-04 ENCOUNTER — Other Ambulatory Visit (HOSPITAL_COMMUNITY): Payer: Self-pay

## 2021-03-04 ENCOUNTER — Encounter: Payer: Self-pay | Admitting: Family Medicine

## 2021-03-04 ENCOUNTER — Ambulatory Visit: Payer: 59 | Admitting: Family Medicine

## 2021-03-04 VITALS — BP 142/85 | HR 106 | Temp 98.2°F | Ht 65.0 in | Wt 320.0 lb

## 2021-03-04 DIAGNOSIS — Z794 Long term (current) use of insulin: Secondary | ICD-10-CM | POA: Diagnosis not present

## 2021-03-04 DIAGNOSIS — R635 Abnormal weight gain: Secondary | ICD-10-CM | POA: Diagnosis not present

## 2021-03-04 DIAGNOSIS — J069 Acute upper respiratory infection, unspecified: Secondary | ICD-10-CM | POA: Diagnosis not present

## 2021-03-04 DIAGNOSIS — E113513 Type 2 diabetes mellitus with proliferative diabetic retinopathy with macular edema, bilateral: Secondary | ICD-10-CM | POA: Diagnosis not present

## 2021-03-04 LAB — VERITOR FLU A/B WAIVED
Influenza A: NEGATIVE
Influenza B: NEGATIVE

## 2021-03-04 LAB — BAYER DCA HB A1C WAIVED: HB A1C (BAYER DCA - WAIVED): 9.3 % — ABNORMAL HIGH (ref 4.8–5.6)

## 2021-03-04 MED ORDER — BENZONATATE 100 MG PO CAPS
100.0000 mg | ORAL_CAPSULE | Freq: Three times a day (TID) | ORAL | 0 refills | Status: DC | PRN
  Filled 2021-03-04: qty 20, 7d supply, fill #0

## 2021-03-04 MED ORDER — AZITHROMYCIN 250 MG PO TABS: ORAL_TABLET | ORAL | 0 refills | Status: DC

## 2021-03-04 NOTE — Progress Notes (Signed)
Subjective: CC: URI PCP: Janora Norlander, DO KZL:[id number] Amy Ball is a 40 y.o. female presenting to clinic today for:  1. URI Patient reports onset of scratchy throat about a week ago.  Over the weekend she started developing a mild cough.  It is only most recently become productive with clear phlegm.  She just darted using some Mucinex DM which she thinks is helping and today she is felt the best that she has felt in a while.  However, symptoms are still present.  Using over-the-counter remedies currently.  No fevers, purulent nasal discharge.  No shortness of breath.    2.  Diabetes Patient is continued to be managed by her endocrinologist.  Her next office visit will be sometime in January.  She admits that since she has been ill she has had some rising and blood sugar but she reports compliance with her insulins.  She is prescribed a Dexcom but has not started using it yet.  She still struggles with fertility.  She has been previously treated with Victoza for blood sugar and wonders if this is something that she may benefit from again as it did help her maintain a healthier weight.  She notes that her current weight is the heaviest that she is ever been and this worries her; she does not want to pursue any type of gastric bypass surgeries at this time   ROS: Per HPI  Allergies  Allergen Reactions   Amoxicillin-Pot Clavulanate Anaphylaxis   Covid-19 Mrna Vacc (Moderna) Anaphylaxis   Macrobid [Nitrofurantoin Macrocrystal] Swelling and Rash   Basaglar Kwikpen [Insulin Glargine] Swelling   Penicillins    Avelox [Moxifloxacin Hcl In Nacl] Rash   Past Medical History:  Diagnosis Date   Environmental allergies    GAD (generalized anxiety disorder) 12/05/2018   GERD (gastroesophageal reflux disease)    Hypertension    Type 2 diabetes mellitus ([place])    10 years    Current Outpatient Medications:    Ascorbic Acid (VITAMIN C) 1000 MG tablet, Take 1,000 mg by mouth daily., Disp: ,  Rfl:    azithromycin (ZITHROMAX Z-PAK) 250 MG tablet, As directed, Disp: 6 tablet, Rfl: 0   busPIRone (BUSPAR) 10 MG tablet, Take 1 tablet by mouth 3 (three) times daily., Disp: 270 tablet, Rfl: 0   cetirizine (ZYRTEC) 10 MG tablet, Take 10 mg by mouth daily., Disp: , Rfl:    fluticasone (FLONASE) 50 MCG/ACT nasal spray, PLACE 1 SPRAY INTO BOTH NOSTRILS DAILY., Disp: 48 g, Rfl: 1   glucose blood test strip, USE TO CHECK BLOOD SUGAR 3 TIMES DAILY (Patient taking differently: USE TO CHECK BLOOD SUGAR 3 TIMES DAILY), Disp: 300 strip, Rfl: 2   HUMULIN N 100 UNIT/ML injection, Inject 0.1 mLs (10 Units total) into the skin at bedtime. (Patient taking differently: Inject 20 Units into the skin at bedtime.), Disp: 30 mL, Rfl: 3   hydrochlorothiazide (HYDRODIURIL) 12.5 MG tablet, Take 1 tablet by mouth daily., Disp: 90 tablet, Rfl: 0   hydrocortisone 2.5 % ointment, APPLY TO THE AFFECTED AREA(S) OF SKIN 2 TIMES DAILY, Disp: 454 g, Rfl: 3   insulin regular (HUMULIN R) 100 units/mL injection, 3 times a day (just before each meal) 35-25-35 units, and 50-units syringes 4/day (Patient taking differently: Inject 30 Units into the skin 3 (three) times daily before meals.), Disp: 100 mL, Rfl: 3   Insulin Syringe-Needle U-100 (TECHLITE INSULIN SYRINGE) 31G X 15/64" 0.3 ML MISC, Use as directed 4 times a day to inject  insulin, Disp: 400 each, Rfl: 3   Insulin Syringe-Needle U-100 31G X 5/16" 0.3 ML MISC, USE TO ADMINISTER INSULIN 4 TIMES DAILY AS DIRECTED, Disp: 400 each, Rfl: 3   Lancets (FREESTYLE) lancets, 1 each 3 (three) times daily., Disp: , Rfl:    lisinopril (ZESTRIL) 10 MG tablet, TAKE 1 TABLET BY MOUTH ONCE A DAY, Disp: 90 tablet, Rfl: 0   metFORMIN (GLUCOPHAGE) 1000 MG tablet, Take 1 tablet by mouth 2  times daily with a meal., Disp: 60 tablet, Rfl: 1   NOVOFINE 32G X 6 MM MISC, , Disp: , Rfl:    pantoprazole (PROTONIX) 20 MG tablet, Take 1 tablet (20 mg total) by mouth 2 (two) times daily before a meal.,  Disp: 180 tablet, Rfl: 2   Prenatal Vit-Fe Fumarate-FA (PRENATAL VITAMIN PO), Take 1 tablet by mouth daily., Disp: , Rfl:    triamcinolone (KENALOG) 0.1 %, APPLY TO THE AFFECTED AREA(S) SPARINGLY 2 TIMES DAILY, TAPER USE AS ABLE, Disp: 454 g, Rfl: 3   TRUEPLUS INSULIN SYRINGE 30G X 5/16" 1 ML MISC, USE TO INJECT INSULIN 4 TIMES DAILY, Disp: 300 each, Rfl: 3   betamethasone dipropionate 0.05 % lotion, APPLY TO THE SCALP ONCE A DAY TAPER USE AS ABLE (Patient not taking: Reported on 09/16/2020), Disp: 120 mL, Rfl: 3   betamethasone valerate ointment (VALISONE) 0.1 %, APPLY 2 TIMES DAILY TO AFFECTED PSORIATIC AREAS (Patient not taking: Reported on 09/16/2020), Disp: 90 g, Rfl: 1   calcipotriene (DOVONOX) 0.005 % ointment, APPLY 1 APPLICATION ON THE SKIN TWICE A DAY (Patient not taking: Reported on 09/16/2020), Disp: 60 g, Rfl: 3   Continuous Blood Gluc Receiver (DEXCOM G6 RECEIVER) DEVI, Use as directed (Patient not taking: Reported on 03/04/2021), Disp: 1 each, Rfl: 0   Continuous Blood Gluc Sensor (DEXCOM G6 SENSOR) MISC, Change every 10 days (Patient not taking: Reported on 03/04/2021), Disp: 9 each, Rfl: 3   Continuous Blood Gluc Transmit (DEXCOM G6 TRANSMITTER) MISC, Use as directed every 90 days (Patient not taking: Reported on 03/04/2021), Disp: 1 each, Rfl: 3   Cyanocobalamin (VITAMIN B-12) 5000 MCG TBDP, Take 1 tablet by mouth every other day. (Patient not taking: Reported on 09/02/2020), Disp: , Rfl:    EPINEPHrine 0.3 mg/0.3 mL IJ SOAJ injection, Inject 0.3 mLs (0.3 mg total) into the muscle as needed for anaphylaxis. (Patient not taking: Reported on 03/04/2021), Disp: 1 each, Rfl: 0   lisdexamfetamine (VYVANSE) 20 MG capsule, Take 1 capsule (20 mg total) by mouth daily. (Patient not taking: Reported on 03/14/2020), Disp: 30 capsule, Rfl: 0 Social History   Socioeconomic History   Marital status: Married    Spouse name: Hunter   Number of children: Not on file   Years of education: 18    Highest education level: Master's degree (e.g., MA, MS, MEng, MEd, MSW, MBA)  Occupational History   Occupation: third grade teacher  Tobacco Use   Smoking status: Never   Smokeless tobacco: Never  Vaping Use   Vaping Use: Never used  Substance and Sexual Activity   Alcohol use: Yes    Comment: occasionally   Drug use: No   Sexual activity: Yes  Other Topics Concern   Not on file  Social History Narrative   Engaged to be married December 8 to [place] Determinants of Health   Financial Resource Strain: Not on [employer] Insecurity: Not on file  Transportation Needs: Not on file  Physical Activity: Not on file  Stress:  Not on file  Social Connections: Not on file  Intimate Partner Violence: Not on file   Family History  Problem Relation Age of Onset   Birth defects Mother        one hand small   Arthritis Father    Diabetes Father    Heart disease Father 38       heart attack   Psoriasis Father    Psoriasis Brother    Stroke Paternal Grandmother        age 47   Cancer Paternal Grandfather     Objective: Office vital signs reviewed. BP 140/90   Pulse (!) 106   Temp 98.2 F (36.8 C)   Ht _0  (1.651 m)   Wt (!) 320 lb (145.2 kg)   BMI 53.25 kg/m   Physical Examination:  General: Awake, alert, morbidly obese, No acute distress HEENT: Normal; sclera white.  Moist mucous membranes.  TMs intact bilaterally.  Nasal turbinates without purulent discharge.    Neck: No masses palpated. No lymphadenopathy Cardio: regular rate and rhythm, S1S2 heard, no murmurs appreciated Pulm: clear to auscultation bilaterally, no wheezes, rhonchi or rales; normal work of breathing on room air   Assessment/ Plan: 40 y.o. female   Type 2 diabetes mellitus with both eyes affected by proliferative retinopathy and macular edema, with long-term current use of insulin ([place]) - Plan: CMP14+EGFR, Lipid panel, Bayer DCA Hb A1c Waived  Weight gain  Upper respiratory tract  infection, unspecified type - Plan: Novel Coronavirus, NAA ([facility]), Veritor Flu A/B Waived, benzonatate (TESSALON PERLES) 100 MG capsule  Diabetes remains uncontrolled with A1c of 9.3 today.  Will CC endocrinology.  We discussed that it may be to her benefit to pursue weight loss prior to trying to conceive so that she minimizes her risk given advanced maternal age.  I wonder if she would benefit from going on to something like Victoza, which I have seen used in the fertility clinics to promote healthy progesterone levels.  She of course would need a bridge of progesterone once becoming pregnant and being transitioned over to a more safe medication for pregnancy.  She seemed to be interested in this.  Alternatively, could consider aggressive weight loss with something like Mounjaro, would absolutely not recommend becoming pregnant on that medication however as there has been quite a noticeable progesterone drop with discontinuation of that medication.  I believe her upper respiratory infection to be viral.  However I given her a pocket prescription for Z-Pak since I will be out of town soon.  She understands indication for use.  Tessalon Perles sent for as needed use.  We will test her for flu and COVID today  Orders Placed This Encounter  Procedures   CMP14+EGFR   Lipid panel   Bayer DCA Hb A1c Waived   No orders of the defined types were placed in this encounter.    Janora Norlander, DO [place] [phone]

## 2021-03-05 ENCOUNTER — Other Ambulatory Visit (HOSPITAL_COMMUNITY): Payer: Self-pay

## 2021-03-05 ENCOUNTER — Encounter: Payer: Self-pay | Admitting: Family Medicine

## 2021-03-05 LAB — LIPID PANEL
Chol/HDL Ratio: 4.5 ratio — ABNORMAL HIGH (ref 0.0–4.4)
Cholesterol, Total: 134 mg/dL (ref 100–199)
HDL: 30 mg/dL — ABNORMAL LOW (ref >39)
LDL Chol Calc (NIH): 49 mg/dL (ref 0–99)
Triglycerides: 365 mg/dL — ABNORMAL HIGH (ref 0–149)
VLDL Cholesterol Cal: 55 mg/dL — ABNORMAL HIGH (ref 5–40)

## 2021-03-05 LAB — NOVEL CORONAVIRUS, NAA: SARS-CoV-2, NAA: NOT DETECTED

## 2021-03-05 LAB — CMP14+EGFR
ALT: 26 IU/L (ref 0–32)
AST: 16 IU/L (ref 0–40)
Albumin/Globulin Ratio: 1.4 (ref 1.2–2.2)
Albumin: 4 g/dL (ref 3.8–4.8)
Alkaline Phosphatase: 80 IU/L (ref 44–121)
BUN/Creatinine Ratio: 13 (ref 9–23)
BUN: 10 mg/dL (ref 6–20)
Bilirubin Total: 0.3 mg/dL (ref 0.0–1.2)
CO2: 25 mmol/L (ref 20–29)
Calcium: 9.1 mg/dL (ref 8.7–10.2)
Chloride: 92 mmol/L — ABNORMAL LOW (ref 96–106)
Creatinine, Ser: 0.76 mg/dL (ref 0.57–1.00)
Globulin, Total: 2.8 g/dL (ref 1.5–4.5)
Glucose: 312 mg/dL — ABNORMAL HIGH (ref 70–99)
Potassium: 4.4 mmol/L (ref 3.5–5.2)
Sodium: 133 mmol/L — ABNORMAL LOW (ref 134–144)
Total Protein: 6.8 g/dL (ref 6.0–8.5)
eGFR: 102 mL/min/{1.73_m2} (ref >59)

## 2021-03-11 ENCOUNTER — Encounter: Payer: Self-pay | Admitting: Family Medicine

## 2021-03-13 ENCOUNTER — Encounter: Payer: Self-pay | Admitting: Family Medicine

## 2021-03-13 ENCOUNTER — Ambulatory Visit: Payer: 59 | Admitting: Family Medicine

## 2021-03-13 DIAGNOSIS — Z20828 Contact with and (suspected) exposure to other viral communicable diseases: Secondary | ICD-10-CM | POA: Diagnosis not present

## 2021-03-13 DIAGNOSIS — J101 Influenza due to other identified influenza virus with other respiratory manifestations: Secondary | ICD-10-CM

## 2021-03-13 DIAGNOSIS — Z20822 Contact with and (suspected) exposure to covid-19: Secondary | ICD-10-CM | POA: Diagnosis not present

## 2021-03-13 MED ORDER — OSELTAMIVIR PHOSPHATE 75 MG PO CAPS
75.0000 mg | ORAL_CAPSULE | Freq: Two times a day (BID) | ORAL | 0 refills | Status: AC
Start: 2021-03-13 — End: 2021-03-18

## 2021-03-13 NOTE — Progress Notes (Signed)
Telephone visit  Subjective: CC: Flu? PCP: Janora Norlander, DO PIR:[id number] H Dowell is a 40 y.o. female calls for telephone consult today. Patient provides verbal consent for consult held via phone.  Due to COVID-19 pandemic this visit was conducted virtually. This visit type was conducted due to national recommendations for restrictions regarding the COVID-19 Pandemic (e.g. social distancing, sheltering in place) in an effort to limit this patient's exposure and mitigate transmission in our community. All issues noted in this document were discussed and addressed.  A physical exam was not performed with this format.   Location of patient: home Location of provider: [facility] Others present for call: none  1. Flu Patient reports that she got abruptly worse yesterday and she started taking Coriciden for flu symptoms.  She reports rhinorrhea, congestion.  No fevers, myalgia.  She has had many sick contacts with flu in the last week.  She is getting tested at a pharmacy in [place] this afternoon.   ROS: Per HPI  Allergies  Allergen Reactions   Amoxicillin-Pot Clavulanate Anaphylaxis   Covid-19 Mrna Vacc (Moderna) Anaphylaxis   Macrobid [Nitrofurantoin Macrocrystal] Swelling and Rash   Basaglar Kwikpen [Insulin Glargine] Swelling   Penicillins    Avelox [Moxifloxacin Hcl In Nacl] Rash   Past Medical History:  Diagnosis Date   Environmental allergies    GAD (generalized anxiety disorder) 12/05/2018   GERD (gastroesophageal reflux disease)    Hypertension    Type 2 diabetes mellitus ([facility])    10 years    Current Outpatient Medications:    Ascorbic Acid (VITAMIN C) 1000 MG tablet, Take 1,000 mg by mouth daily., Disp: , Rfl:    azithromycin (ZITHROMAX) 250 MG tablet, Take 2 tablets today, then take 1 tablet daily until gone., Disp: 6 tablet, Rfl: 0   benzonatate (TESSALON PERLES) 100 MG capsule, Take 1 capsule (100 mg total) by mouth 3 (three) times daily as needed for cough., Disp: 20  capsule, Rfl: 0   betamethasone dipropionate 0.05 % lotion, APPLY TO THE SCALP ONCE A DAY TAPER USE AS ABLE (Patient not taking: Reported on 09/16/2020), Disp: 120 mL, Rfl: 3   betamethasone valerate ointment (VALISONE) 0.1 %, APPLY 2 TIMES DAILY TO AFFECTED PSORIATIC AREAS (Patient not taking: Reported on 09/16/2020), Disp: 90 g, Rfl: 1   busPIRone (BUSPAR) 10 MG tablet, Take 1 tablet by mouth 3 (three) times daily., Disp: 270 tablet, Rfl: 0   calcipotriene (DOVONOX) 0.005 % ointment, APPLY 1 APPLICATION ON THE SKIN TWICE A DAY (Patient not taking: Reported on 09/16/2020), Disp: 60 g, Rfl: 3   cetirizine (ZYRTEC) 10 MG tablet, Take 10 mg by mouth daily., Disp: , Rfl:    Continuous Blood Gluc Receiver ([place]) DEVI, Use as directed (Patient not taking: Reported on 03/04/2021), Disp: 1 each, Rfl: 0   Continuous Blood Gluc Sensor (DEXCOM G6 SENSOR) MISC, Change every 10 days (Patient not taking: Reported on 03/04/2021), Disp: 9 each, Rfl: 3   Continuous Blood Gluc Transmit (DEXCOM G6 TRANSMITTER) MISC, Use as directed every 90 days (Patient not taking: Reported on 03/04/2021), Disp: 1 each, Rfl: 3   Cyanocobalamin (VITAMIN B-12) 5000 MCG TBDP, Take 1 tablet by mouth every other day. (Patient not taking: Reported on 09/02/2020), Disp: , Rfl:    EPINEPHrine 0.3 mg/0.3 mL IJ SOAJ injection, Inject 0.3 mLs (0.3 mg total) into the muscle as needed for anaphylaxis. (Patient not taking: Reported on 03/04/2021), Disp: 1 each, Rfl: 0   fluticasone (FLONASE) 50 MCG/ACT nasal  spray, PLACE 1 SPRAY INTO BOTH NOSTRILS DAILY., Disp: 48 g, Rfl: 1   glucose blood test strip, USE TO CHECK BLOOD SUGAR 3 TIMES DAILY (Patient taking differently: USE TO CHECK BLOOD SUGAR 3 TIMES DAILY), Disp: 300 strip, Rfl: 2   HUMULIN N 100 UNIT/ML injection, Inject 0.1 mLs (10 Units total) into the skin at bedtime. (Patient taking differently: Inject 20 Units into the skin at bedtime.), Disp: 30 mL, Rfl: 3   hydrochlorothiazide  (HYDRODIURIL) 12.5 MG tablet, Take 1 tablet by mouth daily., Disp: 90 tablet, Rfl: 0   hydrocortisone 2.5 % ointment, APPLY TO THE AFFECTED AREA(S) OF SKIN 2 TIMES DAILY, Disp: 454 g, Rfl: 3   insulin regular (HUMULIN R) 100 units/mL injection, 3 times a day (just before each meal) 35-25-35 units, and 50-units syringes 4/day (Patient taking differently: Inject 30 Units into the skin 3 (three) times daily before meals.), Disp: 100 mL, Rfl: 3   Insulin Syringe-Needle U-100 (TECHLITE INSULIN SYRINGE) 31G X 15/64" 0.3 ML MISC, Use as directed 4 times a day to inject insulin, Disp: 400 each, Rfl: 3   Insulin Syringe-Needle U-100 31G X 5/16" 0.3 ML MISC, USE TO ADMINISTER INSULIN 4 TIMES DAILY AS DIRECTED, Disp: 400 each, Rfl: 3   Lancets (FREESTYLE) lancets, 1 each 3 (three) times daily., Disp: , Rfl:    lisdexamfetamine (VYVANSE) 20 MG capsule, Take 1 capsule (20 mg total) by mouth daily. (Patient not taking: Reported on 03/14/2020), Disp: 30 capsule, Rfl: 0   lisinopril (ZESTRIL) 10 MG tablet, TAKE 1 TABLET BY MOUTH ONCE A DAY, Disp: 90 tablet, Rfl: 0   metFORMIN (GLUCOPHAGE) 1000 MG tablet, Take 1 tablet by mouth 2  times daily with a meal., Disp: 60 tablet, Rfl: 1   NOVOFINE 32G X 6 MM MISC, , Disp: , Rfl:    pantoprazole (PROTONIX) 20 MG tablet, Take 1 tablet (20 mg total) by mouth 2 (two) times daily before a meal., Disp: 180 tablet, Rfl: 2   Prenatal Vit-Fe Fumarate-FA (PRENATAL VITAMIN PO), Take 1 tablet by mouth daily., Disp: , Rfl:    triamcinolone (KENALOG) 0.1 %, APPLY TO THE AFFECTED AREA(S) SPARINGLY 2 TIMES DAILY, TAPER USE AS ABLE, Disp: 454 g, Rfl: 3   TRUEPLUS INSULIN SYRINGE 30G X 5/16" 1 ML MISC, USE TO INJECT INSULIN 4 TIMES DAILY, Disp: 300 each, Rfl: 3  Assessment/ Plan: 40 y.o. female   Influenza A - Plan: oseltamivir (TAMIFLU) 75 MG capsule  Tamiflu sent.  Continue hydration.  Red flags discussed. Follow up as needed.   Start time: 11:20am; 5:02pm End time: 11:22am;  5:07  Total time spent on patient care (including telephone call/ virtual visit): 7 minutes  [place], [place] [phone]

## 2021-03-14 ENCOUNTER — Other Ambulatory Visit: Payer: Self-pay | Admitting: Family Medicine

## 2021-03-14 ENCOUNTER — Other Ambulatory Visit (HOSPITAL_COMMUNITY): Payer: Self-pay

## 2021-03-16 ENCOUNTER — Other Ambulatory Visit (HOSPITAL_COMMUNITY): Payer: Self-pay

## 2021-03-16 MED ORDER — METFORMIN HCL 1000 MG PO TABS
1000.0000 mg | ORAL_TABLET | Freq: Two times a day (BID) | ORAL | 0 refills | Status: DC
  Filled 2021-03-16: qty 180, 90d supply, fill #0

## 2021-03-17 ENCOUNTER — Encounter: Payer: Self-pay | Admitting: Family Medicine

## 2021-03-17 ENCOUNTER — Ambulatory Visit (INDEPENDENT_AMBULATORY_CARE_PROVIDER_SITE_OTHER): Payer: 59 | Admitting: Family Medicine

## 2021-03-17 DIAGNOSIS — J019 Acute sinusitis, unspecified: Secondary | ICD-10-CM | POA: Diagnosis not present

## 2021-03-17 DIAGNOSIS — B9689 Other specified bacterial agents as the cause of diseases classified elsewhere: Secondary | ICD-10-CM | POA: Diagnosis not present

## 2021-03-17 MED ORDER — AZITHROMYCIN 250 MG PO TABS: ORAL_TABLET | ORAL | 0 refills | Status: DC

## 2021-03-17 NOTE — Telephone Encounter (Signed)
Ok to put her on a video visit and I will call during lunch

## 2021-03-17 NOTE — Progress Notes (Signed)
Phone visit  Subjective: CC: ?secondary bacterial infection PCP: Janora Norlander, DO SEG:[id number] Amy Ball is a 40 y.o. female. Patient provides verbal consent for consult held via phone.  Due to COVID-19 pandemic this visit was conducted virtually. This visit type was conducted due to national recommendations for restrictions regarding the COVID-19 Pandemic (e.g. social distancing, sheltering in place) in an effort to limit this patient's exposure and mitigate transmission in our community. All issues noted in this document were discussed and addressed.  A physical exam was not performed with this format.   Location of patient: home Location of provider: [facility] Others present for call: none  1. Recent flu infection She is almost totally recovered from influenza but thinks that she may be having a secondary bacterial infection on the right sinus.  She reports pain that radiates from her right cheek down into her teeth and ear.  She has had some yellowish drainage from that side of her nose.  She has been using Mucinex DM, sinus rinses, Flonase and even started some Afrin today in efforts to try and make things better.  She has 1 more day of Tamiflu left.  No recurrent fevers   ROS: Per HPI  Allergies  Allergen Reactions   Amoxicillin-Pot Clavulanate Anaphylaxis   Covid-19 Mrna Vacc (Moderna) Anaphylaxis   Macrobid [Nitrofurantoin Macrocrystal] Swelling and Rash   Basaglar Kwikpen [Insulin Glargine] Swelling   Penicillins    Avelox [Moxifloxacin Hcl In Nacl] Rash   Past Medical History:  Diagnosis Date   Environmental allergies    GAD (generalized anxiety disorder) 12/05/2018   GERD (gastroesophageal reflux disease)    Hypertension    Type 2 diabetes mellitus ([facility])    10 years    Current Outpatient Medications:    Ascorbic Acid (VITAMIN C) 1000 MG tablet, Take 1,000 mg by mouth daily., Disp: , Rfl:    benzonatate (TESSALON PERLES) 100 MG capsule, Take 1 capsule (100 mg total)  by mouth 3 (three) times daily as needed for cough., Disp: 20 capsule, Rfl: 0   betamethasone dipropionate 0.05 % lotion, APPLY TO THE SCALP ONCE A DAY TAPER USE AS ABLE (Patient not taking: Reported on 09/16/2020), Disp: 120 mL, Rfl: 3   betamethasone valerate ointment (VALISONE) 0.1 %, APPLY 2 TIMES DAILY TO AFFECTED PSORIATIC AREAS (Patient not taking: Reported on 09/16/2020), Disp: 90 g, Rfl: 1   busPIRone (BUSPAR) 10 MG tablet, Take 1 tablet by mouth 3 (three) times daily., Disp: 270 tablet, Rfl: 0   calcipotriene (DOVONOX) 0.005 % ointment, APPLY 1 APPLICATION ON THE SKIN TWICE A DAY (Patient not taking: Reported on 09/16/2020), Disp: 60 g, Rfl: 3   cetirizine (ZYRTEC) 10 MG tablet, Take 10 mg by mouth daily., Disp: , Rfl:    Continuous Blood Gluc Receiver ([place]) DEVI, Use as directed (Patient not taking: Reported on 03/04/2021), Disp: 1 each, Rfl: 0   Continuous Blood Gluc Sensor (DEXCOM G6 SENSOR) MISC, Change every 10 days (Patient not taking: Reported on 03/04/2021), Disp: 9 each, Rfl: 3   Continuous Blood Gluc Transmit (DEXCOM G6 TRANSMITTER) MISC, Use as directed every 90 days (Patient not taking: Reported on 03/04/2021), Disp: 1 each, Rfl: 3   Cyanocobalamin (VITAMIN B-12) 5000 MCG TBDP, Take 1 tablet by mouth every other day. (Patient not taking: Reported on 09/02/2020), Disp: , Rfl:    EPINEPHrine 0.3 mg/0.3 mL IJ SOAJ injection, Inject 0.3 mLs (0.3 mg total) into the muscle as needed for anaphylaxis. (Patient not taking: Reported  on 03/04/2021), Disp: 1 each, Rfl: 0   fluticasone (FLONASE) 50 MCG/ACT nasal spray, PLACE 1 SPRAY INTO BOTH NOSTRILS DAILY., Disp: 48 g, Rfl: 1   glucose blood test strip, USE TO CHECK BLOOD SUGAR 3 TIMES DAILY (Patient taking differently: USE TO CHECK BLOOD SUGAR 3 TIMES DAILY), Disp: 300 strip, Rfl: 2   HUMULIN N 100 UNIT/ML injection, Inject 0.1 mLs (10 Units total) into the skin at bedtime. (Patient taking differently: Inject 20 Units into the  skin at bedtime.), Disp: 30 mL, Rfl: 3   hydrochlorothiazide (HYDRODIURIL) 12.5 MG tablet, Take 1 tablet by mouth daily., Disp: 90 tablet, Rfl: 0   hydrocortisone 2.5 % ointment, APPLY TO THE AFFECTED AREA(S) OF SKIN 2 TIMES DAILY, Disp: 454 g, Rfl: 3   insulin regular (HUMULIN R) 100 units/mL injection, 3 times a day (just before each meal) 35-25-35 units, and 50-units syringes 4/day (Patient taking differently: Inject 30 Units into the skin 3 (three) times daily before meals.), Disp: 100 mL, Rfl: 3   Insulin Syringe-Needle U-100 (TECHLITE INSULIN SYRINGE) 31G X 15/64" 0.3 ML MISC, Use as directed 4 times a day to inject insulin, Disp: 400 each, Rfl: 3   Insulin Syringe-Needle U-100 31G X 5/16" 0.3 ML MISC, USE TO ADMINISTER INSULIN 4 TIMES DAILY AS DIRECTED, Disp: 400 each, Rfl: 3   Lancets (FREESTYLE) lancets, 1 each 3 (three) times daily., Disp: , Rfl:    lisinopril (ZESTRIL) 10 MG tablet, TAKE 1 TABLET BY MOUTH ONCE A DAY, Disp: 90 tablet, Rfl: 0   metFORMIN (GLUCOPHAGE) 1000 MG tablet, Take 1 tablet by mouth 2  times daily with a meal., Disp: 180 tablet, Rfl: 0   NOVOFINE 32G X 6 MM MISC, , Disp: , Rfl:    oseltamivir (TAMIFLU) 75 MG capsule, Take 1 capsule (75 mg total) by mouth 2 (two) times daily for 5 days., Disp: 10 capsule, Rfl: 0   pantoprazole (PROTONIX) 20 MG tablet, Take 1 tablet (20 mg total) by mouth 2 (two) times daily before a meal., Disp: 180 tablet, Rfl: 2   Prenatal Vit-Fe Fumarate-FA (PRENATAL VITAMIN PO), Take 1 tablet by mouth daily., Disp: , Rfl:    triamcinolone (KENALOG) 0.1 %, APPLY TO THE AFFECTED AREA(S) SPARINGLY 2 TIMES DAILY, TAPER USE AS ABLE, Disp: 454 g, Rfl: 3   TRUEPLUS INSULIN SYRINGE 30G X 5/16" 1 ML MISC, USE TO INJECT INSULIN 4 TIMES DAILY, Disp: 300 each, Rfl: 3  Assessment/ Plan: 40 y.o. female   Acute bacterial sinusitis - Plan: azithromycin (ZITHROMAX) 250 MG tablet  Secondary bacterial sinusitis on the right present.  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  Continue sinus rinses, Afrin nasal spray, Flonase if needed.  Follow-up as needed  Start time: 12:48pm End time: 12:55pm  Total time spent on patient care (including video visit/ documentation): 7 minutes  [place], [place] [phone]

## 2021-03-23 DIAGNOSIS — H35373 Puckering of macula, bilateral: Secondary | ICD-10-CM | POA: Diagnosis not present

## 2021-03-23 DIAGNOSIS — H2513 Age-related nuclear cataract, bilateral: Secondary | ICD-10-CM | POA: Diagnosis not present

## 2021-03-23 DIAGNOSIS — E113513 Type 2 diabetes mellitus with proliferative diabetic retinopathy with macular edema, bilateral: Secondary | ICD-10-CM | POA: Diagnosis not present

## 2021-03-23 DIAGNOSIS — H43823 Vitreomacular adhesion, bilateral: Secondary | ICD-10-CM | POA: Diagnosis not present

## 2021-03-31 ENCOUNTER — Other Ambulatory Visit (HOSPITAL_COMMUNITY): Payer: Self-pay

## 2021-04-03 ENCOUNTER — Other Ambulatory Visit (HOSPITAL_COMMUNITY): Payer: Self-pay

## 2021-04-09 ENCOUNTER — Encounter: Payer: Self-pay | Admitting: Cardiology

## 2021-04-09 ENCOUNTER — Ambulatory Visit: Payer: 59 | Admitting: Cardiology

## 2021-04-09 VITALS — BP 152/90 | HR 97 | Ht 65.0 in | Wt 324.4 lb

## 2021-04-09 DIAGNOSIS — R000 Tachycardia, unspecified: Secondary | ICD-10-CM | POA: Diagnosis not present

## 2021-04-09 NOTE — Patient Instructions (Addendum)

## 2021-04-09 NOTE — Progress Notes (Signed)
Cardiology Office Note  Date: 04/09/2021   ID: Amy Ball, DOB 1980-12-13, MRN [id number]  PCP:  Janora Norlander, DO  Cardiologist:  Rozann Lesches, MD Electrophysiologist:  None   Chief Complaint  Patient presents with   Elevated heart rate    History of Present Illness: Amy Ball is a 41 y.o. female seen in consultation back in August 2020.  She was seen at that time for evaluation of intermittent chest discomfort with atypical features in the setting of type 2 diabetes mellitus and family history of premature CAD.  We did discuss coronary CTA, however ultimately she elected to hold off as her symptoms resolved.  She states that she has been doing reasonably well over the last few years.  She is a third grade school teacher.  Has been going to [employer] to exercise with her husband, does a 30-minute circuit.  She does not report any exertional chest pain.  Does have trouble with anxiety.  She states that she scheduled this visit for follow-up after her apple watch had given her two high heart rate indications (120-130).  She did not get in atrial fibrillation alert however.  Both events occurred at rest.  The first one occurred while she was at her new job sitting in a meeting.  The second time it occurred when she was at an [employer] game.  She does not report any sense of palpitations.  I reviewed her medications which are noted below.  I personally reviewed her ECG today which shows normal sinus rhythm.  Past Medical History:  Diagnosis Date   Environmental allergies    GAD (generalized anxiety disorder) 12/05/2018   GERD (gastroesophageal reflux disease)    Hypertension    Type 2 diabetes mellitus ([place])     Past Surgical History:  Procedure Laterality Date   FRACTURE SURGERY  2007   right ankle    Current Outpatient Medications  Medication Sig Dispense Refill   Ascorbic Acid (VITAMIN C) 1000 MG tablet Take 1,000 mg by mouth daily.      betamethasone dipropionate 0.05 % lotion APPLY TO THE SCALP ONCE A DAY TAPER USE AS ABLE 120 mL 3   betamethasone valerate ointment (VALISONE) 0.1 % APPLY 2 TIMES DAILY TO AFFECTED PSORIATIC AREAS 90 g 1   busPIRone (BUSPAR) 10 MG tablet Take 1 tablet by mouth 3 (three) times daily. 270 tablet 0   calcipotriene (DOVONOX) 0.005 % ointment APPLY 1 APPLICATION ON THE SKIN TWICE A DAY 60 g 3   cetirizine (ZYRTEC) 10 MG tablet Take 10 mg by mouth daily.     Continuous Blood Gluc Receiver (DEXCOM G6 RECEIVER) DEVI Use as directed 1 each 0   Continuous Blood Gluc Sensor (DEXCOM G6 SENSOR) MISC Change every 10 days 9 each 3   Continuous Blood Gluc Transmit (DEXCOM G6 TRANSMITTER) MISC Use as directed every 90 days 1 each 3   EPINEPHrine 0.3 mg/0.3 mL IJ SOAJ injection Inject 0.3 mLs (0.3 mg total) into the muscle as needed for anaphylaxis. 1 each 0   fluticasone (FLONASE) 50 MCG/ACT nasal spray PLACE 1 SPRAY INTO BOTH NOSTRILS DAILY. 48 g 1   glucose blood test strip USE TO CHECK BLOOD SUGAR 3 TIMES DAILY (Patient taking differently: USE TO CHECK BLOOD SUGAR 3 TIMES DAILY) 300 strip 2   HUMULIN N 100 UNIT/ML injection Inject 0.1 mLs (10 Units total) into the skin at bedtime. (Patient taking differently: Inject 20 Units into the skin  at bedtime.) 30 mL 3   hydrochlorothiazide (HYDRODIURIL) 12.5 MG tablet Take 1 tablet by mouth daily. 90 tablet 0   hydrocortisone 2.5 % ointment APPLY TO THE AFFECTED AREA(S) OF SKIN 2 TIMES DAILY 454 g 3   insulin regular (HUMULIN R) 100 units/mL injection 3 times a day (just before each meal) 35-25-35 units, and 50-units syringes 4/day (Patient taking differently: Inject 30 Units into the skin 3 (three) times daily before meals.) 100 mL 3   Insulin Syringe-Needle U-100 (TECHLITE INSULIN SYRINGE) 31G X 15/64" 0.3 ML MISC Use as directed 4 times a day to inject insulin 400 each 3   Insulin Syringe-Needle U-100 31G X 5/16" 0.3 ML MISC USE TO ADMINISTER INSULIN 4 TIMES DAILY AS  DIRECTED 400 each 3   Lancets (FREESTYLE) lancets 1 each 3 (three) times daily.     lisinopril (ZESTRIL) 10 MG tablet TAKE 1 TABLET BY MOUTH ONCE A DAY 90 tablet 0   metFORMIN (GLUCOPHAGE) 1000 MG tablet Take 1 tablet by mouth 2  times daily with a meal. 180 tablet 0   NOVOFINE 32G X 6 MM MISC      pantoprazole (PROTONIX) 20 MG tablet Take 1 tablet (20 mg total) by mouth 2 (two) times daily before a meal. 180 tablet 2   Prenatal Vit-Fe Fumarate-FA (PRENATAL VITAMIN PO) Take 1 tablet by mouth daily.     triamcinolone (KENALOG) 0.1 % APPLY TO THE AFFECTED AREA(S) SPARINGLY 2 TIMES DAILY, TAPER USE AS ABLE 454 g 3   TRUEPLUS INSULIN SYRINGE 30G X 5/16" 1 ML MISC USE TO INJECT INSULIN 4 TIMES DAILY 300 each 3   No current facility-administered medications for this visit.   Allergies:  Amoxicillin-pot clavulanate, Covid-19 mrna vacc (moderna), Macrobid [nitrofurantoin macrocrystal], Basaglar kwikpen [insulin glargine], Penicillins, and Avelox [moxifloxacin hcl in nacl]   ROS: No sense of palpitations or syncope.  Physical Exam: VS:  BP (!) 152/90    Pulse 97    Ht 5\' 5"  (1.651 m)    Wt (!) 324 lb 6.4 oz (147.1 kg)    SpO2 97%    BMI 53.98 kg/m , BMI Body mass index is 53.98 kg/m.  Wt Readings from Last 3 Encounters:  04/09/21 (!) 324 lb 6.4 oz (147.1 kg)  03/04/21 (!) 320 lb (145.2 kg)  09/16/20 (!) 321 lb 6.4 oz (145.8 kg)    General: Patient appears comfortable at rest. HEENT: Conjunctiva and lids normal, wearing a mask. Neck: Supple, no elevated JVP or carotid bruits, no thyromegaly. Lungs: Clear to auscultation, nonlabored breathing at rest. Cardiac: Regular rate and rhythm, no S3 or significant systolic murmur, no pericardial rub. Extremities: No pitting edema.  ECG:  An ECG dated 09/02/2020 was personally reviewed today and demonstrated:  Sinus tachycardia with borderline low voltage.  Recent Labwork: 09/02/2020: Hemoglobin 13.2; Platelets 368 03/04/2021: ALT 26; AST 16; BUN 10;  Creatinine, Ser 0.76; Potassium 4.4; Sodium 133     Component Value Date/Time   CHOL 134 03/04/2021 1417   TRIG 365 (H) 03/04/2021 1417   HDL 30 (L) 03/04/2021 1417   CHOLHDL 4.5 (H) 03/04/2021 1417   LDLCALC 49 03/04/2021 1417    Other Studies Reviewed Today:  Chest x-ray 09/02/2020: FINDINGS: Lungs clear. Heart size normal. No pneumothorax or pleural effusion. No acute or focal bony abnormality.   IMPRESSION: Negative chest.  Assessment and Plan:  1.  Patient presents for follow-up indicating to high heart rate alerts at rest by her apple watch, not atrial fibrillation  alarms however.  Both of these episodes by description could have been situational as reviewed above.  She does not describe any sense of palpitations at baseline and has been tolerating an exercise plan recently.  ECG today is normal.  Would recommend observation for now.  I have asked her to let us know if these alerts increase in frequency or if she gets in atrial fibrillation alert as further cardiac monitoring would be necessary.  2.  Type 2 diabetes mellitus, she is currently on insulin and Glucophage with follow-up by PCP.  She is not on statin therapy, but her LDL is only 49.  Medication Adjustments/Labs and Tests Ordered: Current medicines are reviewed at length with the patient today.  Concerns regarding medicines are outlined above.   Tests Ordered: No orders of the defined types were placed in this encounter.   Medication Changes: No orders of the defined types were placed in this encounter.   Disposition:  Follow up  6 months.  Signed, Satira Sark, MD, [facility] 04/09/2021 4:03 PM    [facility] [place], [place], [place] [address] Phone: [phone]; Fax: [phone]

## 2021-04-12 ENCOUNTER — Encounter: Payer: Self-pay | Admitting: Family Medicine

## 2021-04-13 ENCOUNTER — Other Ambulatory Visit (HOSPITAL_COMMUNITY): Payer: Self-pay

## 2021-04-13 MED ORDER — BUSPIRONE HCL 10 MG PO TABS
ORAL_TABLET | Freq: Three times a day (TID) | ORAL | 0 refills | Status: DC
  Filled 2021-04-13: qty 270, 90d supply, fill #0

## 2021-04-20 ENCOUNTER — Encounter: Payer: 59 | Admitting: Family Medicine

## 2021-05-04 ENCOUNTER — Other Ambulatory Visit (HOSPITAL_COMMUNITY): Payer: Self-pay

## 2021-05-04 DIAGNOSIS — H40053 Ocular hypertension, bilateral: Secondary | ICD-10-CM | POA: Diagnosis not present

## 2021-05-04 DIAGNOSIS — H40043 Steroid responder, bilateral: Secondary | ICD-10-CM | POA: Diagnosis not present

## 2021-05-04 LAB — HM DIABETES EYE EXAM

## 2021-05-04 MED ORDER — DORZOLAMIDE HCL-TIMOLOL MAL 2-0.5 % OP SOLN
OPHTHALMIC | 5 refills | Status: DC
  Filled 2021-05-04: qty 10, 50d supply, fill #0
  Filled 2021-06-05 – 2021-06-06 (×2): qty 10, 37d supply, fill #1
  Filled 2021-07-18: qty 10, 37d supply, fill #2
  Filled 2021-08-23: qty 10, 37d supply, fill #3
  Filled 2021-10-25: qty 10, 37d supply, fill #4
  Filled 2021-12-13: qty 10, 40d supply, fill #5

## 2021-05-04 MED ORDER — LATANOPROST 0.005 % OP SOLN
OPHTHALMIC | 5 refills | Status: DC
  Filled 2021-05-04: qty 2.5, 25d supply, fill #0
  Filled 2021-06-05: qty 2.5, 25d supply, fill #1
  Filled 2021-07-17: qty 2.5, 25d supply, fill #2

## 2021-05-10 IMAGING — DX DG CHEST 2V
2 series · 2 of 2 positions shown · non-contrast
Comparison: 04/09/2010

CLINICAL DATA: Chest pain

EXAM:
CHEST - 2 VIEW

[chest pa]
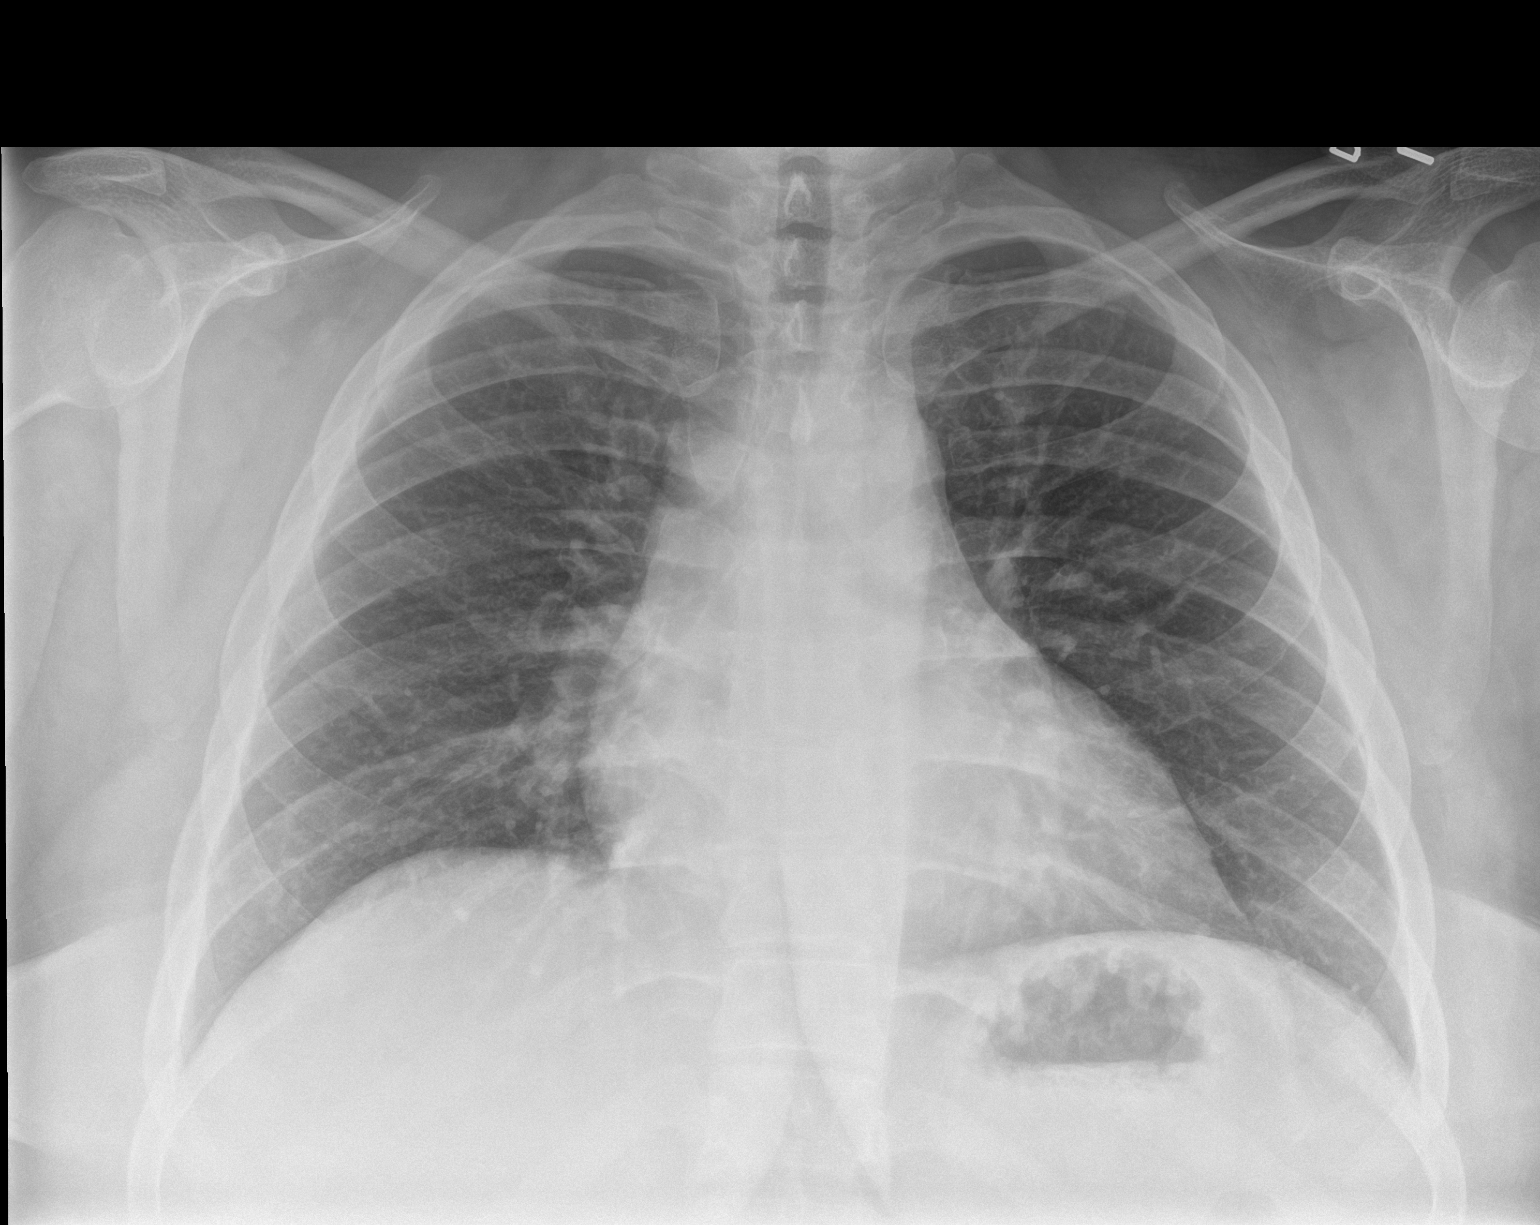

[chest lat]
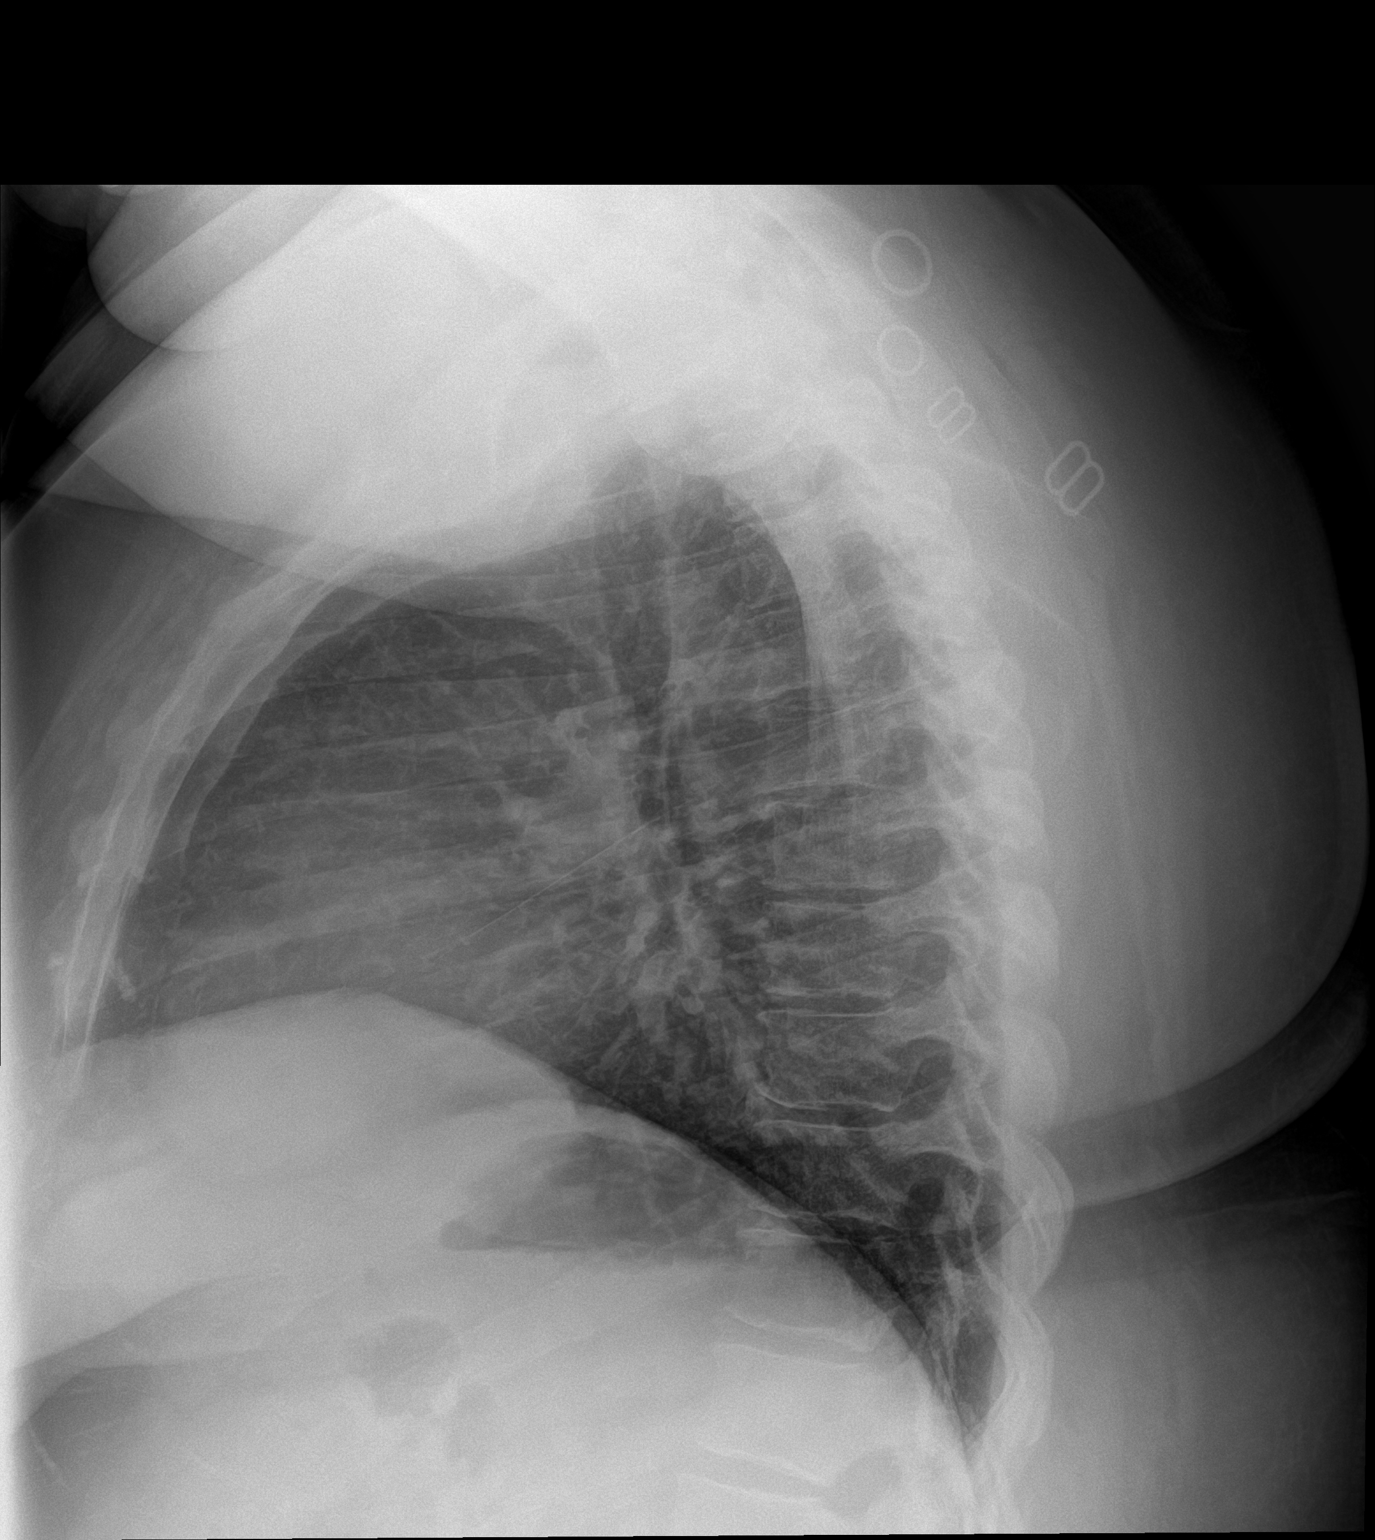

[2 of 2 positions shown; findings below may reference images not displayed]

FINDINGS: Heart and mediastinal contours are within normal limits. No focal
opacities or effusions. No acute bony abnormality.
IMPRESSION: No active cardiopulmonary disease.

## 2021-05-11 DIAGNOSIS — H40053 Ocular hypertension, bilateral: Secondary | ICD-10-CM | POA: Diagnosis not present

## 2021-05-11 DIAGNOSIS — H40043 Steroid responder, bilateral: Secondary | ICD-10-CM | POA: Diagnosis not present

## 2021-05-11 DIAGNOSIS — H43823 Vitreomacular adhesion, bilateral: Secondary | ICD-10-CM | POA: Diagnosis not present

## 2021-05-11 DIAGNOSIS — E113513 Type 2 diabetes mellitus with proliferative diabetic retinopathy with macular edema, bilateral: Secondary | ICD-10-CM | POA: Diagnosis not present

## 2021-05-11 IMAGING — US US ABDOMEN LIMITED
1 series · 14 of 25 positions shown · non-contrast
Comparison: None.

CLINICAL DATA: Upper abdominal pain

EXAM:
ULTRASOUND ABDOMEN LIMITED RIGHT UPPER QUADRANT

[Series 1: us abdomen limited · 0.22mm/px · 14 of 69 slices shown]
[im 1/69]
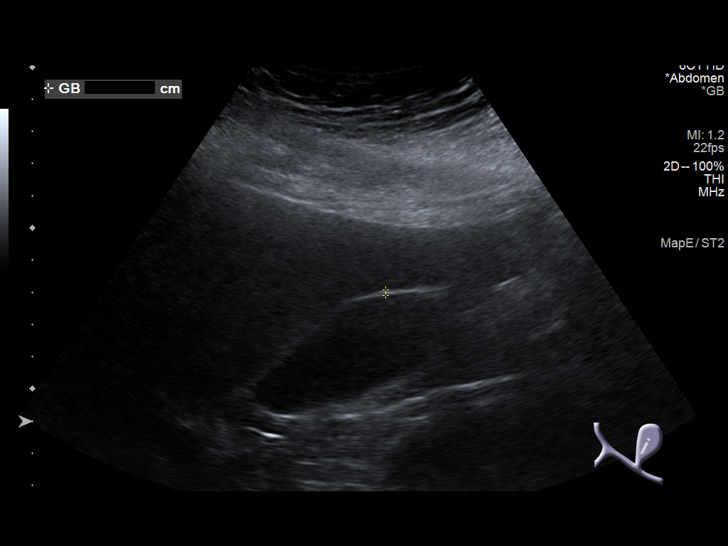
[im 6/69]
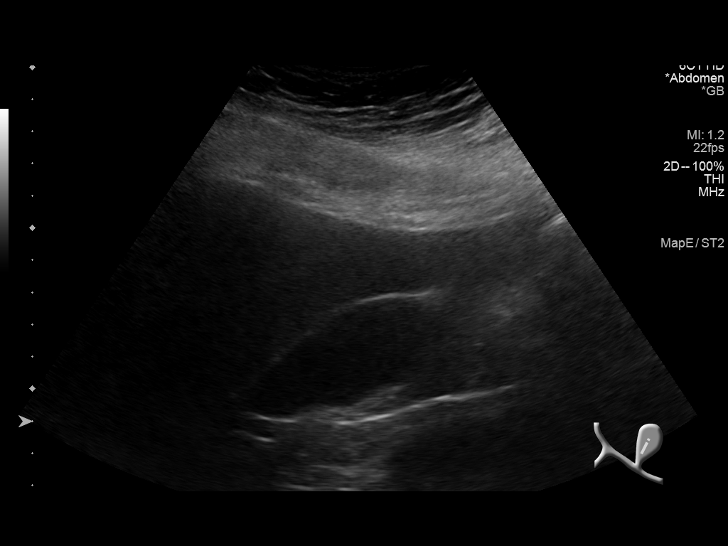
[im 12/69]
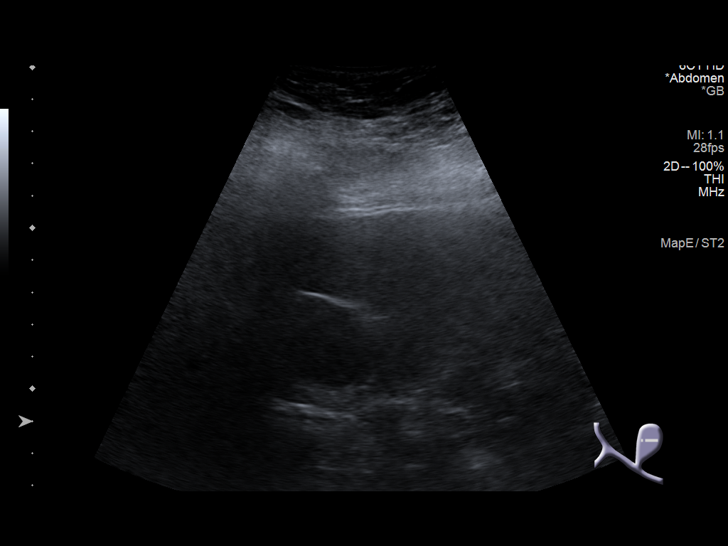
[im 18/69]
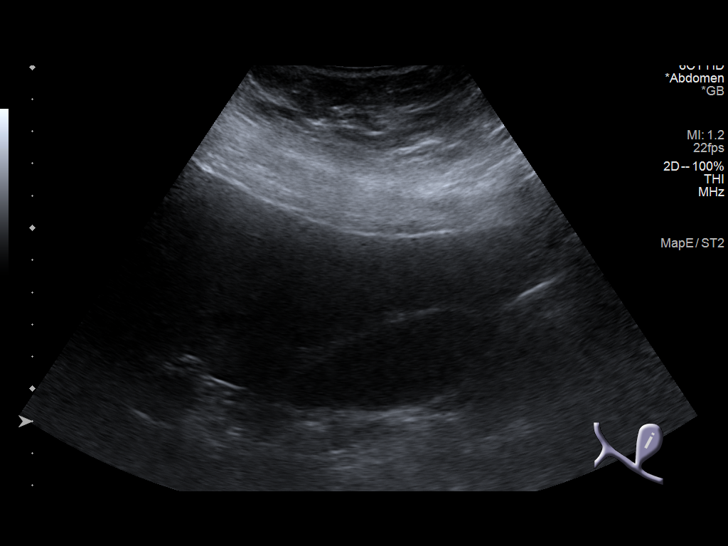
[im 23/69]
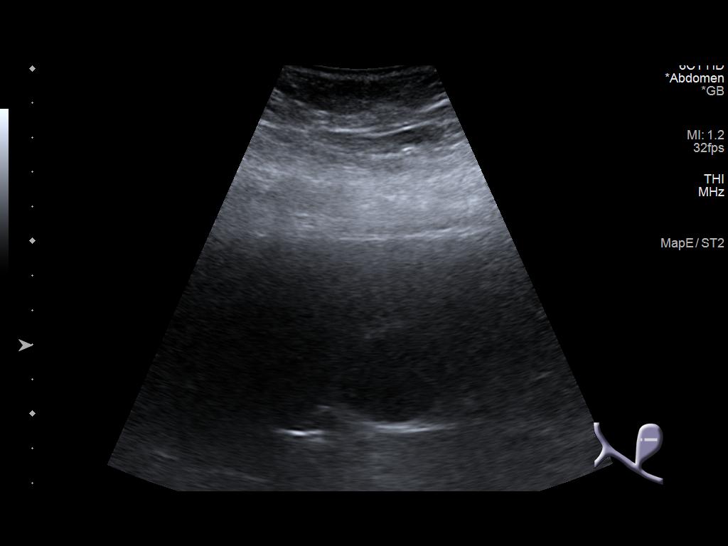
[im 26/69]
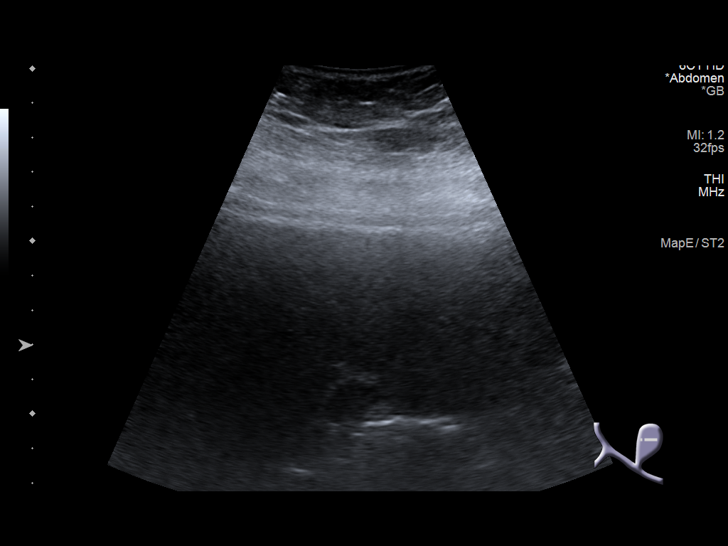
[im 32/69]
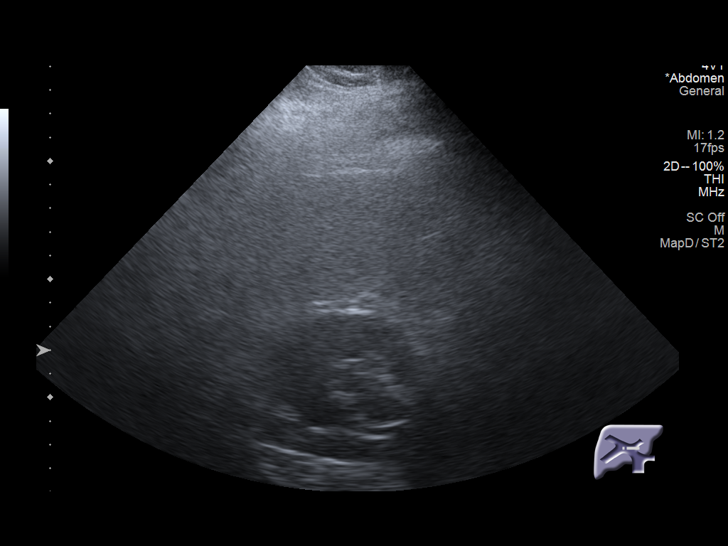
[im 37/69]
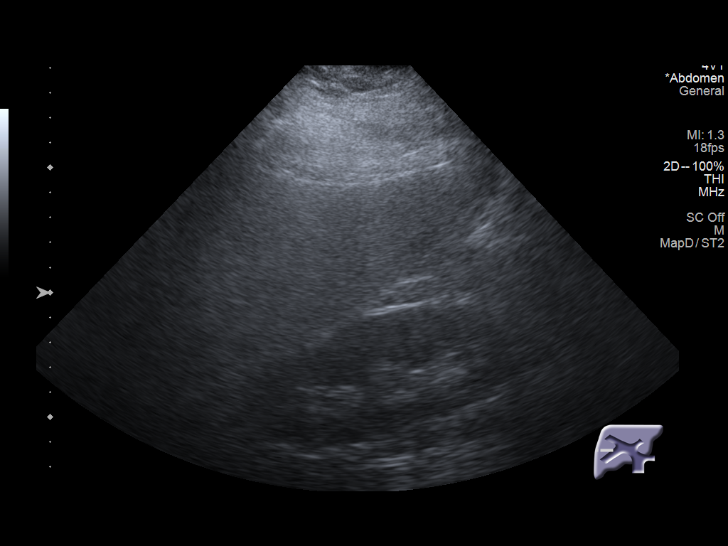
[im 43/69]
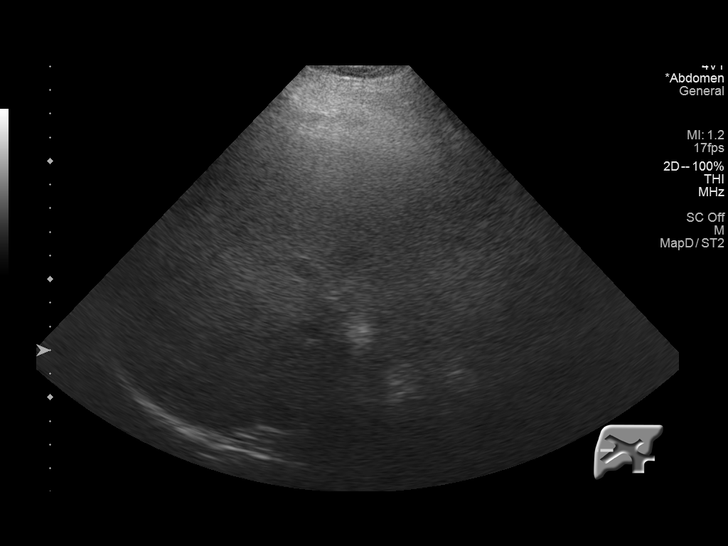
[im 46/69]
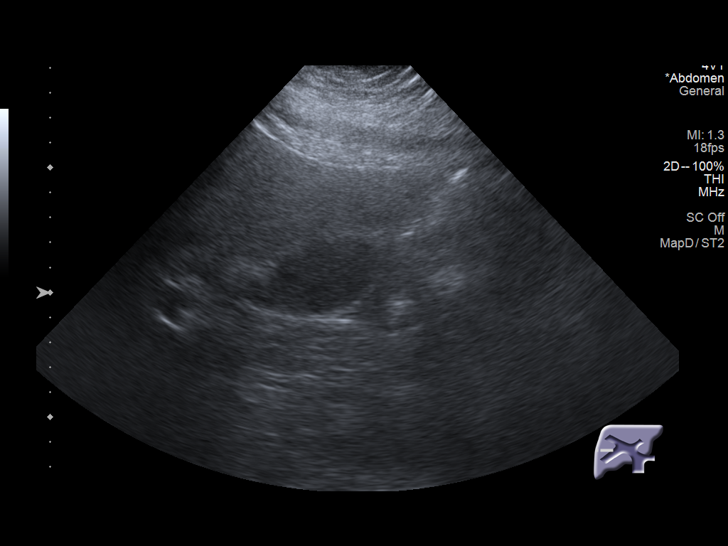
[im 52/69]
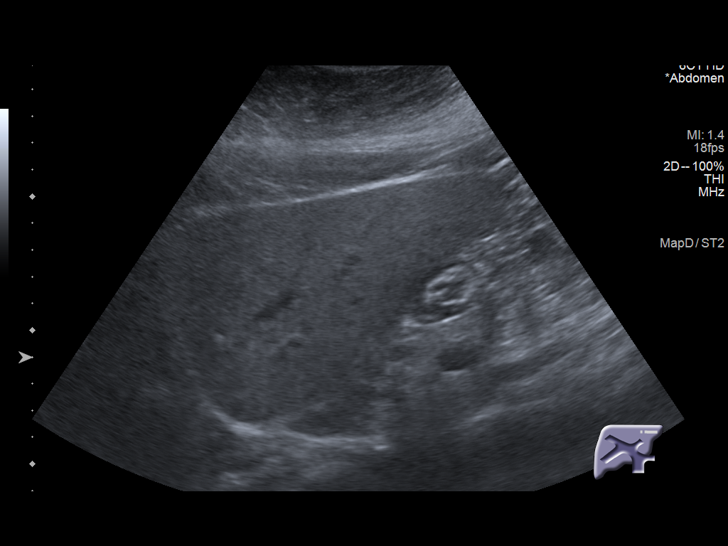
[im 57/69]
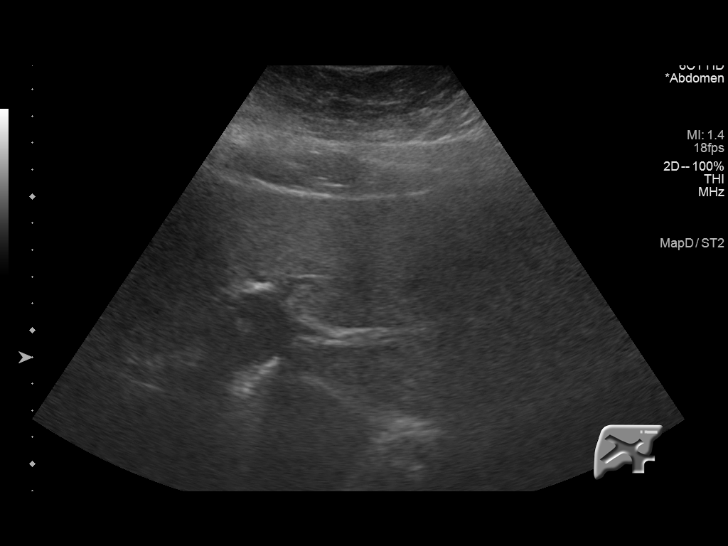
[im 63/69]
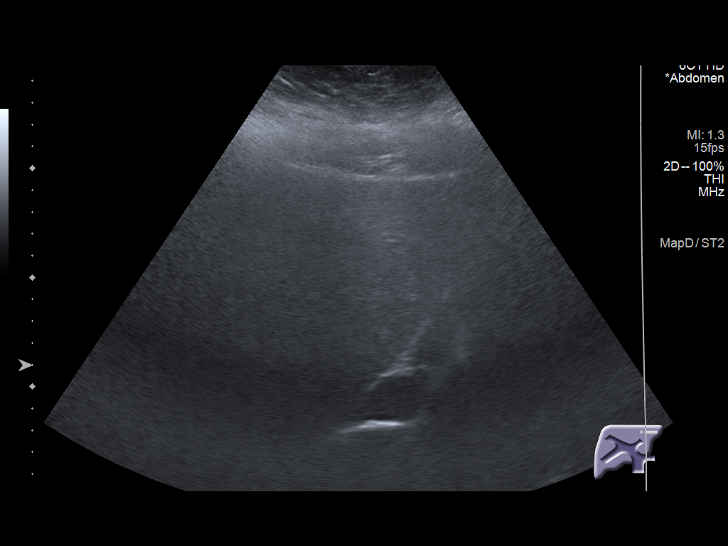
[im 69/69]
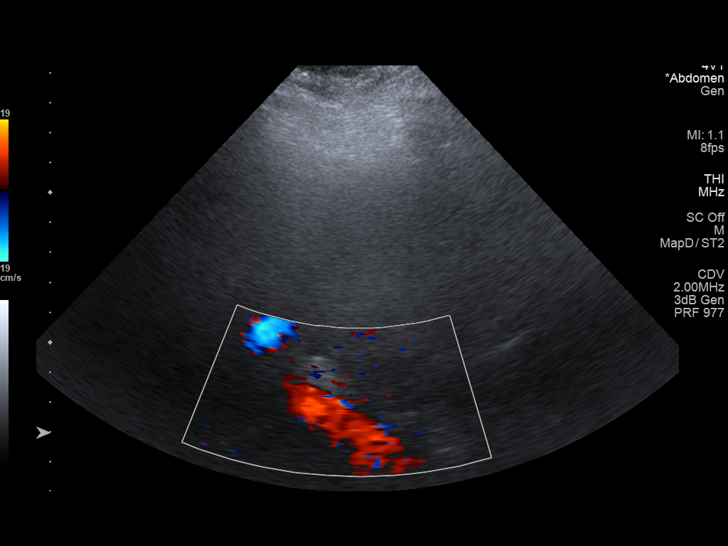

[14 of 25 positions shown; findings below may reference images not displayed]

FINDINGS: Gallbladder:

No gallstones or wall thickening visualized. There is no
pericholecystic fluid. No sonographic Murphy sign noted by
sonographer.

Common bile duct:

Diameter: 4 mm. No intrahepatic or extrahepatic biliary duct
dilatation. Note that portions of the common bile duct are obscured
by gas.

Liver:

No focal lesion identified. Liver echogenicity is increased
diffusely. Portal vein is patent on color Doppler imaging with
normal direction of blood flow towards the liver.

Other: None.
IMPRESSION: 1. Diffuse increase in liver echogenicity, a finding indicative of
hepatic steatosis. While no focal liver lesions are evident on this
study, it must be cautioned that the sensitivity of ultrasound for
detection of focal liver lesions is diminished significantly in this
circumstance.

2. Much of the common bile duct is obscured by gas. No biliary duct
dilatation evident.

3.  No demonstrable gallbladder pathology.

## 2021-05-15 ENCOUNTER — Other Ambulatory Visit: Payer: Self-pay | Admitting: Family Medicine

## 2021-05-15 DIAGNOSIS — E1159 Type 2 diabetes mellitus with other circulatory complications: Secondary | ICD-10-CM

## 2021-05-15 DIAGNOSIS — I152 Hypertension secondary to endocrine disorders: Secondary | ICD-10-CM

## 2021-05-18 ENCOUNTER — Other Ambulatory Visit (HOSPITAL_COMMUNITY): Payer: Self-pay

## 2021-05-18 MED ORDER — LISINOPRIL 10 MG PO TABS
ORAL_TABLET | Freq: Every day | ORAL | 0 refills | Status: DC
  Filled 2021-05-18: qty 90, 90d supply, fill #0

## 2021-05-21 ENCOUNTER — Other Ambulatory Visit: Payer: Self-pay

## 2021-05-21 ENCOUNTER — Other Ambulatory Visit (HOSPITAL_COMMUNITY): Payer: Self-pay

## 2021-05-21 MED ORDER — INSULIN REGULAR HUMAN 100 UNIT/ML IJ SOLN
INTRAMUSCULAR | 0 refills | Status: DC
  Filled 2021-05-21: qty 90, 90d supply, fill #0

## 2021-05-21 MED FILL — Glucose Blood Test Strip: 90 days supply | Qty: 300 | Fill #2 | Status: AC

## 2021-05-22 ENCOUNTER — Other Ambulatory Visit (HOSPITAL_COMMUNITY): Payer: Self-pay

## 2021-05-23 ENCOUNTER — Other Ambulatory Visit (HOSPITAL_COMMUNITY): Payer: Self-pay

## 2021-05-25 ENCOUNTER — Other Ambulatory Visit (HOSPITAL_COMMUNITY): Payer: Self-pay

## 2021-05-25 MED ORDER — INSULIN NPH (HUMAN) (ISOPHANE) 100 UNIT/ML ~~LOC~~ SUSP
SUBCUTANEOUS | 0 refills | Status: DC
  Filled 2021-05-25: qty 20, 60d supply, fill #0

## 2021-05-26 ENCOUNTER — Other Ambulatory Visit (HOSPITAL_COMMUNITY): Payer: Self-pay

## 2021-05-29 ENCOUNTER — Other Ambulatory Visit (HOSPITAL_COMMUNITY): Payer: Self-pay

## 2021-06-02 ENCOUNTER — Encounter: Payer: Self-pay | Admitting: Family Medicine

## 2021-06-02 ENCOUNTER — Ambulatory Visit (INDEPENDENT_AMBULATORY_CARE_PROVIDER_SITE_OTHER): Payer: 59 | Admitting: Family Medicine

## 2021-06-02 ENCOUNTER — Other Ambulatory Visit (HOSPITAL_COMMUNITY): Payer: Self-pay

## 2021-06-02 VITALS — BP 160/97 | HR 115 | Temp 98.2°F | Ht 65.0 in | Wt 325.8 lb

## 2021-06-02 DIAGNOSIS — E113513 Type 2 diabetes mellitus with proliferative diabetic retinopathy with macular edema, bilateral: Secondary | ICD-10-CM

## 2021-06-02 DIAGNOSIS — I152 Hypertension secondary to endocrine disorders: Secondary | ICD-10-CM | POA: Diagnosis not present

## 2021-06-02 DIAGNOSIS — Z0001 Encounter for general adult medical examination with abnormal findings: Secondary | ICD-10-CM

## 2021-06-02 DIAGNOSIS — Z794 Long term (current) use of insulin: Secondary | ICD-10-CM

## 2021-06-02 DIAGNOSIS — N926 Irregular menstruation, unspecified: Secondary | ICD-10-CM | POA: Diagnosis not present

## 2021-06-02 DIAGNOSIS — E1159 Type 2 diabetes mellitus with other circulatory complications: Secondary | ICD-10-CM | POA: Diagnosis not present

## 2021-06-02 DIAGNOSIS — Z0000 Encounter for general adult medical examination without abnormal findings: Secondary | ICD-10-CM

## 2021-06-02 LAB — BAYER DCA HB A1C WAIVED: HB A1C (BAYER DCA - WAIVED): 9.2 % — ABNORMAL HIGH (ref 4.8–5.6)

## 2021-06-02 MED ORDER — TIRZEPATIDE 5 MG/0.5ML ~~LOC~~ SOAJ
5.0000 mg | SUBCUTANEOUS | 0 refills | Status: DC
  Filled 2021-06-02: qty 6, 84d supply, fill #0

## 2021-06-02 MED ORDER — TIRZEPATIDE 7.5 MG/0.5ML ~~LOC~~ SOAJ
7.5000 mg | SUBCUTANEOUS | 0 refills | Status: DC
  Filled 2021-06-02 – 2021-09-01 (×2): qty 6, 84d supply, fill #0

## 2021-06-02 MED ORDER — TIRZEPATIDE 10 MG/0.5ML ~~LOC~~ SOAJ
10.0000 mg | SUBCUTANEOUS | 0 refills | Status: DC
  Filled 2021-06-02 – 2021-11-17 (×2): qty 6, 84d supply, fill #0

## 2021-06-02 NOTE — Patient Instructions (Addendum)
[email or web address]  You had labs performed today.  You will be contacted with the results of the labs once they are available, usually in the next 3 business days for routine lab work.  If you have an active my chart account, they will be released to your MyChart.  If you prefer to have these labs released to you via telephone, please let us know.    Preventive Care 69-41 Years Old, Female Preventive care refers to lifestyle choices and visits with your health care provider that can promote health and wellness. Preventive care visits are also called wellness exams. What can I expect for my preventive care visit? Counseling Your health care provider may ask you questions about your: Medical history, including: Past medical problems. Family medical history. Pregnancy history. Current health, including: Menstrual cycle. Method of birth control. Emotional well-being. Home life and relationship well-being. Sexual activity and sexual health. Lifestyle, including: Alcohol, nicotine or tobacco, and drug use. Access to firearms. Diet, exercise, and sleep habits. Work and work Statistician. Sunscreen use. Safety issues such as seatbelt and bike helmet use. Physical exam Your health care provider will check your: Height and weight. These may be used to calculate your BMI (body mass index). BMI is a measurement that tells if you are at a healthy weight. Waist circumference. This measures the distance around your waistline. This measurement also tells if you are at a healthy weight and may help predict your risk of certain diseases, such as type 2 diabetes and high blood pressure. Heart rate and blood pressure. Body temperature. Skin for abnormal spots. What immunizations do I need? Vaccines are usually given at various ages, according to a schedule. Your health care provider will recommend vaccines for you based on your age, medical history, and lifestyle or  other factors, such as travel or where you work. What tests do I need? Screening Your health care provider may recommend screening tests for certain conditions. This may include: Lipid and cholesterol levels. Diabetes screening. This is done by checking your blood sugar (glucose) after you have not eaten for a while (fasting). Pelvic exam and Pap test. Hepatitis B test. Hepatitis C test. HIV (human immunodeficiency virus) test. STI (sexually transmitted infection) testing, if you are at risk. Lung cancer screening. Colorectal cancer screening. Mammogram. Talk with your health care provider about when you should start having regular mammograms. This may depend on whether you have a family history of breast cancer. BRCA-related cancer screening. This may be done if you have a family history of breast, ovarian, tubal, or peritoneal cancers. Bone density scan. This is done to screen for osteoporosis. Talk with your health care provider about your test results, treatment options, and if necessary, the need for more tests. Follow these instructions at home: Eating and drinking  Eat a diet that includes fresh fruits and vegetables, whole grains, lean protein, and low-fat dairy products. Take vitamin and mineral supplements as recommended by your health care provider. Do not drink alcohol if: Your health care provider tells you not to drink. You are pregnant, may be pregnant, or are planning to become pregnant. If you drink alcohol: Limit how much you have to 0-1 drink a day. Know how much alcohol is in your drink. In the [place] one drink equals one 12 oz bottle of beer (355 mL), one 5 oz glass of wine (148 mL), or one 1 oz glass of hard liquor (44 mL). Lifestyle Brush your teeth every morning and night with fluoride toothpaste. Floss one  time each day. Exercise for at least 30 minutes 5 or more days each week. Do not use any products that contain nicotine or tobacco. These products include  cigarettes, chewing tobacco, and vaping devices, such as e-cigarettes. If you need help quitting, ask your health care provider. Do not use drugs. If you are sexually active, practice safe sex. Use a condom or other form of protection to prevent STIs. If you do not wish to become pregnant, use a form of birth control. If you plan to become pregnant, see your health care provider for a prepregnancy visit. Take aspirin only as told by your health care provider. Make sure that you understand how much to take and what form to take. Work with your health care provider to find out whether it is safe and beneficial for you to take aspirin daily. Find healthy ways to manage stress, such as: Meditation, yoga, or listening to music. Journaling. Talking to a trusted person. Spending time with friends and family. Minimize exposure to UV radiation to reduce your risk of skin cancer. Safety Always wear your seat belt while driving or riding in a vehicle. Do not drive: If you have been drinking alcohol. Do not ride with someone who has been drinking. When you are tired or distracted. While texting. If you have been using any mind-altering substances or drugs. Wear a helmet and other protective equipment during sports activities. If you have firearms in your house, make sure you follow all gun safety procedures. Seek help if you have been physically or sexually abused. What's next? Visit your health care provider once a year for an annual wellness visit. Ask your health care provider how often you should have your eyes and teeth checked. Stay up to date on all vaccines. This information is not intended to replace advice given to you by your health care provider. Make sure you discuss any questions you have with your health care provider. Document Revised: 09/17/2020 Document Reviewed: 09/17/2020 Elsevier Patient Education  [place].

## 2021-06-02 NOTE — Progress Notes (Signed)
Amy Ball is a 41 y.o. female presents to office today for annual physical exam examination.    Concerns today include: 1.  Type 2 diabetes Patient reports that she is compliant with all of her insulins.  She has not followed with endocrinology because she has gained weight and this bothers her.  She has thought a lot about our last conversation she does want to pursue a GIP.  She has been previously treated with Victoza and was successful with that.  She recognizes now that she really needs to get her own health in order to have a healthy pregnancy.  She is going to delay pursuing any type of pregnancy until she has been able to lose some weight and get her sugar and blood pressure under better control.  She is very interested in [place] and would like to start that if able.  No history of pancreatitis, multiple endocrine type II neoplasia or medullary thyroid cancers personally nor in the family.  She does report that her periods have been a little lighter over the last couple of months.  She just wanted to make mention of that.  Blood pressures at home are running 120s over 80s to 90s.  Occupation: Education officer, museum, Marital status: Married, Substance use: None Diet: She tries to limit portion sizes and carbs, Exercise: Actively working out regularly now Last eye exam: Up-to-date.  Being treated with injection therapy Last dental exam: Up-to-date Last colonoscopy: N/A Last mammogram: N/A Last pap smear: Sees OB/GYN Refills needed today: None Immunizations needed: Immunization History  Administered Date(s) Administered   Influenza Inj Mdck Quad With Preservative 01/19/2018   Influenza Split 01/21/2016, 01/13/2018, 02/05/2019   Influenza-Unspecified 02/16/2006, 02/06/2014, 01/22/2017, 01/19/2018, 02/25/2020, 01/13/2021   PFIZER(Purple Top)SARS-COV-2 Vaccination 06/10/2019   PPD Test 02/16/2006   Pneumococcal Polysaccharide-23 04/13/2018     Past Medical History:  Diagnosis  Date   Environmental allergies    GAD (generalized anxiety disorder) 12/05/2018   GERD (gastroesophageal reflux disease)    Hypertension    Type 2 diabetes mellitus ([place])    Social History   Socioeconomic History   Marital status: Married    Spouse name: Retail banker   Number of children: Not on file   Years of education: 18   Highest education level: Master's degree (e.g., MA, MS, MEng, MEd, MSW, MBA)  Occupational History   Occupation: third grade teacher  Tobacco Use   Smoking status: Never   Smokeless tobacco: Never  Vaping Use   Vaping Use: Never used  Substance and Sexual Activity   Alcohol use: Yes    Comment: occasionally   Drug use: No   Sexual activity: Yes  Other Topics Concern   Not on file  Social History Narrative   Engaged to be married December 8 to [place]   Social Determinants of Health   Financial Resource Strain: Not on [employer] Insecurity: Not on file  Transportation Needs: Not on file  Physical Activity: Not on file  Stress: Not on file  Social Connections: Not on file  Intimate Partner Violence: Not on file   Past Surgical History:  Procedure Laterality Date   FRACTURE SURGERY  2007   right ankle   Family History  Problem Relation Age of Onset   Birth defects Mother        one hand small   Arthritis Father    Diabetes Father    Heart disease Father 68       heart attack  Psoriasis Father    Psoriasis Brother    Stroke Paternal Grandmother        age 20   Cancer Paternal Grandfather     Current Outpatient Medications:    Ascorbic Acid (VITAMIN C) 1000 MG tablet, Take 1,000 mg by mouth daily., Disp: , Rfl:    busPIRone (BUSPAR) 10 MG tablet, Take 1 tablet by mouth 3 (three) times daily., Disp: 270 tablet, Rfl: 0   cetirizine (ZYRTEC) 10 MG tablet, Take 10 mg by mouth daily., Disp: , Rfl:    Continuous Blood Gluc Receiver (DEXCOM G6 RECEIVER) DEVI, Use as directed, Disp: 1 each, Rfl: 0   Continuous Blood Gluc Sensor (DEXCOM G6  SENSOR) MISC, Change every 10 days, Disp: 9 each, Rfl: 3   Continuous Blood Gluc Transmit (DEXCOM G6 TRANSMITTER) MISC, Use as directed every 90 days, Disp: 1 each, Rfl: 3   dorzolamide-timolol (COSOPT) 22.3-6.8 MG/ML ophthalmic solution, Instill 1 drop in both eyes twice a day, Disp: 10 mL, Rfl: 5   EPINEPHrine 0.3 mg/0.3 mL IJ SOAJ injection, Inject 0.3 mLs (0.3 mg total) into the muscle as needed for anaphylaxis., Disp: 1 each, Rfl: 0   fluticasone (FLONASE) 50 MCG/ACT nasal spray, PLACE 1 SPRAY INTO BOTH NOSTRILS DAILY., Disp: 48 g, Rfl: 1   glucose blood test strip, USE TO CHECK BLOOD SUGAR 3 TIMES DAILY (Patient taking differently: USE TO CHECK BLOOD SUGAR 3 TIMES DAILY), Disp: 300 strip, Rfl: 2   HUMULIN N 100 UNIT/ML injection, Inject 0.1 mLs (10 Units total) into the skin at bedtime. (Patient taking differently: Inject 20 Units into the skin at bedtime.), Disp: 30 mL, Rfl: 3   hydrochlorothiazide (HYDRODIURIL) 12.5 MG tablet, Take 1 tablet by mouth daily., Disp: 90 tablet, Rfl: 0   hydrocortisone 2.5 % ointment, APPLY TO THE AFFECTED AREA(S) OF SKIN 2 TIMES DAILY, Disp: 454 g, Rfl: 3   insulin NPH Human (HUMULIN N) 100 UNIT/ML injection, Inject 20 units into the skin at bedtime., Disp: 30 mL, Rfl: 0   insulin regular (HUMULIN R) 100 units/mL injection, 3 times a day (just before each meal) 35-25-35 units, and 50-units syringes 4/day (Patient taking differently: Inject 30 Units into the skin 3 (three) times daily before meals.), Disp: 100 mL, Rfl: 3   insulin regular (HUMULIN R) 100 units/mL injection, Inject 30 units under the skin at meals 3 times a day, Disp: 90 mL, Rfl: 0   Insulin Syringe-Needle U-100 (TECHLITE INSULIN SYRINGE) 31G X 15/64" 0.3 ML MISC, Use as directed 4 times a day to inject insulin, Disp: 400 each, Rfl: 3   Insulin Syringe-Needle U-100 31G X 5/16" 0.3 ML MISC, USE TO ADMINISTER INSULIN 4 TIMES DAILY AS DIRECTED, Disp: 400 each, Rfl: 3   Lancets (FREESTYLE) lancets, 1  each 3 (three) times daily., Disp: , Rfl:    latanoprost (XALATAN) 0.005 % ophthalmic solution, Instill 1 drop in both eyes at bedtime, Disp: 1 mL, Rfl: 5   lisinopril (ZESTRIL) 10 MG tablet, TAKE 1 TABLET BY MOUTH ONCE A DAY, Disp: 90 tablet, Rfl: 0   metFORMIN (GLUCOPHAGE) 1000 MG tablet, Take 1 tablet by mouth 2  times daily with a meal., Disp: 180 tablet, Rfl: 0   NOVOFINE 32G X 6 MM MISC, , Disp: , Rfl:    pantoprazole (PROTONIX) 20 MG tablet, Take 1 tablet (20 mg total) by mouth 2 (two) times daily before a meal., Disp: 180 tablet, Rfl: 2   Prenatal Vit-Fe Fumarate-FA (PRENATAL VITAMIN PO), Take 1  tablet by mouth daily., Disp: , Rfl:    triamcinolone (KENALOG) 0.1 %, APPLY TO THE AFFECTED AREA(S) SPARINGLY 2 TIMES DAILY, TAPER USE AS ABLE, Disp: 454 g, Rfl: 3   TRUEPLUS INSULIN SYRINGE 30G X 5/16" 1 ML MISC, USE TO INJECT INSULIN 4 TIMES DAILY, Disp: 300 each, Rfl: 3   betamethasone dipropionate 0.05 % lotion, APPLY TO THE SCALP ONCE A DAY TAPER USE AS ABLE (Patient not taking: Reported on 06/02/2021), Disp: 120 mL, Rfl: 3   calcipotriene (DOVONOX) 0.005 % ointment, APPLY 1 APPLICATION ON THE SKIN TWICE A DAY (Patient not taking: Reported on 06/02/2021), Disp: 60 g, Rfl: 3  Allergies  Allergen Reactions   Amoxicillin-Pot Clavulanate Anaphylaxis   Covid-19 Mrna Vacc (Moderna) Anaphylaxis   Macrobid [Nitrofurantoin Macrocrystal] Swelling and Rash   Basaglar Kwikpen [Insulin Glargine] Swelling   Penicillins    Avelox [Moxifloxacin Hcl In Nacl] Rash     ROS: Review of Systems A comprehensive review of systems was negative except for: Eyes: positive for contacts/glasses Respiratory: positive for dyspnea on exertion Genitourinary: positive for irregular menstrual flow Musculoskeletal: positive for arthralgias and back pain Behavioral/Psych: positive for anxiety and depression    Physical exam BP (!) 160/97    Pulse (!) 115    Temp 98.2 F (36.8 C)    Ht _0  (1.651 m)    Wt (!) 325 lb  12.8 oz (147.8 kg)    SpO2 97%    BMI 54.22 kg/m  General appearance: alert, cooperative, appears stated age, no distress, and morbidly obese Head: Normocephalic, without obvious abnormality, atraumatic Eyes: negative findings: lids and lashes normal, conjunctivae and sclerae normal, corneas clear, and pupils equal, round, reactive to light and accomodation Ears: normal TM's and external ear canals both ears Nose: Nares normal. Septum midline. Mucosa normal. No drainage or sinus tenderness. Throat: lips, mucosa, and tongue normal; teeth and gums normal Neck: no adenopathy, supple, symmetrical, trachea midline, and thyroid not enlarged, symmetric, no tenderness/mass/nodules Back:  Mild buffalo hump noted Lungs: clear to auscultation bilaterally Heart: regular rate and rhythm, S1, S2 normal, no murmur, click, rub or gallop Abdomen: soft, non-tender; bowel sounds normal; no masses,  no organomegaly; exam limited by obesity Extremities: extremities normal, atraumatic, no cyanosis. Pedal edema PRESENT Pulses: 2+ and symmetric Skin:  face is flushed Lymph nodes: Cervical, supraclavicular, and axillary nodes normal. Neurologic: Grossly normal Psych: Very pleasant, interactive.   Assessment/ Plan: Sonny Masters here for annual physical exam.   Annual physical exam  Type 2 diabetes mellitus with both eyes affected by proliferative retinopathy and macular edema, with long-term current use of insulin ([place]) - Plan: Bayer DCA Hb A1c Waived, tirzepatide (MOUNJARO) 5 MG/0.5ML Pen, tirzepatide (MOUNJARO) 7.5 MG/0.5ML Pen, tirzepatide (MOUNJARO) 10 MG/0.5ML Pen, CMP14+EGFR  Morbid obesity ([place]) - Plan: tirzepatide (MOUNJARO) 5 MG/0.5ML Pen, tirzepatide (MOUNJARO) 7.5 MG/0.5ML Pen, tirzepatide (MOUNJARO) 10 MG/0.5ML Pen, CMP14+EGFR  Hypertension associated with diabetes ([place])  Irregular menses - Plan: TSH, T4, Free, hCG, serum, qualitative, CMP14+EGFR  We will try her on Mounjaro.  She is to the  point where she continues to gain weight and really has not had any success with fertility.  I think that getting some of the weight off and getting her physical health in a better shape will be to her benefit for pursuing pregnancy.  We talked about progesterone levels are typically increased with these classes of medications.  If she becomes pregnant on the medication she is immediately to discontinue, contact both  me and her OB/GYN as she will need to be bridged with progesterone since there is a large drop in progesterone with discontinuation of the GIP.  For now, I would recommend barrier method of contraception.  A1c today was 9.2.  Blood pressure is not at goal but her home blood pressures seem to be normal.  I did not adjust her medications today.  She will have her nurse at work check her blood pressure.  If remains elevated or she starts having any concerning symptoms or signs we will escalate the lisinopril.  For her irregular menses, this is likely related to obesity but will check TSH, free T4 and hCG prior to initiation of the GIP to ensure that there are no barriers to use.  Would like to see her back in about a month for weight recheck, tolerance of medications.  I have gone ahead and postdated 5, 7.5 and 10 mg Mounjaro  Counseled on healthy lifestyle choices, including diet (rich in fruits, vegetables and lean meats and low in salt and simple carbohydrates) and exercise (at least 30 minutes of moderate physical activity daily).  Patient to follow up in 1 year for annual exam or sooner if needed.  Zareen Jamison M. Lajuana Ripple, DO

## 2021-06-03 ENCOUNTER — Encounter: Payer: Self-pay | Admitting: Family Medicine

## 2021-06-03 ENCOUNTER — Other Ambulatory Visit (HOSPITAL_COMMUNITY): Payer: Self-pay

## 2021-06-03 LAB — CMP14+EGFR
ALT: 27 IU/L (ref 0–32)
AST: 17 IU/L (ref 0–40)
Albumin/Globulin Ratio: 1.4 (ref 1.2–2.2)
Albumin: 4.2 g/dL (ref 3.8–4.8)
Alkaline Phosphatase: 73 IU/L (ref 44–121)
BUN/Creatinine Ratio: 19 (ref 9–23)
BUN: 16 mg/dL (ref 6–24)
Bilirubin Total: <0.2 mg/dL (ref 0.0–1.2)
CO2: 23 mmol/L (ref 20–29)
Calcium: 9.9 mg/dL (ref 8.7–10.2)
Chloride: 95 mmol/L — ABNORMAL LOW (ref 96–106)
Creatinine, Ser: 0.83 mg/dL (ref 0.57–1.00)
Globulin, Total: 3 g/dL (ref 1.5–4.5)
Glucose: 273 mg/dL — ABNORMAL HIGH (ref 70–99)
Potassium: 4.4 mmol/L (ref 3.5–5.2)
Sodium: 134 mmol/L (ref 134–144)
Total Protein: 7.2 g/dL (ref 6.0–8.5)
eGFR: 91 mL/min/{1.73_m2} (ref >59)

## 2021-06-03 LAB — HCG, SERUM, QUALITATIVE: hCG,Beta Subunit,Qual,Serum: NEGATIVE m[IU]/mL (ref <6)

## 2021-06-03 LAB — T4, FREE: Free T4: 1.5 ng/dL (ref 0.82–1.77)

## 2021-06-03 LAB — TSH: TSH: 2.84 u[IU]/mL (ref 0.450–4.500)

## 2021-06-06 ENCOUNTER — Other Ambulatory Visit (HOSPITAL_COMMUNITY): Payer: Self-pay

## 2021-06-06 ENCOUNTER — Other Ambulatory Visit: Payer: Self-pay | Admitting: Family Medicine

## 2021-06-06 MED ORDER — METFORMIN HCL 1000 MG PO TABS
1000.0000 mg | ORAL_TABLET | Freq: Two times a day (BID) | ORAL | 0 refills | Status: DC
  Filled 2021-06-06: qty 180, 90d supply, fill #0

## 2021-06-07 ENCOUNTER — Other Ambulatory Visit (HOSPITAL_COMMUNITY): Payer: Self-pay

## 2021-06-08 ENCOUNTER — Other Ambulatory Visit (HOSPITAL_COMMUNITY): Payer: Self-pay

## 2021-06-13 ENCOUNTER — Other Ambulatory Visit (HOSPITAL_COMMUNITY): Payer: Self-pay

## 2021-06-16 ENCOUNTER — Other Ambulatory Visit (HOSPITAL_COMMUNITY): Payer: Self-pay

## 2021-06-16 DIAGNOSIS — E113513 Type 2 diabetes mellitus with proliferative diabetic retinopathy with macular edema, bilateral: Secondary | ICD-10-CM | POA: Diagnosis not present

## 2021-06-16 DIAGNOSIS — H35373 Puckering of macula, bilateral: Secondary | ICD-10-CM | POA: Diagnosis not present

## 2021-06-16 DIAGNOSIS — H35033 Hypertensive retinopathy, bilateral: Secondary | ICD-10-CM | POA: Diagnosis not present

## 2021-06-16 DIAGNOSIS — H43823 Vitreomacular adhesion, bilateral: Secondary | ICD-10-CM | POA: Diagnosis not present

## 2021-06-17 ENCOUNTER — Other Ambulatory Visit (HOSPITAL_COMMUNITY): Payer: Self-pay

## 2021-06-25 ENCOUNTER — Encounter: Payer: Self-pay | Admitting: Family Medicine

## 2021-06-28 ENCOUNTER — Other Ambulatory Visit: Payer: Self-pay | Admitting: Family Medicine

## 2021-06-28 ENCOUNTER — Telehealth: Payer: 59 | Admitting: Nurse Practitioner

## 2021-06-28 DIAGNOSIS — J309 Allergic rhinitis, unspecified: Secondary | ICD-10-CM

## 2021-06-28 DIAGNOSIS — J019 Acute sinusitis, unspecified: Secondary | ICD-10-CM

## 2021-06-28 DIAGNOSIS — I10 Essential (primary) hypertension: Secondary | ICD-10-CM

## 2021-06-28 DIAGNOSIS — B9789 Other viral agents as the cause of diseases classified elsewhere: Secondary | ICD-10-CM

## 2021-06-28 DIAGNOSIS — R609 Edema, unspecified: Secondary | ICD-10-CM

## 2021-06-28 MED ORDER — FLUTICASONE PROPIONATE 50 MCG/ACT NA SUSP
2.0000 | Freq: Every day | NASAL | 0 refills | Status: DC
Start: 2021-06-28 — End: 2021-07-14

## 2021-06-28 MED ORDER — PSEUDOEPH-BROMPHEN-DM 30-2-10 MG/5ML PO SYRP: 5.0000 mL | ORAL_SOLUTION | Freq: Four times a day (QID) | ORAL | 0 refills | Status: AC | PRN

## 2021-06-28 MED ORDER — MONTELUKAST SODIUM 10 MG PO TABS: 10.0000 mg | ORAL_TABLET | Freq: Every day | ORAL | 0 refills | Status: DC

## 2021-06-28 NOTE — Progress Notes (Signed)
?Virtual Visit Consent  ? ?Amy Ball, you are scheduled for a virtual visit with a [facility] provider today.   ?  ?Just as with appointments in the office, your consent must be obtained to participate.  Your consent will be active for this visit and any virtual visit you may have with one of our providers in the next 365 days.   ?  ?If you have a MyChart account, a copy of this consent can be sent to you electronically.  All virtual visits are billed to your insurance company just like a traditional visit in the office.   ? ?As this is a virtual visit, video technology does not allow for your provider to perform a traditional examination.  This may limit your provider's ability to fully assess your condition.  If your provider identifies any concerns that need to be evaluated in person or the need to arrange testing (such as labs, EKG, etc.), we will make arrangements to do so.   ?  ?Although advances in technology are sophisticated, we cannot ensure that it will always work on either your end or our end.  If the connection with a video visit is poor, the visit may have to be switched to a telephone visit.  With either a video or telephone visit, we are not always able to ensure that we have a secure connection.    ? ?I need to obtain your verbal consent now.   Are you willing to proceed with your visit today?  ?  ?Amy Ball has provided verbal consent on 06/28/2021 for a virtual visit (video or telephone). ?  ?Amy Men, NP  ? ?Date: 06/28/2021 10:54 AM ? ? ?Virtual Visit via Video Note  ? ?ITish Ball, [name]  ([id number], Feb 18, 1981) on 06/28/21 at 11:00 AM EDT by a video-enabled telemedicine application and verified that I am speaking with the correct person using two identifiers. ? ?Location: ?Patient: Virtual Visit Location Patient: Home ?Provider: Virtual Visit Location Provider: Home ?  ?I discussed the limitations of evaluation and  management by telemedicine and the availability of in person appointments. The patient expressed understanding and agreed to proceed.   ? ?History of Present Illness: ?Amy Ball is a 41 y.o. who identifies as a female who was assigned female at birth, and is being seen today for sinus symptoms. Symptoms present for the past 2 days. She complains of ears feeling clogged, scratchy throat, nasal congestion and runny nose. Denies fever, HA, ear pain, or GI symptoms. Has a history of seasonal allergies. Has been taking Zyrtec and Fluticasone for symptoms.  ? ?HPI: HPI  ?Problems:  ?Patient Active Problem List  ? Diagnosis Date Noted  ? 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 GAD (generalized anxiety disorder) 12/05/2018  ? Gastroesophageal reflux disease without esophagitis 02/20/2018  ? Intertriginous candidiasis 02/20/2018  ? Attempting to conceive 02/20/2018  ? Obesity, Class III, BMI 40-49.9 (morbid obesity) ([place]) 02/15/2017  ? Controlled type 2 diabetes mellitus without complication, without long-term current use of insulin ([place]) 02/15/2017  ? Hypertension associated with diabetes ([place]) 02/15/2017  ? Environmental allergies 02/15/2017  ? Knee pain, right 10/01/2010  ? Gait abnormality 10/01/2010  ?  ?Allergies:  ?Allergies  ?Allergen Reactions  ? Amoxicillin-Pot Clavulanate Anaphylaxis  ? Covid-19 Mrna Vacc (Moderna) Anaphylaxis  ? Macrobid [Nitrofurantoin Macrocrystal] Swelling and Rash  ? Basaglar [place] [Insulin Glargine] Swelling  ? Penicillins   ? Avelox [Moxifloxacin Hcl In Nacl]  Rash  ? ?Medications:  ?Current Outpatient Medications:  ?  Ascorbic Acid (VITAMIN C) 1000 MG tablet, Take 1,000 mg by mouth daily., Disp: , Rfl:  ?  busPIRone (BUSPAR) 10 MG tablet, Take 1 tablet by mouth 3 (three) times daily., Disp: 270 tablet, Rfl: 0 ?  cetirizine (ZYRTEC) 10 MG tablet, Take 10 mg by mouth daily., Disp: , Rfl:  ?  Continuous Blood Gluc Receiver ([place]) DEVI, Use as directed, Disp: 1 each, Rfl:  0 ?  Continuous Blood Gluc Sensor (DEXCOM G6 SENSOR) MISC, Change every 10 days, Disp: 9 each, Rfl: 3 ?  Continuous Blood Gluc Transmit (DEXCOM G6 TRANSMITTER) MISC, Use as directed every 90 days, Disp: 1 each, Rfl: 3 ?  dorzolamide-timolol (COSOPT) 22.3-6.8 MG/ML ophthalmic solution, Instill 1 drop in both eyes twice a day, Disp: 10 mL, Rfl: 5 ?  EPINEPHrine 0.3 mg/0.3 mL IJ SOAJ injection, Inject 0.3 mLs (0.3 mg total) into the muscle as needed for anaphylaxis., Disp: 1 each, Rfl: 0 ?  fluticasone (FLONASE) 50 MCG/ACT nasal spray, PLACE 1 SPRAY INTO BOTH NOSTRILS DAILY., Disp: 48 g, Rfl: 1 ?  glucose blood test strip, USE TO CHECK BLOOD SUGAR 3 TIMES DAILY (Patient taking differently: USE TO CHECK BLOOD SUGAR 3 TIMES DAILY), Disp: 300 strip, Rfl: 2 ?  HUMULIN N 100 UNIT/ML injection, Inject 0.1 mLs (10 Units total) into the skin at bedtime. (Patient taking differently: Inject 20 Units into the skin at bedtime.), Disp: 30 mL, Rfl: 3 ?  hydrochlorothiazide (HYDRODIURIL) 12.5 MG tablet, Take 1 tablet by mouth daily., Disp: 90 tablet, Rfl: 0 ?  insulin regular (HUMULIN R) 100 units/mL injection, Inject 30 units under the skin at meals 3 times a day, Disp: 90 mL, Rfl: 0 ?  Insulin Syringe-Needle U-100 (TECHLITE INSULIN SYRINGE) 31G X 15/64" 0.3 ML MISC, Use as directed 4 times a day to inject insulin, Disp: 400 each, Rfl: 3 ?  Insulin Syringe-Needle U-100 31G X 5/16" 0.3 ML MISC, USE TO ADMINISTER INSULIN 4 TIMES DAILY AS DIRECTED, Disp: 400 each, Rfl: 3 ?  Lancets (FREESTYLE) lancets, 1 each 3 (three) times daily., Disp: , Rfl:  ?  latanoprost (XALATAN) 0.005 % ophthalmic solution, Instill 1 drop in both eyes at bedtime, Disp: 1 mL, Rfl: 5 ?  lisinopril (ZESTRIL) 10 MG tablet, TAKE 1 TABLET BY MOUTH ONCE A DAY, Disp: 90 tablet, Rfl: 0 ?  metFORMIN (GLUCOPHAGE) 1000 MG tablet, Take 1 tablet by mouth 2  times daily with a meal., Disp: 180 tablet, Rfl: 0 ?  NOVOFINE 32G X 6 MM MISC, , Disp: , Rfl:  ?  pantoprazole  (PROTONIX) 20 MG tablet, Take 1 tablet (20 mg total) by mouth 2 (two) times daily before a meal., Disp: 180 tablet, Rfl: 2 ?  Prenatal Vit-Fe Fumarate-FA (PRENATAL VITAMIN PO), Take 1 tablet by mouth daily., Disp: , Rfl:  ?  tirzepatide (MOUNJARO) 10 MG/0.5ML Pen, Inject 10 mg into the skin once a week. Start after 7.'5mg'$ , Disp: 6 mL, Rfl: 0 ?  tirzepatide (MOUNJARO) 5 MG/0.5ML Pen, Inject 5 mg into the skin once a week., Disp: 6 mL, Rfl: 0 ?  tirzepatide (MOUNJARO) 7.5 MG/0.5ML Pen, Inject 7.5 mg into the skin once a week. (Start after '5mg'$  complete), Disp: 6 mL, Rfl: 0 ?  TRUEPLUS INSULIN SYRINGE 30G X 5/16" 1 ML MISC, USE TO INJECT INSULIN 4 TIMES DAILY, Disp: 300 each, Rfl: 3 ? ?Observations/Objective: ?Patient is well-developed, well-nourished in no acute distress.  ?Resting  comfortably at home.  ?Head is normocephalic, atraumatic.  ?No labored breathing.  ?Speech is clear and coherent with logical content.  ?Patient is alert and oriented at baseline.  ? ?Assessment and Plan: ?1. Acute viral sinusitis ?- brompheniramine-pseudoephedrine-DM 30-2-10 MG/5ML syrup; Take 5 mLs by mouth 4 (four) times daily as needed for up to 7 days.  Dispense: 140 mL; Refill: 0 ?- fluticasone (FLONASE) 50 MCG/ACT nasal spray; Place 2 sprays into both nostrils daily.  Dispense: 16 g; Refill: 0 ?- montelukast (SINGULAIR) 10 MG tablet; Take 1 tablet (10 mg total) by mouth at bedtime.  Dispense: 30 tablet; Refill: 0 ? ?2. Allergic rhinitis, unspecified seasonality, unspecified trigger ?- brompheniramine-pseudoephedrine-DM 30-2-10 MG/5ML syrup; Take 5 mLs by mouth 4 (four) times daily as needed for up to 7 days.  Dispense: 140 mL; Refill: 0 ?- fluticasone (FLONASE) 50 MCG/ACT nasal spray; Place 2 sprays into both nostrils daily.  Dispense: 16 g; Refill: 0 ?- montelukast (SINGULAIR) 10 MG tablet; Take 1 tablet (10 mg total) by mouth at bedtime.  Dispense: 30 tablet; Refill: 0 ? ?1.2. Symptoms are consistent with viral sinusitis and allergic  rhinitis. No symptoms of bacterial infection, is in nad, symptoms duration. Will provide symptomatic treatment with singulair, fluticasone and bromfed DM. Supportive care to include increasing fluids and gettin

## 2021-06-28 NOTE — Patient Instructions (Addendum)
?Amy Ball, thank you for joining Tish Men, NP for today's virtual visit.  While this provider is not your primary care provider (PCP), if your PCP is located in our provider database this encounter information will be shared with them immediately following your visit. ? ?Consent: ?(Patient) Amy Ball provided verbal consent for this virtual visit at the beginning of the encounter. ? ?Current Medications: ? ?Current Outpatient Medications:  ?  brompheniramine-pseudoephedrine-DM 30-2-10 MG/5ML syrup, Take 5 mLs by mouth 4 (four) times daily as needed for up to 7 days., Disp: 140 mL, Rfl: 0 ?  fluticasone (FLONASE) 50 MCG/ACT nasal spray, Place 2 sprays into both nostrils daily., Disp: 16 g, Rfl: 0 ?  montelukast (SINGULAIR) 10 MG tablet, Take 1 tablet (10 mg total) by mouth at bedtime., Disp: 30 tablet, Rfl: 0 ?  Ascorbic Acid (VITAMIN C) 1000 MG tablet, Take 1,000 mg by mouth daily., Disp: , Rfl:  ?  busPIRone (BUSPAR) 10 MG tablet, Take 1 tablet by mouth 3 (three) times daily., Disp: 270 tablet, Rfl: 0 ?  cetirizine (ZYRTEC) 10 MG tablet, Take 10 mg by mouth daily., Disp: , Rfl:  ?  Continuous Blood Gluc Receiver ([place]) DEVI, Use as directed, Disp: 1 each, Rfl: 0 ?  Continuous Blood Gluc Sensor (DEXCOM G6 SENSOR) MISC, Change every 10 days, Disp: 9 each, Rfl: 3 ?  Continuous Blood Gluc Transmit (DEXCOM G6 TRANSMITTER) MISC, Use as directed every 90 days, Disp: 1 each, Rfl: 3 ?  dorzolamide-timolol (COSOPT) 22.3-6.8 MG/ML ophthalmic solution, Instill 1 drop in both eyes twice a day, Disp: 10 mL, Rfl: 5 ?  EPINEPHrine 0.3 mg/0.3 mL IJ SOAJ injection, Inject 0.3 mLs (0.3 mg total) into the muscle as needed for anaphylaxis., Disp: 1 each, Rfl: 0 ?  fluticasone (FLONASE) 50 MCG/ACT nasal spray, PLACE 1 SPRAY INTO BOTH NOSTRILS DAILY., Disp: 48 g, Rfl: 1 ?  glucose blood test strip, USE TO CHECK BLOOD SUGAR 3 TIMES DAILY (Patient taking differently: USE TO CHECK BLOOD SUGAR 3  TIMES DAILY), Disp: 300 strip, Rfl: 2 ?  HUMULIN N 100 UNIT/ML injection, Inject 0.1 mLs (10 Units total) into the skin at bedtime. (Patient taking differently: Inject 20 Units into the skin at bedtime.), Disp: 30 mL, Rfl: 3 ?  hydrochlorothiazide (HYDRODIURIL) 12.5 MG tablet, Take 1 tablet by mouth daily., Disp: 90 tablet, Rfl: 0 ?  insulin regular (HUMULIN R) 100 units/mL injection, Inject 30 units under the skin at meals 3 times a day, Disp: 90 mL, Rfl: 0 ?  Insulin Syringe-Needle U-100 (TECHLITE INSULIN SYRINGE) 31G X 15/64" 0.3 ML MISC, Use as directed 4 times a day to inject insulin, Disp: 400 each, Rfl: 3 ?  Insulin Syringe-Needle U-100 31G X 5/16" 0.3 ML MISC, USE TO ADMINISTER INSULIN 4 TIMES DAILY AS DIRECTED, Disp: 400 each, Rfl: 3 ?  Lancets (FREESTYLE) lancets, 1 each 3 (three) times daily., Disp: , Rfl:  ?  latanoprost (XALATAN) 0.005 % ophthalmic solution, Instill 1 drop in both eyes at bedtime, Disp: 1 mL, Rfl: 5 ?  lisinopril (ZESTRIL) 10 MG tablet, TAKE 1 TABLET BY MOUTH ONCE A DAY, Disp: 90 tablet, Rfl: 0 ?  metFORMIN (GLUCOPHAGE) 1000 MG tablet, Take 1 tablet by mouth 2  times daily with a meal., Disp: 180 tablet, Rfl: 0 ?  NOVOFINE 32G X 6 MM MISC, , Disp: , Rfl:  ?  pantoprazole (PROTONIX) 20 MG tablet, Take 1 tablet (20 mg total) by mouth 2 (two)  times daily before a meal., Disp: 180 tablet, Rfl: 2 ?  Prenatal Vit-Fe Fumarate-FA (PRENATAL VITAMIN PO), Take 1 tablet by mouth daily., Disp: , Rfl:  ?  tirzepatide (MOUNJARO) 10 MG/0.5ML Pen, Inject 10 mg into the skin once a week. Start after 7.'5mg'$ , Disp: 6 mL, Rfl: 0 ?  tirzepatide (MOUNJARO) 5 MG/0.5ML Pen, Inject 5 mg into the skin once a week., Disp: 6 mL, Rfl: 0 ?  tirzepatide (MOUNJARO) 7.5 MG/0.5ML Pen, Inject 7.5 mg into the skin once a week. (Start after '5mg'$  complete), Disp: 6 mL, Rfl: 0 ?  TRUEPLUS INSULIN SYRINGE 30G X 5/16" 1 ML MISC, USE TO INJECT INSULIN 4 TIMES DAILY, Disp: 300 each, Rfl: 3  ? ?Medications ordered in this encounter:   ?Meds ordered this encounter  ?Medications  ? brompheniramine-pseudoephedrine-DM 30-2-10 MG/5ML syrup  ?  Sig: Take 5 mLs by mouth 4 (four) times daily as needed for up to 7 days.  ?  Dispense:  140 mL  ?  Refill:  0  ? fluticasone (FLONASE) 50 MCG/ACT nasal spray  ?  Sig: Place 2 sprays into both nostrils daily.  ?  Dispense:  16 g  ?  Refill:  0  ? montelukast (SINGULAIR) 10 MG tablet  ?  Sig: Take 1 tablet (10 mg total) by mouth at bedtime.  ?  Dispense:  30 tablet  ?  Refill:  0  ?  ? ?*If you need refills on other medications prior to your next appointment, please contact your pharmacy* ? ?Follow-Up: ?Call back or seek an in-person evaluation if the symptoms worsen or if the condition fails to improve as anticipated. ? ?Other Instructions ?Take medication as prescribed. Supportive care to include increasing fluids and getting plenty of rest. Continue to monitor symptoms for worsening. Follow up as needed.  ? ? ?If you have been instructed to have an in-person evaluation today at a local Urgent Care facility, please use the link below. It will take you to a list of all of our available [facility], including address, phone number and hours of operation. Please do not delay care.  ?[facility] ? ?If you or a family member do not have a primary care provider, use the link below to schedule a visit and establish care. When you choose a [facility] primary care physician or advanced practice provider, you gain a long-term partner in health. ?Find a Primary Care Provider ? ?Learn more about 's in-office and virtual care options: ?[facility]' ?

## 2021-06-29 ENCOUNTER — Ambulatory Visit: Payer: 59 | Admitting: Family Medicine

## 2021-06-29 ENCOUNTER — Encounter: Payer: Self-pay | Admitting: Family Medicine

## 2021-06-29 ENCOUNTER — Other Ambulatory Visit (HOSPITAL_COMMUNITY): Payer: Self-pay

## 2021-06-29 VITALS — BP 157/100 | HR 110 | Temp 98.3°F | Ht 65.0 in | Wt 322.0 lb

## 2021-06-29 DIAGNOSIS — J020 Streptococcal pharyngitis: Secondary | ICD-10-CM

## 2021-06-29 DIAGNOSIS — J029 Acute pharyngitis, unspecified: Secondary | ICD-10-CM | POA: Diagnosis not present

## 2021-06-29 MED ORDER — AZITHROMYCIN 250 MG PO TABS: ORAL_TABLET | ORAL | 0 refills | Status: DC

## 2021-06-29 MED ORDER — HYDROCHLOROTHIAZIDE 12.5 MG PO TABS
ORAL_TABLET | Freq: Every day | ORAL | 0 refills | Status: DC
  Filled 2021-06-29: qty 90, 90d supply, fill #0

## 2021-06-29 MED ORDER — BUSPIRONE HCL 10 MG PO TABS
ORAL_TABLET | Freq: Three times a day (TID) | ORAL | 0 refills | Status: DC
  Filled 2021-06-29: qty 270, 90d supply, fill #0

## 2021-06-29 NOTE — Progress Notes (Signed)
? ?BP (!) 157/100   Pulse (!) 110   Temp 98.3 ?F (36.8 ?C)   Ht '5\' 5"'$  (1.651 m)   Wt (!) 322 lb (146.1 kg)   SpO2 98%   BMI 53.58 kg/m?   ? ?Subjective:  ? ?Patient ID: Amy Ball, female    DOB: Oct 31, 1980, 41 y.o.   MRN: [id number] ? ?HPI: ?Amy Ball is a 41 y.o. female presenting on 06/29/2021 for Sore Throat ? ? ?HPI ?Sore throat ?Patient is coming in today with sore throat and congestion.  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  She also has some ear pressure and fullness and pain going to the side of her mouth. ? ?Relevant past medical, surgical, family and social history reviewed and updated as indicated. Interim medical history since our last visit reviewed. ?Allergies and medications reviewed and updated. ? ?Review of Systems  ?Constitutional:  Positive for fever. Negative for chills.  ?HENT:  Positive for ear pain, postnasal drip, rhinorrhea, sinus pressure and sore throat. Negative for congestion, ear discharge and sneezing.   ?Eyes:  Negative for pain, redness and visual disturbance.  ?Respiratory:  Negative for cough, chest tightness and shortness of breath.   ?Cardiovascular:  Negative for chest pain and leg swelling.  ?Skin:  Negative for rash.  ?Neurological:  Negative for light-headedness and headaches.  ?Psychiatric/Behavioral:  Negative for agitation and behavioral problems.   ?All other systems reviewed and are negative. ? ?Per HPI unless specifically indicated above ? ? ?Allergies as of 06/29/2021   ? ?   Reactions  ? Amoxicillin-pot Clavulanate Anaphylaxis  ? Covid-19 Mrna Vacc (moderna) Anaphylaxis  ? Macrobid [nitrofurantoin  Macrocrystal] Swelling, Rash  ? Basaglar Kwikpen [insulin Glargine] Swelling  ? Penicillins   ? Avelox [moxifloxacin Hcl In Nacl] Rash  ? ?  ? ?  ?Medication List  ?  ? ?  ? Accurate as of June 29, 2021 11:50 AM. If you have any questions, ask your nurse or doctor.  ?  ?  ? ?  ? ?azithromycin 250 MG tablet ?Commonly known as: ZITHROMAX ?Take 2 the first day and then one each day after. ?Started by: Worthy Rancher, MD ?  ?brompheniramine-pseudoephedrine-DM 30-2-10 MG/5ML syrup ?Take 5 mLs by mouth 4 (four) times daily as needed for up to 7 days. ?  ?busPIRone 10 MG tablet ?Commonly known as: BUSPAR ?Take 1 tablet by mouth 3 (three) times daily. ?  ?cetirizine 10 MG tablet ?Commonly known as: ZYRTEC ?Take 10 mg by mouth daily. ?  ?Dexcom G6 Receiver Kerrin Mo ?Use as directed ?  ?Dexcom G6 Sensor Misc ?Change every 10 days ?  ?Dexcom G6 Transmitter Misc ?Use as directed every 90 days ?  ?dorzolamide-timolol 22.3-6.8 MG/ML ophthalmic solution ?Commonly known as: Cosopt ?Instill 1 drop in both eyes twice a day ?  ?EPINEPHrine 0.3 mg/0.3 mL Soaj injection ?Commonly known as: EPI-PEN ?Inject 0.3 mLs (0.3 mg total) into the muscle as needed for anaphylaxis. ?  ?fluticasone 50 MCG/ACT nasal spray ?Commonly known as: FLONASE ?PLACE 1 SPRAY INTO BOTH NOSTRILS DAILY. ?  ?fluticasone 50 MCG/ACT nasal spray ?  Commonly known as: FLONASE ?Place 2 sprays into both nostrils daily. ?  ?freestyle lancets ?1 each 3 (three) times daily. ?  ?FREESTYLE LITE test strip ?Generic drug: glucose blood ?USE TO CHECK BLOOD SUGAR 3 TIMES DAILY ?  ?HumuLIN N 100 UNIT/ML injection ?Generic drug: insulin NPH Human ?Inject 0.1 mLs (10 Units total) into the skin at bedtime. ?What changed: how much to take ?  ?HumuLIN R 100 units/mL injection ?Generic drug: insulin regular ?Inject 30 units under the skin at meals 3 times a day ?  ?hydrochlorothiazide 12.5 MG tablet ?Commonly known as: HYDRODIURIL ?Take 1 tablet by mouth daily. ?  ?latanoprost 0.005 %  ophthalmic solution ?Commonly known as: XALATAN ?Instill 1 drop in both eyes at bedtime ?  ?lisinopril 10 MG tablet ?Commonly known as: ZESTRIL ?TAKE 1 TABLET BY MOUTH ONCE A DAY ?  ?metFORMIN 1000 MG tablet ?Commonly known as: GLUCOPHAGE ?Take 1 tablet by mouth 2  times daily with a meal. ?  ?montelukast 10 MG tablet ?Commonly known as: SINGULAIR ?Take 1 tablet (10 mg total) by mouth at bedtime. ?  ?Mounjaro 5 MG/0.5ML Pen ?Generic drug: tirzepatide ?Inject 5 mg into the skin once a week. ?  ?tirzepatide 7.5 MG/0.5ML Pen ?Commonly known as: MOUNJARO ?Inject 7.5 mg into the skin once a week. (Start after '5mg'$  complete) ?  ?tirzepatide 10 MG/0.5ML Pen ?Commonly known as: MOUNJARO ?Inject 10 mg into the skin once a week. Start after 7.'5mg'$  ?  ?NovoFine 32G X 6 MM Misc ?Generic drug: Insulin Pen Needle ?  ?pantoprazole 20 MG tablet ?Commonly known as: PROTONIX ?Take 1 tablet (20 mg total) by mouth 2 (two) times daily before a meal. ?  ?PRENATAL VITAMIN PO ?Take 1 tablet by mouth daily. ?  ?TRUEplus Insulin Syringe 30G X 5/16" 1 ML Misc ?Generic drug: Insulin Syringe-Needle U-100 ?USE TO INJECT INSULIN 4 TIMES DAILY ?  ?Insulin Syringe-Needle U-100 31G X 5/16" 0.3 ML Misc ?USE TO ADMINISTER INSULIN 4 TIMES DAILY AS DIRECTED ?  ?TechLITE Insulin Syringe 31G X 15/64" 0.3 ML Misc ?Generic drug: Insulin Syringe-Needle U-100 ?Use as directed 4 times a day to inject insulin ?  ?vitamin C 1000 MG tablet ?Take 1,000 mg by mouth daily. ?  ? ?  ? ? ? ?Objective:  ? ?BP (!) 157/100   Pulse (!) 110   Temp 98.3 ?F (36.8 ?C)   Ht '5\' 5"'$  (1.651 m)   Wt (!) 322 lb (146.1 kg)   SpO2 98%   BMI 53.58 kg/m?   ?Wt Readings from Last 3 Encounters:  ?06/29/21 (!) 322 lb (146.1 kg)  ?06/02/21 (!) 325 lb 12.8 oz (147.8 kg)  ?04/09/21 (!) 324 lb 6.4 oz (147.1 kg)  ?  ?Physical Exam ?Vitals reviewed.  ?Constitutional:   ?   General: She is not in acute distress. ?   Appearance: She is well-developed. She is not diaphoretic.  ?HENT:  ?   Right  Ear: Tympanic membrane, ear canal and external ear normal.  ?   Left Ear: Tympanic membrane, ear canal and external ear normal.  ?   Nose: Mucosal edema present. No rhinorrhea.  ?   Right Sinus: No maxillary sinus tenderness or frontal sinus tenderness.  ?   Left Sinus: No maxillary sinus tenderness or frontal sinus tenderness.  ?   Mouth/Throat:  ?   Pharynx: Uvula midline. Posterior oropharyngeal erythema present. No oropharyngeal exudate.  ?   Tonsils: No tonsillar abscesses.  ?Eyes:  ?   Conjunctiva/sclera: Conjunctivae  normal.  ?Cardiovascular:  ?   Rate and Rhythm: Normal rate and regular rhythm.  ?   Heart sounds: Normal heart sounds. No murmur heard. ?Pulmonary:  ?   Effort: Pulmonary effort is normal. No respiratory distress.  ?   Breath sounds: Normal breath sounds. No wheezing.  ?Musculoskeletal:     ?   General: No tenderness. Normal range of motion.  ?Skin: ?   General: Skin is warm and dry.  ?   Findings: No rash.  ?Neurological:  ?   Mental Status: She is alert and oriented to person, place, and time.  ?   Coordination: Coordination normal.  ?Psychiatric:     ?   Behavior: Behavior normal.  ? ? ?Strep positive ? ?Assessment & Plan:  ? ?Problem List Items Addressed This Visit   ?None ?Visit Diagnoses   ? ? Strep pharyngitis    -  Primary  ? Relevant Medications  ? azithromycin (ZITHROMAX) 250 MG tablet  ? Other Relevant Orders  ? Rapid Strep Screen ([facility] ONLY)  ? ?  ?  ?Patient has anaphylaxis to amoxicillin so we are avoiding cephalosporins and based on possible treatment options that leaves [place] with a macrolide such as azithromycin versus clindamycin and is says both have possibility of resistance in the community, due to less side effect profile will go with azithromycin but if that does not work then she may need clindamycin. ?Follow up plan: ?Return if symptoms worsen or fail to improve. ? ?Counseling provided for all of the vaccine components ?Orders Placed This Encounter  ?Procedures  ?  Rapid Strep Screen ([facility] ONLY)  ? ? ?Caryl Pina, MD ?[place] ?06/29/2021, 11:50 AM ? ? ? ? ?

## 2021-06-30 ENCOUNTER — Encounter: Payer: Self-pay | Admitting: Family Medicine

## 2021-06-30 ENCOUNTER — Ambulatory Visit (INDEPENDENT_AMBULATORY_CARE_PROVIDER_SITE_OTHER): Payer: 59 | Admitting: Family Medicine

## 2021-06-30 DIAGNOSIS — E113513 Type 2 diabetes mellitus with proliferative diabetic retinopathy with macular edema, bilateral: Secondary | ICD-10-CM | POA: Diagnosis not present

## 2021-06-30 DIAGNOSIS — J020 Streptococcal pharyngitis: Secondary | ICD-10-CM

## 2021-06-30 DIAGNOSIS — Z794 Long term (current) use of insulin: Secondary | ICD-10-CM | POA: Diagnosis not present

## 2021-06-30 LAB — RAPID STREP SCREEN (MED CTR MEBANE ONLY): Strep Gp A Ag, IA W/Reflex: POSITIVE — AB

## 2021-06-30 NOTE — Progress Notes (Signed)
Telephone visit ? ?Subjective: ?CC:DM ?PCP: Janora Norlander, DO ?[id number] Amy Ball is a 41 y.o. female calls for telephone consult today. Patient provides verbal consent for consult held via phone. ? ?Due to COVID-19 pandemic this visit was conducted virtually. This visit type was conducted due to national recommendations for restrictions regarding the COVID-19 Pandemic (e.g. social distancing, sheltering in place) in an effort to limit this patient's exposure and mitigate transmission in our community. All issues noted in this document were discussed and addressed.  A physical exam was not performed with this format.  ? ?Location of patient: home ?Location of provider: [facility] ?Others present for call: none ? ?1. DM ?Has noticed a positive impact on hunger/ appetite.  She is down 3lbs.  She gets full easily and she denies any GI side effects.  No allergic reaction.  BGs are no longer spiking with meals.  Still having 200s BGs.  Staying in 100s most of the time.  Will start '5mg'$  this Saturday. ? ?2.  Strep throat ?She reports good response to the Z-Pak that she started yesterday.  She will be out until Thursday from work but plans to return.  Tolerating p.o. intake without difficulty now ? ? ?ROS: Per HPI ? ?Allergies  ?Allergen Reactions  ? Amoxicillin-Pot Clavulanate Anaphylaxis  ? Covid-19 Mrna Vacc (Moderna) Anaphylaxis  ? Macrobid [Nitrofurantoin Macrocrystal] Swelling and Rash  ? Basaglar Claiborne Rigg [Insulin Glargine] Swelling  ? Penicillins   ? Avelox [Moxifloxacin Hcl In Nacl] Rash  ? ?Past Medical History:  ?Diagnosis Date  ? Environmental allergies   ? GAD (generalized anxiety disorder) 12/05/2018  ? GERD (gastroesophageal reflux disease)   ? Hypertension   ? Type 2 diabetes mellitus ([place])   ? ? ?Current Outpatient Medications:  ?  Ascorbic Acid (VITAMIN C) 1000 MG tablet, Take 1,000 mg by mouth daily., Disp: , Rfl:  ?  azithromycin (ZITHROMAX) 250 MG tablet, Take 2 the first day and then one each day  after., Disp: 6 tablet, Rfl: 0 ?  brompheniramine-pseudoephedrine-DM 30-2-10 MG/5ML syrup, Take 5 mLs by mouth 4 (four) times daily as needed for up to 7 days., Disp: 140 mL, Rfl: 0 ?  busPIRone (BUSPAR) 10 MG tablet, Take 1 tablet by mouth 3 (three) times daily., Disp: 270 tablet, Rfl: 0 ?  cetirizine (ZYRTEC) 10 MG tablet, Take 10 mg by mouth daily., Disp: , Rfl:  ?  Continuous Blood Gluc Receiver ([place]) DEVI, Use as directed, Disp: 1 each, Rfl: 0 ?  Continuous Blood Gluc Sensor (DEXCOM G6 SENSOR) MISC, Change every 10 days, Disp: 9 each, Rfl: 3 ?  Continuous Blood Gluc Transmit (DEXCOM G6 TRANSMITTER) MISC, Use as directed every 90 days, Disp: 1 each, Rfl: 3 ?  dorzolamide-timolol (COSOPT) 22.3-6.8 MG/ML ophthalmic solution, Instill 1 drop in both eyes twice a day, Disp: 10 mL, Rfl: 5 ?  EPINEPHrine 0.3 mg/0.3 mL IJ SOAJ injection, Inject 0.3 mLs (0.3 mg total) into the muscle as needed for anaphylaxis., Disp: 1 each, Rfl: 0 ?  fluticasone (FLONASE) 50 MCG/ACT nasal spray, PLACE 1 SPRAY INTO BOTH NOSTRILS DAILY., Disp: 48 g, Rfl: 1 ?  fluticasone (FLONASE) 50 MCG/ACT nasal spray, Place 2 sprays into both nostrils daily., Disp: 16 g, Rfl: 0 ?  glucose blood test strip, USE TO CHECK BLOOD SUGAR 3 TIMES DAILY (Patient taking differently: USE TO CHECK BLOOD SUGAR 3 TIMES DAILY), Disp: 300 strip, Rfl: 2 ?  HUMULIN N 100 UNIT/ML injection, Inject 0.1 mLs (10 Units total)  into the skin at bedtime. (Patient taking differently: Inject 20 Units into the skin at bedtime.), Disp: 30 mL, Rfl: 3 ?  hydrochlorothiazide (HYDRODIURIL) 12.5 MG tablet, Take 1 tablet by mouth daily., Disp: 90 tablet, Rfl: 0 ?  insulin regular (HUMULIN R) 100 units/mL injection, Inject 30 units under the skin at meals 3 times a day, Disp: 90 mL, Rfl: 0 ?  Insulin Syringe-Needle U-100 (TECHLITE INSULIN SYRINGE) 31G X 15/64" 0.3 ML MISC, Use as directed 4 times a day to inject insulin, Disp: 400 each, Rfl: 3 ?  Insulin Syringe-Needle  U-100 31G X 5/16" 0.3 ML MISC, USE TO ADMINISTER INSULIN 4 TIMES DAILY AS DIRECTED, Disp: 400 each, Rfl: 3 ?  Lancets (FREESTYLE) lancets, 1 each 3 (three) times daily., Disp: , Rfl:  ?  latanoprost (XALATAN) 0.005 % ophthalmic solution, Instill 1 drop in both eyes at bedtime, Disp: 1 mL, Rfl: 5 ?  lisinopril (ZESTRIL) 10 MG tablet, TAKE 1 TABLET BY MOUTH ONCE A DAY, Disp: 90 tablet, Rfl: 0 ?  metFORMIN (GLUCOPHAGE) 1000 MG tablet, Take 1 tablet by mouth 2  times daily with a meal., Disp: 180 tablet, Rfl: 0 ?  montelukast (SINGULAIR) 10 MG tablet, Take 1 tablet (10 mg total) by mouth at bedtime., Disp: 30 tablet, Rfl: 0 ?  NOVOFINE 32G X 6 MM MISC, , Disp: , Rfl:  ?  pantoprazole (PROTONIX) 20 MG tablet, Take 1 tablet (20 mg total) by mouth 2 (two) times daily before a meal., Disp: 180 tablet, Rfl: 2 ?  Prenatal Vit-Fe Fumarate-FA (PRENATAL VITAMIN PO), Take 1 tablet by mouth daily., Disp: , Rfl:  ?  tirzepatide (MOUNJARO) 10 MG/0.5ML Pen, Inject 10 mg into the skin once a week. Start after 7.'5mg'$ , Disp: 6 mL, Rfl: 0 ?  tirzepatide (MOUNJARO) 5 MG/0.5ML Pen, Inject 5 mg into the skin once a week., Disp: 6 mL, Rfl: 0 ?  tirzepatide (MOUNJARO) 7.5 MG/0.5ML Pen, Inject 7.5 mg into the skin once a week. (Start after '5mg'$  complete), Disp: 6 mL, Rfl: 0 ?  TRUEPLUS INSULIN SYRINGE 30G X 5/16" 1 ML MISC, USE TO INJECT INSULIN 4 TIMES DAILY, Disp: 300 each, Rfl: 3 ? ?Assessment/ Plan: ?41 y.o. female  ? ?Type 2 diabetes mellitus with both eyes affected by proliferative retinopathy and macular edema, with long-term current use of insulin ([place]) ? ?Morbid obesity ([place]) ? ?Strep pharyngitis ? ?Doing really well from a Mounjaro tolerance.  She is had no adverse side effects that are concerning for allergy or anaphylaxis.  She will proceed with 5 mg dosing starting on Saturday.  She has 3 months worth of medication at her pharmacy with each dose increasing by 2.5 mg each month.  She will reach out to me if she has any issues going  forward and we can extend dosages for more than 1 month if needed.  Continue to monitor blood sugars.  Titrate insulin down if needed ? ?Strep pharyngitis seems to be responding to the Z-Pak that was prescribed yesterday.  She will reach out to [place] if she needs a another pack ? ?Start time: 12:48pm ?End time: 1:05pm ? ?Total time spent on patient care (including telephone call/ virtual visit): 17 minutes ? ?Janora Norlander, DO ?[place] ?[phone] ? ?

## 2021-07-13 ENCOUNTER — Other Ambulatory Visit: Payer: Self-pay | Admitting: Family Medicine

## 2021-07-13 ENCOUNTER — Other Ambulatory Visit (HOSPITAL_COMMUNITY): Payer: Self-pay

## 2021-07-14 ENCOUNTER — Other Ambulatory Visit (HOSPITAL_COMMUNITY): Payer: Self-pay

## 2021-07-14 MED ORDER — FLUTICASONE PROPIONATE 50 MCG/ACT NA SUSP
1.0000 | Freq: Every day | NASAL | 1 refills | Status: DC
Start: 2021-07-14 — End: 2022-07-21
  Filled 2021-07-14 – 2021-08-30 (×2): qty 16, 60d supply, fill #0
  Filled 2021-10-26: qty 16, 60d supply, fill #1
  Filled 2021-12-13: qty 16, 60d supply, fill #2
  Filled 2022-01-18 – 2022-02-09 (×2): qty 16, 60d supply, fill #3
  Filled 2022-04-11: qty 16, 60d supply, fill #4
  Filled 2022-05-03 – 2022-06-06 (×2): qty 16, 60d supply, fill #5

## 2021-07-17 ENCOUNTER — Ambulatory Visit: Payer: 59 | Admitting: Family Medicine

## 2021-07-17 ENCOUNTER — Encounter: Payer: Self-pay | Admitting: Family Medicine

## 2021-07-17 ENCOUNTER — Other Ambulatory Visit (HOSPITAL_COMMUNITY): Payer: Self-pay

## 2021-07-17 VITALS — BP 150/83 | HR 110 | Temp 98.0°F | Ht 65.0 in | Wt 326.4 lb

## 2021-07-17 DIAGNOSIS — I10 Essential (primary) hypertension: Secondary | ICD-10-CM | POA: Diagnosis not present

## 2021-07-17 MED ORDER — LISINOPRIL 20 MG PO TABS
20.0000 mg | ORAL_TABLET | Freq: Every day | ORAL | 1 refills | Status: DC
  Filled 2021-07-17: qty 30, 30d supply, fill #0

## 2021-07-17 NOTE — Progress Notes (Signed)
? ?Subjective:  ?Patient ID: Amy Ball, female    DOB: 30-Aug-1980  Age: 41 y.o. MRN: [id number] ? ?CC: Hypertension ? ? ?HPI ?Amy Ball presents for elevated BP readings at home. Up to 671 systolic today.  Its been high on multiple occasions lately.  She has been using a wrist monitor because the arm monitor hurt her arm because it squeezed too tight.  She was not aware to get a large adult size.  She has not had any symptoms to go along with this elevated blood pressure.  This includes headache chest pain shortness of breath palpitations and edema.  The blood pressure does seem to be getting higher although she has had multiple readings that were elevated going back about a year.  She continues to take lisinopril and HCTZ for her blood pressure. ? ? ?  07/17/2021  ? 10:24 AM 06/02/2021  ?  2:05 PM 03/04/2021  ?  2:35 PM  ?Depression screen PHQ 2/9  ?Decreased Interest 0 0 0  ?Down, Depressed, Hopeless 0 0 0  ?PHQ - 2 Score 0 0 0  ?Altered sleeping   0  ?Tired, decreased energy   1  ?Change in appetite   0  ?Feeling bad or failure about yourself    0  ?Trouble concentrating   0  ?Moving slowly or fidgety/restless   0  ?Suicidal thoughts   0  ?PHQ-9 Score   1  ?Difficult doing work/chores   Not difficult at all  ? ? ?History ?Rollande has a past medical history of Environmental allergies, GAD (generalized anxiety disorder) (12/05/2018), GERD (gastroesophageal reflux disease), Hypertension, and Type 2 diabetes mellitus ([place]).  ? ?She has a past surgical history that includes Fracture surgery (2007).  ? ?Her family history includes Arthritis in her father; Birth defects in her mother; Cancer in her paternal grandfather; Diabetes in her father; Heart disease (age of onset: 48) in her father; Psoriasis in her brother and father; Stroke in her paternal grandmother.She reports that she has never smoked. She has never used smokeless tobacco. She reports current alcohol use. She reports that she does not use  drugs. ? ? ? ?ROS ?Review of Systems  ?Constitutional: Negative.   ?HENT: Negative.    ?Eyes:  Negative for visual disturbance.  ?Respiratory:  Negative for shortness of breath.   ?Cardiovascular:  Negative for chest pain.  ?Gastrointestinal:  Negative for abdominal pain.  ?Musculoskeletal:  Negative for arthralgias.  ? ?Objective:  ?BP (!) 150/83   Pulse (!) 110   Temp 98 ?F (36.7 ?C)   Ht '5\' 5"'$  (1.651 m)   Wt (!) 326 lb 6.4 oz (148.1 kg)   SpO2 100%   BMI 54.32 kg/m?  ? ?BP Readings from Last 3 Encounters:  ?07/17/21 (!) 150/83  ?06/29/21 (!) 157/100  ?06/02/21 (!) 160/97  ? ? ?Wt Readings from Last 3 Encounters:  ?07/17/21 (!) 326 lb 6.4 oz (148.1 kg)  ?06/29/21 (!) 322 lb (146.1 kg)  ?06/02/21 (!) 325 lb 12.8 oz (147.8 kg)  ? ? ? ?Physical Exam ?Constitutional:   ?   General: She is not in acute distress. ?   Appearance: She is well-developed. She is obese.  ?HENT:  ?   Head:  ?   Comments: Cushingoid facies ?Cardiovascular:  ?   Rate and Rhythm: Normal rate and regular rhythm.  ?Pulmonary:  ?   Breath sounds: Normal breath sounds.  ?Musculoskeletal:     ?   General: Normal range of  motion.  ?Skin: ?   General: Skin is warm and dry.  ?Neurological:  ?   Mental Status: She is alert and oriented to person, place, and time.  ? ? ? ? ?Assessment & Plan:  ? ?Loucinda was seen today for hypertension. ? ?Diagnoses and all orders for this visit: ? ?Accelerated hypertension ? ?Other orders ?-     lisinopril (ZESTRIL) 20 MG tablet; Take 1 tablet by mouth daily. ? ? ? ? ? ? ?I have discontinued Newt Lukes. Neary's lisinopril and azithromycin. I am also having her start on lisinopril. Additionally, I am having her maintain her NovoFine, vitamin C, cetirizine, Prenatal Vit-Fe Fumarate-FA (PRENATAL VITAMIN PO), EPINEPHrine, freestyle, HumuLIN N, TRUEplus Insulin Syringe, glucose blood, Dexcom G6 Receiver, Dexcom G6 Sensor, Dexcom G6 Transmitter, Insulin Syringe-Needle U-100, pantoprazole, TechLITE Insulin Syringe,  dorzolamide-timolol, latanoprost, insulin regular, tirzepatide, tirzepatide, tirzepatide, metFORMIN, busPIRone, montelukast, hydrochlorothiazide, and fluticasone. ? ?Allergies as of 07/17/2021   ? ?   Reactions  ? Amoxicillin-pot Clavulanate Anaphylaxis  ? Covid-19 Mrna Vacc (moderna) Anaphylaxis  ? Macrobid [nitrofurantoin Macrocrystal] Swelling, Rash  ? Basaglar Kwikpen [insulin Glargine] Swelling  ? Penicillins   ? Avelox [moxifloxacin Hcl In Nacl] Rash  ? ?  ? ?  ?Medication List  ?  ? ?  ? Accurate as of July 17, 2021  4:37 PM. If you have any questions, ask your nurse or doctor.  ?  ?  ? ?  ? ?STOP taking these medications   ? ?azithromycin 250 MG tablet ?Commonly known as: ZITHROMAX ?Stopped by: Claretta Fraise, MD ?  ? ?  ? ?TAKE these medications   ? ?busPIRone 10 MG tablet ?Commonly known as: BUSPAR ?Take 1 tablet by mouth 3 (three) times daily. ?  ?cetirizine 10 MG tablet ?Commonly known as: ZYRTEC ?Take 10 mg by mouth daily. ?  ?Dexcom G6 Receiver Kerrin Mo ?Use as directed ?  ?Dexcom G6 Sensor Misc ?Change every 10 days ?  ?Dexcom G6 Transmitter Misc ?Use as directed every 90 days ?  ?dorzolamide-timolol 22.3-6.8 MG/ML ophthalmic solution ?Commonly known as: Cosopt ?Instill 1 drop in both eyes twice a day ?  ?EPINEPHrine 0.3 mg/0.3 mL Soaj injection ?Commonly known as: EPI-PEN ?Inject 0.3 mLs (0.3 mg total) into the muscle as needed for anaphylaxis. ?  ?fluticasone 50 MCG/ACT nasal spray ?Commonly known as: FLONASE ?PLACE 1 SPRAY INTO BOTH NOSTRILS DAILY. ?  ?freestyle lancets ?1 each 3 (three) times daily. ?  ?FREESTYLE LITE test strip ?Generic drug: glucose blood ?USE TO CHECK BLOOD SUGAR 3 TIMES DAILY ?  ?HumuLIN N 100 UNIT/ML injection ?Generic drug: insulin NPH Human ?Inject 0.1 mLs (10 Units total) into the skin at bedtime. ?What changed: how much to take ?  ?HumuLIN R 100 units/mL injection ?Generic drug: insulin regular ?Inject 30 units under the skin at meals 3 times a day ?  ?hydrochlorothiazide 12.5  MG tablet ?Commonly known as: HYDRODIURIL ?Take 1 tablet by mouth daily. ?  ?latanoprost 0.005 % ophthalmic solution ?Commonly known as: XALATAN ?Instill 1 drop in both eyes at bedtime ?  ?lisinopril 20 MG tablet ?Commonly known as: ZESTRIL ?Take 1 tablet by mouth daily. ?What changed:  ?medication strength ?how much to take ?Changed by: Claretta Fraise, MD ?  ?metFORMIN 1000 MG tablet ?Commonly known as: GLUCOPHAGE ?Take 1 tablet by mouth 2  times daily with a meal. ?  ?montelukast 10 MG tablet ?Commonly known as: SINGULAIR ?Take 1 tablet (10 mg total) by mouth at bedtime. ?  ?Mounjaro 5  MG/0.5ML Pen ?Generic drug: tirzepatide ?Inject 5 mg into the skin once a week. ?  ?tirzepatide 7.5 MG/0.5ML Pen ?Commonly known as: MOUNJARO ?Inject 7.5 mg into the skin once a week. (Start after '5mg'$  complete) ?  ?tirzepatide 10 MG/0.5ML Pen ?Commonly known as: MOUNJARO ?Inject 10 mg into the skin once a week. Start after 7.'5mg'$  ?  ?NovoFine 32G X 6 MM Misc ?Generic drug: Insulin Pen Needle ?  ?pantoprazole 20 MG tablet ?Commonly known as: PROTONIX ?Take 1 tablet (20 mg total) by mouth 2 (two) times daily before a meal. ?  ?PRENATAL VITAMIN PO ?Take 1 tablet by mouth daily. ?  ?TRUEplus Insulin Syringe 30G X 5/16" 1 ML Misc ?Generic drug: Insulin Syringe-Needle U-100 ?USE TO INJECT INSULIN 4 TIMES DAILY ?  ?Insulin Syringe-Needle U-100 31G X 5/16" 0.3 ML Misc ?USE TO ADMINISTER INSULIN 4 TIMES DAILY AS DIRECTED ?  ?TechLITE Insulin Syringe 31G X 15/64" 0.3 ML Misc ?Generic drug: Insulin Syringe-Needle U-100 ?Use as directed 4 times a day to inject insulin ?  ?vitamin C 1000 MG tablet ?Take 1,000 mg by mouth daily. ?  ? ?  ? ? ? ?Follow-up: Return in about 2 weeks (around 07/31/2021) for hypertension. ? ?Claretta Fraise, M.D. ?

## 2021-07-18 ENCOUNTER — Other Ambulatory Visit (HOSPITAL_COMMUNITY): Payer: Self-pay

## 2021-07-18 MED ORDER — LATANOPROST 0.005 % OP SOLN
OPHTHALMIC | 0 refills | Status: DC
Start: 2021-07-18 — End: 2021-07-20
  Filled 2021-07-18: qty 2.5, 25d supply, fill #0

## 2021-07-20 ENCOUNTER — Other Ambulatory Visit: Payer: Self-pay | Admitting: Nurse Practitioner

## 2021-07-20 ENCOUNTER — Other Ambulatory Visit (HOSPITAL_COMMUNITY): Payer: Self-pay

## 2021-07-20 DIAGNOSIS — B9789 Other viral agents as the cause of diseases classified elsewhere: Secondary | ICD-10-CM

## 2021-07-20 DIAGNOSIS — J309 Allergic rhinitis, unspecified: Secondary | ICD-10-CM

## 2021-07-20 MED ORDER — LATANOPROST 0.005 % OP SOLN
OPHTHALMIC | 0 refills | Status: DC
  Filled 2021-07-20 – 2021-09-16 (×2): qty 2.5, 25d supply, fill #0

## 2021-07-20 MED ORDER — LATANOPROST 0.005 % OP SOLN
OPHTHALMIC | 0 refills | Status: DC
  Filled 2021-07-20: qty 2.5, 25d supply, fill #0
  Filled 2021-08-23: qty 2.5, 20d supply, fill #0

## 2021-07-27 ENCOUNTER — Ambulatory Visit: Payer: 59 | Admitting: Family Medicine

## 2021-07-27 ENCOUNTER — Other Ambulatory Visit (HOSPITAL_COMMUNITY): Payer: Self-pay

## 2021-07-27 ENCOUNTER — Encounter: Payer: Self-pay | Admitting: Family Medicine

## 2021-07-27 VITALS — BP 159/89 | HR 100 | Temp 98.2°F | Ht 65.0 in | Wt 323.0 lb

## 2021-07-27 DIAGNOSIS — E1159 Type 2 diabetes mellitus with other circulatory complications: Secondary | ICD-10-CM | POA: Diagnosis not present

## 2021-07-27 DIAGNOSIS — K219 Gastro-esophageal reflux disease without esophagitis: Secondary | ICD-10-CM

## 2021-07-27 DIAGNOSIS — I152 Hypertension secondary to endocrine disorders: Secondary | ICD-10-CM | POA: Diagnosis not present

## 2021-07-27 DIAGNOSIS — R110 Nausea: Secondary | ICD-10-CM

## 2021-07-27 MED ORDER — PANTOPRAZOLE SODIUM 40 MG PO TBEC
40.0000 mg | DELAYED_RELEASE_TABLET | Freq: Two times a day (BID) | ORAL | 1 refills | Status: DC
  Filled 2021-07-27: qty 180, 90d supply, fill #0
  Filled 2021-11-17: qty 180, 90d supply, fill #1

## 2021-07-27 MED ORDER — ONDANSETRON 4 MG PO TBDP
4.0000 mg | ORAL_TABLET | Freq: Three times a day (TID) | ORAL | 0 refills | Status: DC | PRN
  Filled 2021-07-27: qty 20, 7d supply, fill #0

## 2021-07-27 NOTE — Progress Notes (Signed)
? ?Subjective: ?CC:HTN ?PCP: Janora Norlander, DO ?IBB:[id number] Amy Ball is a 41 y.o. female presenting to clinic today for: ? ?1. HTN associated with type 2 diabetes/ nausea/ GERD ?Patient reports that overall she is really liking the [facility].  She does report increased acid reflux and this is very apparent after day of injection.  She has been getting severe symptoms such that she will become nauseated and vomit and in fact had an episode of nausea, vomiting and diarrhea recently but she was exposed to viral gastroenteritis from school.  She is compliant with her Protonix 20 mg twice daily and has been taking Pepcid OTC in addition to this but again acid reflux is not well controlled.  Her blood pressures have been running typically around 120s over 70s or less on her blood pressure monitor.  She had 1 outlier where systolics were in the 450T but this was when she was ill. ? ? ?ROS: Per HPI ? ?Allergies  ?Allergen Reactions  ? Amoxicillin-Pot Clavulanate Anaphylaxis  ? Covid-19 Mrna Vacc (Moderna) Anaphylaxis  ? Macrobid [Nitrofurantoin Macrocrystal] Swelling and Rash  ? Basaglar [place] Rigg [Insulin Glargine] Swelling  ? Penicillins   ? Avelox [Moxifloxacin Hcl In Nacl] Rash  ? ?Past Medical History:  ?Diagnosis Date  ? Environmental allergies   ? GAD (generalized anxiety disorder) 12/05/2018  ? GERD (gastroesophageal reflux disease)   ? Hypertension   ? Type 2 diabetes mellitus ([place])   ? ? ?Current Outpatient Medications:  ?  Ascorbic Acid (VITAMIN C) 1000 MG tablet, Take 1,000 mg by mouth daily., Disp: , Rfl:  ?  busPIRone (BUSPAR) 10 MG tablet, Take 1 tablet by mouth 3 (three) times daily., Disp: 270 tablet, Rfl: 0 ?  cetirizine (ZYRTEC) 10 MG tablet, Take 10 mg by mouth daily., Disp: , Rfl:  ?  Continuous Blood Gluc Receiver ([place]) DEVI, Use as directed, Disp: 1 each, Rfl: 0 ?  Continuous Blood Gluc Sensor (DEXCOM G6 SENSOR) MISC, Change every 10 days, Disp: 9 each, Rfl: 3 ?  Continuous Blood  Gluc Transmit (DEXCOM G6 TRANSMITTER) MISC, Use as directed every 90 days, Disp: 1 each, Rfl: 3 ?  dorzolamide-timolol (COSOPT) 22.3-6.8 MG/ML ophthalmic solution, Instill 1 drop in both eyes twice a day, Disp: 10 mL, Rfl: 5 ?  EPINEPHrine 0.3 mg/0.3 mL IJ SOAJ injection, Inject 0.3 mLs (0.3 mg total) into the muscle as needed for anaphylaxis., Disp: 1 each, Rfl: 0 ?  fluticasone (FLONASE) 50 MCG/ACT nasal spray, PLACE 1 SPRAY INTO BOTH NOSTRILS DAILY., Disp: 48 g, Rfl: 1 ?  glucose blood test strip, USE TO CHECK BLOOD SUGAR 3 TIMES DAILY (Patient taking differently: USE TO CHECK BLOOD SUGAR 3 TIMES DAILY), Disp: 300 strip, Rfl: 2 ?  HUMULIN N 100 UNIT/ML injection, Inject 0.1 mLs (10 Units total) into the skin at bedtime. (Patient taking differently: Inject 20 Units into the skin at bedtime.), Disp: 30 mL, Rfl: 3 ?  hydrochlorothiazide (HYDRODIURIL) 12.5 MG tablet, Take 1 tablet by mouth daily., Disp: 90 tablet, Rfl: 0 ?  insulin regular (HUMULIN R) 100 units/mL injection, Inject 30 units under the skin at meals 3 times a day, Disp: 90 mL, Rfl: 0 ?  Insulin Syringe-Needle U-100 (TECHLITE INSULIN SYRINGE) 31G X 15/64" 0.3 ML MISC, Use as directed 4 times a day to inject insulin, Disp: 400 each, Rfl: 3 ?  Insulin Syringe-Needle U-100 31G X 5/16" 0.3 ML MISC, USE TO ADMINISTER INSULIN 4 TIMES DAILY AS DIRECTED, Disp: 400 each, Rfl:  3 ?  Lancets (FREESTYLE) lancets, 1 each 3 (three) times daily., Disp: , Rfl:  ?  latanoprost (XALATAN) 0.005 % ophthalmic solution, Instill 1 drop in both eyes at bedtime, Disp: 2.5 mL, Rfl: 0 ?  latanoprost (XALATAN) 0.005 % ophthalmic solution, Instill 1 drop in both eyes at bedtime, Disp: 2.5 mL, Rfl: 0 ?  lisinopril (ZESTRIL) 20 MG tablet, Take 1 tablet by mouth daily., Disp: 30 tablet, Rfl: 1 ?  metFORMIN (GLUCOPHAGE) 1000 MG tablet, Take 1 tablet by mouth 2  times daily with a meal., Disp: 180 tablet, Rfl: 0 ?  montelukast (SINGULAIR) 10 MG tablet, Take 1 tablet (10 mg total) by  mouth at bedtime., Disp: 30 tablet, Rfl: 0 ?  NOVOFINE 32G X 6 MM MISC, , Disp: , Rfl:  ?  pantoprazole (PROTONIX) 20 MG tablet, Take 1 tablet (20 mg total) by mouth 2 (two) times daily before a meal., Disp: 180 tablet, Rfl: 2 ?  Prenatal Vit-Fe Fumarate-FA (PRENATAL VITAMIN PO), Take 1 tablet by mouth daily., Disp: , Rfl:  ?  tirzepatide (MOUNJARO) 10 MG/0.5ML Pen, Inject 10 mg into the skin once a week. Start after 7.'5mg'$ , Disp: 6 mL, Rfl: 0 ?  tirzepatide (MOUNJARO) 5 MG/0.5ML Pen, Inject 5 mg into the skin once a week., Disp: 6 mL, Rfl: 0 ?  tirzepatide (MOUNJARO) 7.5 MG/0.5ML Pen, Inject 7.5 mg into the skin once a week. (Start after '5mg'$  complete), Disp: 6 mL, Rfl: 0 ?  TRUEPLUS INSULIN SYRINGE 30G X 5/16" 1 ML MISC, USE TO INJECT INSULIN 4 TIMES DAILY, Disp: 300 each, Rfl: 3 ?Social History  ? ?Socioeconomic History  ? Marital status: Married  ?  Spouse name: Yong Channel  ? Number of children: Not on file  ? Years of education: 61  ? Highest education level: Master's degree (e.g., MA, MS, MEng, MEd, MSW, MBA)  ?Occupational History  ? Occupation: third grade teacher  ?Tobacco Use  ? Smoking status: Never  ? Smokeless tobacco: Never  ?Vaping Use  ? Vaping Use: Never used  ?Substance and Sexual Activity  ? Alcohol use: Yes  ?  Comment: occasionally  ? Drug use: No  ? Sexual activity: Yes  ?Other Topics Concern  ? Not on file  ?Social History Narrative  ? Engaged to be married December 8 to [place]  ? ?Social Determinants of Health  ? ?Financial Resource Strain: Not on file  ?Food Insecurity: Not on file  ?Transportation Needs: Not on file  ?Physical Activity: Not on file  ?Stress: Not on file  ?Social Connections: Not on file  ?Intimate Partner Violence: Not on file  ? ?Family History  ?Problem Relation Age of Onset  ? Birth defects Mother   ?     one hand small  ? Arthritis Father   ? Diabetes Father   ? Heart disease Father 54  ?     heart attack  ? Psoriasis Father   ? Psoriasis Brother   ? Stroke Paternal  Grandmother   ?     age 83  ? Cancer Paternal Grandfather   ? ? ?Objective: ?Office vital signs reviewed. ?BP (!) 159/89   Pulse 100   Temp 98.2 ?F (36.8 ?C)   Ht '5\' 5"'$  (1.651 m)   Wt (!) 323 lb (146.5 kg)   SpO2 97%   BMI 53.75 kg/m?  ? ?Physical Examination:  ?General: Awake, alert, obese, No acute distress ?HEENT: MMM, sclera white, faced flushed ?Cardio: regular rate and rhythm, S1S2 heard, no  murmurs appreciated ?Pulm: clear to auscultation bilaterally, no wheezes, rhonchi or rales; normal work of breathing on room air ?GI: obese ? ?Assessment/ Plan: ?41 y.o. female  ? ?Hypertension associated with diabetes ([place]) ? ?Nausea - Plan: ondansetron (ZOFRAN-ODT) 4 MG disintegrating tablet ? ?Gastroesophageal reflux disease without esophagitis - Plan: pantoprazole (PROTONIX) 40 MG tablet ? ?Blood pressure does not come down even after recheck here.  However, her home blood pressures look to be well controlled.  I would like her to monitor ongoing at home.  For now she may continue lisinopril 10 mg and hydrochlorothiazide 12.5 mg.  If she continues to see blood pressures above 140/90 and I would like her to go ahead and advance that lisinopril 20 mg daily. ? 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  We discussed avoiding heavy/greasy foods.  I am going to advance her Protonix to 40 mg twice daily.  May continue using Pepcid as needed if needed.  Continue the 5 mg Mounjaro weekly as directed.  We will follow-up again in a few months for her normal diabetes checkup and to recheck her A1c. ? ? ?No orders of the defined types were placed in this encounter. ? ?No orders of the defined types were placed in this encounter. ? ? ? ?Janora Norlander, DO ?[place] ?([phone] ? ? ?

## 2021-08-05 ENCOUNTER — Encounter: Payer: Self-pay | Admitting: Family Medicine

## 2021-08-07 ENCOUNTER — Ambulatory Visit: Payer: 59 | Admitting: Family Medicine

## 2021-08-09 ENCOUNTER — Encounter: Payer: Self-pay | Admitting: Family Medicine

## 2021-08-10 ENCOUNTER — Other Ambulatory Visit (HOSPITAL_COMMUNITY): Payer: Self-pay

## 2021-08-10 ENCOUNTER — Other Ambulatory Visit: Payer: Self-pay | Admitting: Family Medicine

## 2021-08-10 DIAGNOSIS — E1159 Type 2 diabetes mellitus with other circulatory complications: Secondary | ICD-10-CM

## 2021-08-10 MED ORDER — LISINOPRIL 10 MG PO TABS
10.0000 mg | ORAL_TABLET | Freq: Every day | ORAL | 3 refills | Status: DC
  Filled 2021-08-10: qty 90, 90d supply, fill #0
  Filled 2021-10-25 – 2021-10-29 (×2): qty 90, 90d supply, fill #1
  Filled 2021-12-13 – 2022-02-01 (×3): qty 90, 90d supply, fill #2
  Filled 2022-02-09 – 2022-05-03 (×2): qty 90, 90d supply, fill #3

## 2021-08-14 ENCOUNTER — Other Ambulatory Visit (HOSPITAL_COMMUNITY): Payer: Self-pay

## 2021-08-14 ENCOUNTER — Other Ambulatory Visit: Payer: Self-pay

## 2021-08-14 MED ORDER — GLUCOSE BLOOD VI STRP
ORAL_STRIP | 3 refills | Status: DC
  Filled 2021-08-14: qty 300, 90d supply, fill #0
  Filled 2021-10-13 – 2021-10-29 (×3): qty 300, 90d supply, fill #1
  Filled 2022-02-09: qty 300, 90d supply, fill #2
  Filled 2022-04-14 – 2022-04-19 (×2): qty 300, 90d supply, fill #3
  Filled 2022-05-03: qty 200, 66d supply, fill #3

## 2021-08-14 MED ORDER — INSULIN NPH (HUMAN) (ISOPHANE) 100 UNIT/ML ~~LOC~~ SUSP
SUBCUTANEOUS | 5 refills | Status: DC
Start: 2021-08-14 — End: 2022-08-06
  Filled 2021-08-14: qty 10, 50d supply, fill #0

## 2021-08-14 MED ORDER — INSULIN REGULAR HUMAN 100 UNIT/ML IJ SOLN
INTRAMUSCULAR | 1 refills | Status: DC
  Filled 2021-08-14: qty 80, 90d supply, fill #0
  Filled 2021-11-17: qty 80, 90d supply, fill #1
  Filled 2022-02-11: qty 20, 22d supply, fill #2

## 2021-08-17 ENCOUNTER — Other Ambulatory Visit (HOSPITAL_COMMUNITY): Payer: Self-pay

## 2021-08-18 ENCOUNTER — Other Ambulatory Visit (HOSPITAL_COMMUNITY): Payer: Self-pay

## 2021-08-24 ENCOUNTER — Other Ambulatory Visit (HOSPITAL_COMMUNITY): Payer: Self-pay

## 2021-09-01 ENCOUNTER — Other Ambulatory Visit: Payer: Self-pay | Admitting: Family Medicine

## 2021-09-01 ENCOUNTER — Other Ambulatory Visit (HOSPITAL_COMMUNITY): Payer: Self-pay

## 2021-09-01 MED ORDER — METFORMIN HCL 1000 MG PO TABS
1000.0000 mg | ORAL_TABLET | Freq: Two times a day (BID) | ORAL | 0 refills | Status: DC
  Filled 2021-09-01: qty 180, 90d supply, fill #0

## 2021-09-02 ENCOUNTER — Other Ambulatory Visit (HOSPITAL_COMMUNITY): Payer: Self-pay

## 2021-09-03 ENCOUNTER — Other Ambulatory Visit (HOSPITAL_COMMUNITY): Payer: Self-pay

## 2021-09-15 ENCOUNTER — Ambulatory Visit: Payer: 59 | Admitting: Family Medicine

## 2021-09-15 DIAGNOSIS — H43823 Vitreomacular adhesion, bilateral: Secondary | ICD-10-CM | POA: Diagnosis not present

## 2021-09-15 DIAGNOSIS — H35373 Puckering of macula, bilateral: Secondary | ICD-10-CM | POA: Diagnosis not present

## 2021-09-15 DIAGNOSIS — H35033 Hypertensive retinopathy, bilateral: Secondary | ICD-10-CM | POA: Diagnosis not present

## 2021-09-15 DIAGNOSIS — E113513 Type 2 diabetes mellitus with proliferative diabetic retinopathy with macular edema, bilateral: Secondary | ICD-10-CM | POA: Diagnosis not present

## 2021-09-16 ENCOUNTER — Other Ambulatory Visit (HOSPITAL_COMMUNITY): Payer: Self-pay

## 2021-09-16 ENCOUNTER — Other Ambulatory Visit: Payer: Self-pay | Admitting: Family Medicine

## 2021-09-16 DIAGNOSIS — R609 Edema, unspecified: Secondary | ICD-10-CM

## 2021-09-16 DIAGNOSIS — I10 Essential (primary) hypertension: Secondary | ICD-10-CM

## 2021-09-16 MED ORDER — HYDROCHLOROTHIAZIDE 12.5 MG PO TABS
ORAL_TABLET | Freq: Every day | ORAL | 0 refills | Status: DC
  Filled 2021-09-16: qty 90, 90d supply, fill #0

## 2021-09-18 ENCOUNTER — Ambulatory Visit: Payer: 59 | Admitting: Family Medicine

## 2021-09-29 ENCOUNTER — Other Ambulatory Visit (HOSPITAL_COMMUNITY): Payer: Self-pay

## 2021-10-01 ENCOUNTER — Encounter: Payer: Self-pay | Admitting: Family Medicine

## 2021-10-05 ENCOUNTER — Other Ambulatory Visit: Payer: Self-pay | Admitting: Family Medicine

## 2021-10-08 ENCOUNTER — Other Ambulatory Visit: Payer: Self-pay | Admitting: Family Medicine

## 2021-10-08 ENCOUNTER — Other Ambulatory Visit (HOSPITAL_COMMUNITY): Payer: Self-pay

## 2021-10-08 MED ORDER — BUSPIRONE HCL 10 MG PO TABS
10.0000 mg | ORAL_TABLET | Freq: Two times a day (BID) | ORAL | 2 refills | Status: DC
  Filled 2021-10-08: qty 30, 15d supply, fill #0

## 2021-10-08 MED ORDER — BUSPIRONE HCL 10 MG PO TABS
ORAL_TABLET | Freq: Three times a day (TID) | ORAL | 0 refills | Status: DC
  Filled 2021-10-08: qty 270, 90d supply, fill #0

## 2021-10-09 ENCOUNTER — Other Ambulatory Visit (HOSPITAL_COMMUNITY): Payer: Self-pay

## 2021-10-09 ENCOUNTER — Other Ambulatory Visit: Payer: Self-pay | Admitting: Family Medicine

## 2021-10-12 DIAGNOSIS — H43823 Vitreomacular adhesion, bilateral: Secondary | ICD-10-CM | POA: Diagnosis not present

## 2021-10-12 DIAGNOSIS — E113513 Type 2 diabetes mellitus with proliferative diabetic retinopathy with macular edema, bilateral: Secondary | ICD-10-CM | POA: Diagnosis not present

## 2021-10-13 ENCOUNTER — Other Ambulatory Visit (HOSPITAL_COMMUNITY): Payer: Self-pay

## 2021-10-13 ENCOUNTER — Other Ambulatory Visit: Payer: Self-pay | Admitting: Family Medicine

## 2021-10-13 DIAGNOSIS — R110 Nausea: Secondary | ICD-10-CM

## 2021-10-14 ENCOUNTER — Other Ambulatory Visit (HOSPITAL_COMMUNITY): Payer: Self-pay

## 2021-10-14 MED ORDER — ONDANSETRON 4 MG PO TBDP
4.0000 mg | ORAL_TABLET | Freq: Three times a day (TID) | ORAL | 0 refills | Status: DC | PRN
  Filled 2021-10-14: qty 20, 7d supply, fill #0

## 2021-10-14 NOTE — Telephone Encounter (Signed)
Last office visit 07/27/21 Last refill 07/27/21, #20, no refills

## 2021-10-16 ENCOUNTER — Ambulatory Visit: Payer: 59 | Admitting: Podiatry

## 2021-10-25 ENCOUNTER — Other Ambulatory Visit: Payer: Self-pay | Admitting: Family Medicine

## 2021-10-25 ENCOUNTER — Other Ambulatory Visit (HOSPITAL_COMMUNITY): Payer: Self-pay

## 2021-10-25 DIAGNOSIS — R609 Edema, unspecified: Secondary | ICD-10-CM

## 2021-10-25 DIAGNOSIS — I10 Essential (primary) hypertension: Secondary | ICD-10-CM

## 2021-10-26 ENCOUNTER — Ambulatory Visit: Payer: 59 | Admitting: Podiatry

## 2021-10-26 ENCOUNTER — Encounter: Payer: Self-pay | Admitting: Family Medicine

## 2021-10-26 ENCOUNTER — Other Ambulatory Visit (HOSPITAL_COMMUNITY): Payer: Self-pay

## 2021-10-26 ENCOUNTER — Ambulatory Visit: Payer: 59 | Admitting: Family Medicine

## 2021-10-26 VITALS — BP 165/87 | HR 112 | Temp 97.1°F | Ht 65.0 in | Wt 308.4 lb

## 2021-10-26 DIAGNOSIS — I152 Hypertension secondary to endocrine disorders: Secondary | ICD-10-CM

## 2021-10-26 DIAGNOSIS — E1159 Type 2 diabetes mellitus with other circulatory complications: Secondary | ICD-10-CM | POA: Diagnosis not present

## 2021-10-26 DIAGNOSIS — E113513 Type 2 diabetes mellitus with proliferative diabetic retinopathy with macular edema, bilateral: Secondary | ICD-10-CM | POA: Diagnosis not present

## 2021-10-26 DIAGNOSIS — Z794 Long term (current) use of insulin: Secondary | ICD-10-CM | POA: Diagnosis not present

## 2021-10-26 DIAGNOSIS — L600 Ingrowing nail: Secondary | ICD-10-CM

## 2021-10-26 LAB — BAYER DCA HB A1C WAIVED: HB A1C (BAYER DCA - WAIVED): 7.8 % — ABNORMAL HIGH (ref 4.8–5.6)

## 2021-10-26 MED ORDER — HYDROCHLOROTHIAZIDE 12.5 MG PO TABS
ORAL_TABLET | Freq: Every day | ORAL | 0 refills | Status: DC
  Filled 2021-10-26: qty 90, fill #0
  Filled 2021-10-29 – 2021-12-13 (×2): qty 90, 90d supply, fill #0

## 2021-10-26 MED ORDER — METFORMIN HCL 1000 MG PO TABS
1000.0000 mg | ORAL_TABLET | Freq: Two times a day (BID) | ORAL | 0 refills | Status: DC
  Filled 2021-10-26 – 2021-11-17 (×2): qty 180, 90d supply, fill #0

## 2021-10-26 NOTE — Progress Notes (Signed)
Subjective: CC:DM PCP: Janora Norlander, DO JAS:[id number] [name] is a 41 y.o. female presenting to clinic today for:  1. Type 2 Diabetes with hypertension, hyperlipidemia:  Patient reports compliance with Mounjaro.  She just started the 7.5 mg weekly and is tolerating that without difficulty now.  She is not sure as to what happened whenever she did initially transition over to this, she thinks she may have had some type of food poisoning episode that she has had no real recurrence of any nausea, vomiting or abdominal pain since.  She has metformin 1000 mg twice daily on board and is only injecting mealtime insulin, Novolin R up to 30 units 3 times daily.  Has been off of the Humulin N for several weeks now.  She has been seeing blood sugars down into the low 100s which has been unheard of for quite some time.  She has had some successful weight loss with weight at 323 pounds last visit and she is down to 306 pounds on her scale.  She feels like she is experiencing less lower extremity edema and overall is just feeling more energized and better.  She has her follow-up with endocrinology in the next couple of days.  Last eye exam: Up-to-date Last foot exam: Up-to-date Last A1c:  Lab Results  Component Value Date   HGBA1C 9.2 (H) 06/02/2021   Nephropathy screen indicated?:  Up-to-date Last flu, zoster and/or pneumovax:  Immunization History  Administered Date(s) Administered   Influenza Inj Mdck Quad With Preservative 01/19/2018   Influenza Split 01/21/2016, 01/13/2018, 02/05/2019   Influenza-Unspecified 02/16/2006, 02/06/2014, 01/22/2017, 01/19/2018, 02/25/2020, 01/13/2021   PFIZER(Purple Top)SARS-COV-2 Vaccination 06/10/2019   PPD Test 02/16/2006   Pneumococcal Polysaccharide-23 04/13/2018    ROS: No chest pain.  No shortness of breath reported.  No dizziness reported.   ROS: Per HPI  Allergies  Allergen Reactions   Amoxicillin-Pot Clavulanate Anaphylaxis   Covid-19 Mrna  Vacc (Moderna) Anaphylaxis   Macrobid [Nitrofurantoin Macrocrystal] Swelling and Rash   Basaglar Kwikpen [Insulin Glargine] Swelling   Penicillins    Avelox [Moxifloxacin Hcl In Nacl] Rash   Past Medical History:  Diagnosis Date   Environmental allergies    GAD (generalized anxiety disorder) 12/05/2018   GERD (gastroesophageal reflux disease)    Hypertension    Type 2 diabetes mellitus (HCC)     Current Outpatient Medications:    Ascorbic Acid (VITAMIN C) 1000 MG tablet, Take 1,000 mg by mouth daily., Disp: , Rfl:    busPIRone (BUSPAR) 10 MG tablet, Take 1 tablet by mouth 3 (three) times daily., Disp: 270 tablet, Rfl: 0   cetirizine (ZYRTEC) 10 MG tablet, Take 10 mg by mouth daily., Disp: , Rfl:    Continuous Blood Gluc Receiver (DEXCOM G6 RECEIVER) DEVI, Use as directed, Disp: 1 each, Rfl: 0   Continuous Blood Gluc Sensor (DEXCOM G6 SENSOR) MISC, Change every 10 days, Disp: 9 each, Rfl: 3   Continuous Blood Gluc Transmit (DEXCOM G6 TRANSMITTER) MISC, Use as directed every 90 days, Disp: 1 each, Rfl: 3   dorzolamide-timolol (COSOPT) 22.3-6.8 MG/ML ophthalmic solution, Instill 1 drop in both eyes twice a day, Disp: 10 mL, Rfl: 5   EPINEPHrine 0.3 mg/0.3 mL IJ SOAJ injection, Inject 0.3 mLs (0.3 mg total) into the muscle as needed for anaphylaxis., Disp: 1 each, Rfl: 0   fluticasone (FLONASE) 50 MCG/ACT nasal spray, PLACE 1 SPRAY INTO BOTH NOSTRILS DAILY., Disp: 48 g, Rfl: 1   glucose blood test strip, USE TO CHECK  BLOOD SUGAR 3 TIMES DAILY, Disp: 300 strip, Rfl: 3   HUMULIN N 100 UNIT/ML injection, Inject 0.1 mLs (10 Units total) into the skin at bedtime. (Patient taking differently: Inject 20 Units into the skin at bedtime.), Disp: 30 mL, Rfl: 3   hydrochlorothiazide (HYDRODIURIL) 12.5 MG tablet, Take 1 tablet by mouth daily., Disp: 90 tablet, Rfl: 0   insulin NPH Human (HUMULIN N) 100 UNIT/ML injection, Inject 20 units into the skin at bedtime., Disp: 30 mL, Rfl: 5   insulin regular  (HUMULIN R) 100 units/mL injection, Inject 30 units under the skin at meals 3 times a day, Disp: 90 mL, Rfl: 1   Insulin Syringe-Needle U-100 (TECHLITE INSULIN SYRINGE) 31G X 15/64" 0.3 ML MISC, Use as directed 4 times a day to inject insulin, Disp: 400 each, Rfl: 3   Insulin Syringe-Needle U-100 31G X 5/16" 0.3 ML MISC, USE TO ADMINISTER INSULIN 4 TIMES DAILY AS DIRECTED, Disp: 400 each, Rfl: 3   Lancets (FREESTYLE) lancets, 1 each 3 (three) times daily., Disp: , Rfl:    latanoprost (XALATAN) 0.005 % ophthalmic solution, Instill 1 drop in both eyes at bedtime, Disp: 2.5 mL, Rfl: 0   latanoprost (XALATAN) 0.005 % ophthalmic solution, Instill 1 drop in both eyes at bedtime, Disp: 2.5 mL, Rfl: 0   lisinopril (ZESTRIL) 10 MG tablet, Take 1 tablet (10 mg total) by mouth daily., Disp: 90 tablet, Rfl: 3   metFORMIN (GLUCOPHAGE) 1000 MG tablet, Take 1 tablet by mouth 2  times daily with a meal., Disp: 180 tablet, Rfl: 0   NOVOFINE 32G X 6 MM MISC, , Disp: , Rfl:    ondansetron (ZOFRAN-ODT) 4 MG disintegrating tablet, Dissolve 1 tablet (4 mg total) by mouth every 8 (eight) hours as needed for nausea or vomiting., Disp: 20 tablet, Rfl: 0   pantoprazole (PROTONIX) 40 MG tablet, Take 1 tablet (40 mg total) by mouth 2 (two) times daily before a meal., Disp: 180 tablet, Rfl: 1   Prenatal Vit-Fe Fumarate-FA (PRENATAL VITAMIN PO), Take 1 tablet by mouth daily., Disp: , Rfl:    tirzepatide (MOUNJARO) 10 MG/0.5ML Pen, Inject 10 mg into the skin once a week. Start after 7.'5mg'$ , Disp: 6 mL, Rfl: 0   tirzepatide (MOUNJARO) 5 MG/0.5ML Pen, Inject 5 mg into the skin once a week., Disp: 6 mL, Rfl: 0   tirzepatide (MOUNJARO) 7.5 MG/0.5ML Pen, Inject 7.5 mg into the skin once a week. (Start after '5mg'$  complete), Disp: 6 mL, Rfl: 0   TRUEPLUS INSULIN SYRINGE 30G X 5/16" 1 ML MISC, USE TO INJECT INSULIN 4 TIMES DAILY, Disp: 300 each, Rfl: 3 Social History   Socioeconomic History   Marital status: Married    Spouse name:  Hunter   Number of children: Not on file   Years of education: 18   Highest education level: Master's degree (e.g., MA, MS, MEng, MEd, MSW, MBA)  Occupational History   Occupation: third grade teacher  Tobacco Use   Smoking status: Never   Smokeless tobacco: Never  Vaping Use   Vaping Use: Never used  Substance and Sexual Activity   Alcohol use: Yes    Comment: occasionally   Drug use: No   Sexual activity: Yes  Other Topics Concern   Not on file  Social History Narrative   Engaged to be married December 8 to [place] Determinants of Health   Financial Resource Strain: Low Risk  (02/15/2017)   Overall Financial Resource Strain ([place])  Difficulty of Paying Living Expenses: Not hard at all  Food Insecurity: No Food Insecurity (02/15/2017)   Hunger Vital Sign    Worried About Running Out of Food in the Last Year: Never true    Ran Out of Food in the Last Year: Never true  Transportation Needs: No Transportation Needs (02/15/2017)   PRAPARE - Hydrologist (Medical): No    Lack of Transportation (Non-Medical): No  Physical Activity: Sufficiently Active (02/15/2017)   Exercise Vital Sign    Days of Exercise per Week: 6 days    Minutes of Exercise per Session: 40 min  Stress: No Stress Concern Present (02/15/2017)   [place]    Feeling of Stress : Only a little  Social Connections: Moderately Integrated (02/15/2017)   Social Connection and Isolation Panel [NHANES]    Frequency of Communication with Friends and Family: More than three times a week    Frequency of Social Gatherings with Friends and Family: More than three times a week    Attends Religious Services: More than 4 times per year    Active Member of [employer] or Organizations: No    Attends Archivist Meetings: Never    Marital Status: Married  Human resources officer Violence: Not At Risk (02/15/2017)    Humiliation, Afraid, Rape, and Kick questionnaire    Fear of Current or Ex-Partner: No    Emotionally Abused: No    Physically Abused: No    Sexually Abused: No   Family History  Problem Relation Age of Onset   Birth defects Mother        one hand small   Arthritis Father    Diabetes Father    Heart disease Father 32       heart attack   Psoriasis Father    Psoriasis Brother    Stroke Paternal Grandmother        age 12   Cancer Paternal Grandfather     Objective: Office vital signs reviewed. BP (!) 165/87   Pulse (!) 112   Temp (!) 97.1 F (36.2 C)   Ht '5\' 5"'$  (1.651 m)   Wt (!) 308 lb 6.4 oz (139.9 kg)   SpO2 96%   BMI 51.32 kg/m   Physical Examination:  General: Awake, alert, morbidly obese, No acute distress HEENT: Sclera white.  Moist mucous membranes Cardio: Slightly tachycardic with regular rhythm, S1S2 heard, no murmurs appreciated Pulm: clear to auscultation bilaterally, no wheezes, rhonchi or rales; normal work of breathing on room air   Assessment/ Plan: 41 y.o. female   Type 2 diabetes mellitus with both eyes affected by proliferative retinopathy and macular edema, with long-term current use of insulin ([place])  Hypertension associated with diabetes ([place]) - Plan: Bayer DCA Hb A1c Waived  Morbid obesity ([place])  Sugar is getting better.  Her A1c is down from 9.2 last visit to 7.8 this visit.  She seems to be responding to the [facility] extremely well.  Continue 7.5 mg weekly for the next month or 2 and then may transition over to 10 mg weekly.  10 mg dose has already been preordered.  Her blood pressures are normotensive at home but always elevated here in office.  I would like her to bring a copy of her blood pressure readings from home as well as her home monitor so that we can compare it to ours.  No dose adjustments have been made to her HCTZ today  but we may need to strongly consider advancing dose if blood pressures are uncontrolled at home.  I am hopeful  that as she loses weight the blood pressures will also continue to normalize  She is down from 323 pounds to 208 pounds by our scale which is an excellent response to the GIP so far.  We will get her records faxed over to her endocrinologist in anticipation of their next visit at [phone].  Orders Placed This Encounter  Procedures   Bayer DCA Hb A1c Waived   No orders of the defined types were placed in this encounter.    Janora Norlander, DO [place] [phone]

## 2021-10-26 NOTE — Patient Instructions (Addendum)
Soak Instructions    THE DAY AFTER THE PROCEDURE  Place 1/4 cup of epsom salts in a quart of warm tap water.  Submerge your foot or feet with outer bandage intact for the initial soak; this will allow the bandage to become moist and wet for easy lift off.  Once you remove your bandage, continue to soak in the solution for 20 minutes.  This soak should be done twice a day.  Next, remove your foot or feet from solution, blot dry the affected area and cover.  You may use a band aid large enough to cover the area or use gauze and tape.  Apply other medications to the area as directed by the doctor such as polysporin neosporin.  IF YOUR SKIN BECOMES IRRITATED WHILE USING THESE INSTRUCTIONS, IT IS OKAY TO SWITCH TO  WHITE VINEGAR AND WATER. Or you may use antibacterial soap and water to keep the toe clean  Monitor for any signs/symptoms of infection. Call the office immediately if any occur or go directly to the emergency room. Call with any questions/concerns.  Diabetes Mellitus and Foot Care Foot care is an important part of your health, especially when you have diabetes. Diabetes may cause you to have problems because of poor blood flow (circulation) to your feet and legs, which can cause your skin to: Become thinner and drier. Break more easily. Heal more slowly. Peel and crack. You may also have nerve damage (neuropathy) in your legs and feet, causing decreased feeling in them. This means that you may not notice minor injuries to your feet that could lead to more serious problems. Noticing and addressing any potential problems early is the best way to prevent future foot problems. How to care for your feet Foot hygiene  Wash your feet daily with warm water and mild soap. Do not use hot water. Then, pat your feet and the areas between your toes until they are completely dry. Do not soak your feet as this can dry your skin. Trim your toenails straight across. Do not dig under them or around the  cuticle. File the edges of your nails with an emery board or nail file. Apply a moisturizing lotion or petroleum jelly to the skin on your feet and to dry, brittle toenails. Use lotion that does not contain alcohol and is unscented. Do not apply lotion between your toes. Shoes and socks Wear clean socks or stockings every day. Make sure they are not too tight. Do not wear knee-high stockings since they may decrease blood flow to your legs. Wear shoes that fit properly and have enough cushioning. Always look in your shoes before you put them on to be sure there are no objects inside. To break in new shoes, wear them for just a few hours a day. This prevents injuries on your feet. Wounds, scrapes, corns, and calluses  Check your feet daily for blisters, cuts, bruises, sores, and redness. If you cannot see the bottom of your feet, use a mirror or ask someone for help. Do not cut corns or calluses or try to remove them with medicine. If you find a minor scrape, cut, or break in the skin on your feet, keep it and the skin around it clean and dry. You may clean these areas with mild soap and water. Do not clean the area with peroxide, alcohol, or iodine. If you have a wound, scrape, corn, or callus on your foot, look at it several times a day to make sure it  is healing and not infected. Check for: Redness, swelling, or pain. Fluid or blood. Warmth. Pus or a bad smell. General tips Do not cross your legs. This may decrease blood flow to your feet. Do not use heating pads or hot water bottles on your feet. They may burn your skin. If you have lost feeling in your feet or legs, you may not know this is happening until it is too late. Protect your feet from hot and cold by wearing shoes, such as at the beach or on hot pavement. Schedule a complete foot exam at least once a year (annually) or more often if you have foot problems. Report any cuts, sores, or bruises to your health care provider  immediately. Where to find more information American Diabetes Association: www.diabetes.org Association of Diabetes Care & Education Specialists: www.diabeteseducator.org Contact a health care provider if: You have a medical condition that increases your risk of infection and you have any cuts, sores, or bruises on your feet. You have an injury that is not healing. You have redness on your legs or feet. You feel burning or tingling in your legs or feet. You have pain or cramps in your legs and feet. Your legs or feet are numb. Your feet always feel cold. You have pain around any toenails. Get help right away if: You have a wound, scrape, corn, or callus on your foot and: You have pain, swelling, or redness that gets worse. You have fluid or blood coming from the wound, scrape, corn, or callus. Your wound, scrape, corn, or callus feels warm to the touch. You have pus or a bad smell coming from the wound, scrape, corn, or callus. You have a fever. You have a red line going up your leg. Summary Check your feet every day for blisters, cuts, bruises, sores, and redness. Apply a moisturizing lotion or petroleum jelly to the skin on your feet and to dry, brittle toenails. Wear shoes that fit properly and have enough cushioning. If you have foot problems, report any cuts, sores, or bruises to your health care provider immediately. Schedule a complete foot exam at least once a year (annually) or more often if you have foot problems. This information is not intended to replace advice given to you by your health care provider. Make sure you discuss any questions you have with your health care provider. Document Revised: 10/11/2019 Document Reviewed: 10/11/2019 Elsevier Patient Education  [place]  An ingrown toenail occurs when the corner or sides of a toenail grow into the surrounding skin. This causes discomfort and pain. The big toe is most commonly affected,  but any of the toes can be affected. If an ingrown toenail is not treated, it can become infected. What are the causes? This condition may be caused by: Wearing shoes that are too small or tight. An injury, such as stubbing your toe or having your toe stepped on. Improper cutting or care of your toenails. Having nail or foot abnormalities that were present from birth (congenital abnormalities), such as having a nail that is too big for your toe. What increases the risk? The following factors may make you more likely to develop ingrown toenails: Age. Nails tend to get thicker with age, so ingrown nails are more common among older people. Cutting your toenails incorrectly, such as cutting them very short or cutting them unevenly. An ingrown toenail is more likely to get infected if you have: Diabetes. Blood flow (circulation) problems. What  are the signs or symptoms? Symptoms of an ingrown toenail may include: Pain, soreness, or tenderness. Redness. Swelling. Hardening of the skin that surrounds the toenail. Signs that an ingrown toenail may be infected include: Fluid or pus. Symptoms that get worse. How is this diagnosed? Ingrown toenails may be diagnosed based on: Your symptoms and medical history. A physical exam. Labs or tests. If you have fluid or blood coming from your toenail, a sample may be collected to test for the specific type of bacteria that is causing the infection. How is this treated? Treatment depends on the severity of your symptoms. You may be able to care for your toenail at home. If you have an infection, you may be prescribed antibiotic medicines. If you have fluid or pus draining from your toenail, your health care provider may drain it. If you have trouble walking, you may be given crutches to use. If you have a severe or infected ingrown toenail, you may need a procedure to remove part or all of the nail. Follow these instructions at home: [place]  your wound every day for signs of infection, or as often as told by your health care provider. Check for: More redness, swelling, or pain. More fluid or blood. Warmth. Pus or a bad smell. Do not pick at your toenail or try to remove it yourself. Soak your foot in warm, soapy water. Do this for 20 minutes, 3 times a day, or as often as told by your health care provider. This helps to keep your toe clean and your skin soft. Wear shoes that fit well and are not too tight. Your health care provider may recommend that you wear open-toed shoes while you heal. Trim your toenails regularly and carefully. Cut your toenails straight across to prevent injury to the skin at the corners of the toenail. Do not cut your nails in a curved shape. Keep your feet clean and dry to help prevent infection. General instructions Take over-the-counter and prescription medicines only as told by your health care provider. If you were prescribed an antibiotic, take it as told by your health care provider. Do not stop taking the antibiotic even if you start to feel better. If your health care provider told you to use crutches to help you move around, use them as instructed. Return to your normal activities as told by your health care provider. Ask your health care provider what activities are safe for you. Keep all follow-up visits. This is important. Contact a health care provider if: You have more redness, swelling, pain, or other symptoms that do not improve with treatment. You have fluid, blood, or pus coming from your toenail. You have a red streak on your skin that starts at your foot and spreads up your leg. You have a fever. Summary An ingrown toenail occurs when the corner or sides of a toenail grow into the surrounding skin. This causes discomfort and pain. The big toe is most commonly affected, but any of the toes can be affected. If an ingrown toenail is not treated, it can become infected. Fluid or pus  draining from your toenail is a sign of infection. Your health care provider may need to drain it. You may be given antibiotics to treat the infection. Trimming your toenails regularly and properly can help you prevent an ingrown toenail. This information is not intended to replace advice given to you by your health care provider. Make sure you discuss any questions you have with  your health care provider. Document Revised: 07/22/2020 Document Reviewed: 07/22/2020 Elsevier Patient Education  [place].

## 2021-10-26 NOTE — Progress Notes (Signed)
Subjective:   Patient ID: Amy Ball, female   DOB: 41 y.o.   MRN: [id number]   HPI Chief Complaint  Patient presents with   Diabetes    A1C-7.8 BG-140 Yesterday,  pt came in for a bilateral ingrown, started  ago, lateral sides of the hallux,  no pain     41 year old female with above complaints.  She states that over the last several months the ingrown toenails are getting worse. No redness, drainage, swelling. She states it hurts as the nails grow out if it were to pressure.    Review of Systems  All other systems reviewed and are negative.  Past Medical History:  Diagnosis Date   Environmental allergies    GAD (generalized anxiety disorder) 12/05/2018   GERD (gastroesophageal reflux disease)    Hypertension    Type 2 diabetes mellitus ([place])     Past Surgical History:  Procedure Laterality Date   FRACTURE SURGERY  2007   right ankle     Current Outpatient Medications:    Ascorbic Acid (VITAMIN C) 1000 MG tablet, Take 1,000 mg by mouth daily., Disp: , Rfl:    busPIRone (BUSPAR) 10 MG tablet, Take 1 tablet by mouth 3 (three) times daily., Disp: 270 tablet, Rfl: 0   cetirizine (ZYRTEC) 10 MG tablet, Take 10 mg by mouth daily., Disp: , Rfl:    Continuous Blood Gluc Receiver (DEXCOM G6 RECEIVER) DEVI, Use as directed, Disp: 1 each, Rfl: 0   Continuous Blood Gluc Sensor (DEXCOM G6 SENSOR) MISC, Change every 10 days, Disp: 9 each, Rfl: 3   Continuous Blood Gluc Transmit (DEXCOM G6 TRANSMITTER) MISC, Use as directed every 90 days, Disp: 1 each, Rfl: 3   dorzolamide-timolol (COSOPT) 22.3-6.8 MG/ML ophthalmic solution, Instill 1 drop in both eyes twice a day, Disp: 10 mL, Rfl: 5   EPINEPHrine 0.3 mg/0.3 mL IJ SOAJ injection, Inject 0.3 mLs (0.3 mg total) into the muscle as needed for anaphylaxis., Disp: 1 each, Rfl: 0   fluticasone (FLONASE) 50 MCG/ACT nasal spray, PLACE 1 SPRAY INTO BOTH NOSTRILS DAILY., Disp: 48 g, Rfl: 1   glucose blood test strip, USE TO CHECK BLOOD SUGAR  3 TIMES DAILY, Disp: 300 strip, Rfl: 3   HUMULIN N 100 UNIT/ML injection, Inject 0.1 mLs (10 Units total) into the skin at bedtime. (Patient taking differently: Inject 20 Units into the skin at bedtime.), Disp: 30 mL, Rfl: 3   hydrochlorothiazide (HYDRODIURIL) 12.5 MG tablet, Take 1 tablet by mouth daily., Disp: 90 tablet, Rfl: 0   insulin NPH Human (HUMULIN N) 100 UNIT/ML injection, Inject 20 units into the skin at bedtime., Disp: 30 mL, Rfl: 5   insulin regular (HUMULIN R) 100 units/mL injection, Inject 30 units under the skin at meals 3 times a day, Disp: 90 mL, Rfl: 1   Insulin Syringe-Needle U-100 (TECHLITE INSULIN SYRINGE) 31G X 15/64" 0.3 ML MISC, Use as directed 4 times a day to inject insulin, Disp: 400 each, Rfl: 3   Insulin Syringe-Needle U-100 31G X 5/16" 0.3 ML MISC, USE TO ADMINISTER INSULIN 4 TIMES DAILY AS DIRECTED, Disp: 400 each, Rfl: 3   Lancets (FREESTYLE) lancets, 1 each 3 (three) times daily., Disp: , Rfl:    latanoprost (XALATAN) 0.005 % ophthalmic solution, Instill 1 drop in both eyes at bedtime, Disp: 2.5 mL, Rfl: 0   latanoprost (XALATAN) 0.005 % ophthalmic solution, Instill 1 drop in both eyes at bedtime, Disp: 2.5 mL, Rfl: 0   lisinopril (ZESTRIL) 10 MG tablet,  Take 1 tablet (10 mg total) by mouth daily., Disp: 90 tablet, Rfl: 3   metFORMIN (GLUCOPHAGE) 1000 MG tablet, Take 1 tablet by mouth 2  times daily with a meal., Disp: 180 tablet, Rfl: 0   NOVOFINE 32G X 6 MM MISC, , Disp: , Rfl:    ondansetron (ZOFRAN-ODT) 4 MG disintegrating tablet, Dissolve 1 tablet (4 mg total) by mouth every 8 (eight) hours as needed for nausea or vomiting., Disp: 20 tablet, Rfl: 0   pantoprazole (PROTONIX) 40 MG tablet, Take 1 tablet (40 mg total) by mouth 2 (two) times daily before a meal., Disp: 180 tablet, Rfl: 1   Prenatal Vit-Fe Fumarate-FA (PRENATAL VITAMIN PO), Take 1 tablet by mouth daily., Disp: , Rfl:    tirzepatide (MOUNJARO) 10 MG/0.5ML Pen, Inject 10 mg into the skin once a week.  Start after 7.'5mg'$ , Disp: 6 mL, Rfl: 0   tirzepatide (MOUNJARO) 5 MG/0.5ML Pen, Inject 5 mg into the skin once a week., Disp: 6 mL, Rfl: 0   tirzepatide (MOUNJARO) 7.5 MG/0.5ML Pen, Inject 7.5 mg into the skin once a week. (Start after '5mg'$  complete), Disp: 6 mL, Rfl: 0   TRUEPLUS INSULIN SYRINGE 30G X 5/16" 1 ML MISC, USE TO INJECT INSULIN 4 TIMES DAILY, Disp: 300 each, Rfl: 3  Allergies  Allergen Reactions   Amoxicillin-Pot Clavulanate Anaphylaxis   Covid-19 Mrna Vacc (Moderna) Anaphylaxis   Macrobid [Nitrofurantoin Macrocrystal] Swelling and Rash   Basaglar Kwikpen [Insulin Glargine] Swelling   Penicillins    Avelox [Moxifloxacin Hcl In Nacl] Rash           Objective:  Physical Exam  General: AAO x3, NAD  Dermatological: Incurvation present to the bilateral hallux nails on both nail borders worsening medial.  There is trace edema along the nail borders likely from inflammation.  No erythema or warmth.  No drainage or pus.  No fluctuance or crepitation.  Vascular: Dorsalis Pedis artery and Posterior Tibial artery pedal pulses are 2/4 bilateral with immedate capillary fill time.  There is no pain with calf compression, swelling, warmth, erythema.   Neruologic: Grossly intact via light touch bilateral.   Musculoskeletal: No gross boney pedal deformities bilateral. No pain, crepitus, or limitation noted with foot and ankle range of motion bilateral. Muscular strength 5/5 in all groups tested bilateral.  Gait: Unassisted, Nonantalgic.       Assessment:   Ingrown toenails     Plan:  -Treatment options discussed including all alternatives, risks, and complications. -Etiology of symptoms were discussed- Discussed partial nail avulsions however she also decided to hold off on this today.  Discussed management techniques as well as Epsom salt soaks and antibiotic ointment dressings.  She symptoms persist would need to the procedure.  She is going to monitor this.  Trula Slade DPM

## 2021-10-27 ENCOUNTER — Other Ambulatory Visit (HOSPITAL_COMMUNITY): Payer: Self-pay

## 2021-10-27 MED ORDER — LATANOPROST 0.005 % OP SOLN
OPHTHALMIC | 0 refills | Status: DC
  Filled 2021-10-27: qty 2.5, 20d supply, fill #0

## 2021-10-29 ENCOUNTER — Other Ambulatory Visit (HOSPITAL_COMMUNITY): Payer: Self-pay

## 2021-11-02 DIAGNOSIS — E1165 Type 2 diabetes mellitus with hyperglycemia: Secondary | ICD-10-CM | POA: Diagnosis not present

## 2021-11-02 DIAGNOSIS — E113513 Type 2 diabetes mellitus with proliferative diabetic retinopathy with macular edema, bilateral: Secondary | ICD-10-CM | POA: Diagnosis not present

## 2021-11-02 DIAGNOSIS — K760 Fatty (change of) liver, not elsewhere classified: Secondary | ICD-10-CM | POA: Diagnosis not present

## 2021-11-02 DIAGNOSIS — D3502 Benign neoplasm of left adrenal gland: Secondary | ICD-10-CM | POA: Diagnosis not present

## 2021-11-04 ENCOUNTER — Other Ambulatory Visit (HOSPITAL_COMMUNITY): Payer: Self-pay

## 2021-11-11 DIAGNOSIS — Z0142 Encounter for cervical smear to confirm findings of recent normal smear following initial abnormal smear: Secondary | ICD-10-CM | POA: Diagnosis not present

## 2021-11-11 DIAGNOSIS — Z01411 Encounter for gynecological examination (general) (routine) with abnormal findings: Secondary | ICD-10-CM | POA: Diagnosis not present

## 2021-11-11 DIAGNOSIS — Z124 Encounter for screening for malignant neoplasm of cervix: Secondary | ICD-10-CM | POA: Diagnosis not present

## 2021-11-11 DIAGNOSIS — N914 Secondary oligomenorrhea: Secondary | ICD-10-CM | POA: Diagnosis not present

## 2021-11-11 DIAGNOSIS — Z113 Encounter for screening for infections with a predominantly sexual mode of transmission: Secondary | ICD-10-CM | POA: Diagnosis not present

## 2021-11-11 DIAGNOSIS — Z6841 Body Mass Index (BMI) 40.0 and over, adult: Secondary | ICD-10-CM | POA: Diagnosis not present

## 2021-11-11 DIAGNOSIS — Z1231 Encounter for screening mammogram for malignant neoplasm of breast: Secondary | ICD-10-CM | POA: Diagnosis not present

## 2021-11-11 DIAGNOSIS — Z01419 Encounter for gynecological examination (general) (routine) without abnormal findings: Secondary | ICD-10-CM | POA: Diagnosis not present

## 2021-11-17 ENCOUNTER — Other Ambulatory Visit (HOSPITAL_COMMUNITY): Payer: Self-pay

## 2021-11-18 ENCOUNTER — Other Ambulatory Visit (HOSPITAL_COMMUNITY): Payer: Self-pay

## 2021-11-18 MED ORDER — "TECHLITE INSULIN SYRINGE 31G X 15/64"" 0.3 ML MISC"
3 refills | Status: AC
  Filled 2021-11-18: qty 400, 90d supply, fill #0
  Filled 2022-08-01: qty 400, 90d supply, fill #1
  Filled 2022-10-05 – 2022-10-30 (×3): qty 400, 90d supply, fill #2

## 2021-11-19 ENCOUNTER — Other Ambulatory Visit (HOSPITAL_COMMUNITY): Payer: Self-pay

## 2021-11-20 ENCOUNTER — Other Ambulatory Visit (HOSPITAL_COMMUNITY): Payer: Self-pay

## 2021-11-27 DIAGNOSIS — H43823 Vitreomacular adhesion, bilateral: Secondary | ICD-10-CM | POA: Diagnosis not present

## 2021-11-27 DIAGNOSIS — E113513 Type 2 diabetes mellitus with proliferative diabetic retinopathy with macular edema, bilateral: Secondary | ICD-10-CM | POA: Diagnosis not present

## 2021-11-28 ENCOUNTER — Other Ambulatory Visit (HOSPITAL_COMMUNITY): Payer: Self-pay

## 2021-11-30 ENCOUNTER — Other Ambulatory Visit (HOSPITAL_COMMUNITY): Payer: Self-pay

## 2021-11-30 MED ORDER — "TECHLITE INSULIN SYRINGE 31G X 15/64"" 0.3 ML MISC"
4 refills | Status: DC
  Filled 2021-11-30 – 2022-02-09 (×2): qty 400, 90d supply, fill #0
  Filled 2022-05-03: qty 300, 75d supply, fill #1
  Filled 2022-10-06 – 2022-10-12 (×2): qty 300, 75d supply, fill #2

## 2021-12-10 ENCOUNTER — Other Ambulatory Visit (HOSPITAL_COMMUNITY): Payer: Self-pay

## 2021-12-13 ENCOUNTER — Other Ambulatory Visit (HOSPITAL_COMMUNITY): Payer: Self-pay

## 2021-12-13 ENCOUNTER — Other Ambulatory Visit: Payer: Self-pay | Admitting: Family Medicine

## 2021-12-13 DIAGNOSIS — R110 Nausea: Secondary | ICD-10-CM

## 2021-12-14 ENCOUNTER — Other Ambulatory Visit (HOSPITAL_COMMUNITY): Payer: Self-pay

## 2021-12-15 ENCOUNTER — Other Ambulatory Visit (HOSPITAL_COMMUNITY): Payer: Self-pay

## 2021-12-17 ENCOUNTER — Other Ambulatory Visit (HOSPITAL_COMMUNITY): Payer: Self-pay

## 2021-12-17 MED ORDER — LATANOPROST 0.005 % OP SOLN
OPHTHALMIC | 0 refills | Status: DC
  Filled 2021-12-17: qty 2.5, 25d supply, fill #0

## 2021-12-17 MED ORDER — LATANOPROST 0.005 % OP SOLN
OPHTHALMIC | 0 refills | Status: DC
  Filled 2022-01-18: qty 2.5, 25d supply, fill #0

## 2021-12-18 ENCOUNTER — Other Ambulatory Visit (HOSPITAL_COMMUNITY): Payer: Self-pay

## 2021-12-22 ENCOUNTER — Other Ambulatory Visit: Payer: Self-pay | Admitting: Family Medicine

## 2021-12-22 ENCOUNTER — Other Ambulatory Visit (HOSPITAL_COMMUNITY): Payer: Self-pay

## 2021-12-22 DIAGNOSIS — R110 Nausea: Secondary | ICD-10-CM

## 2021-12-23 ENCOUNTER — Other Ambulatory Visit (HOSPITAL_COMMUNITY): Payer: Self-pay

## 2021-12-23 MED ORDER — ONDANSETRON 4 MG PO TBDP
4.0000 mg | ORAL_TABLET | Freq: Three times a day (TID) | ORAL | 1 refills | Status: DC | PRN
  Filled 2021-12-23: qty 20, 7d supply, fill #0
  Filled 2022-01-18: qty 20, 7d supply, fill #1

## 2021-12-25 ENCOUNTER — Other Ambulatory Visit (HOSPITAL_COMMUNITY): Payer: Self-pay

## 2021-12-25 ENCOUNTER — Encounter: Payer: Self-pay | Admitting: Family Medicine

## 2021-12-25 MED ORDER — DORZOLAMIDE HCL-TIMOLOL MAL 2-0.5 % OP SOLN
1.0000 [drp] | Freq: Two times a day (BID) | OPHTHALMIC | 3 refills | Status: DC
  Filled 2021-12-25: qty 10, 100d supply, fill #0
  Filled 2022-01-18: qty 10, 50d supply, fill #0
  Filled 2022-02-27: qty 10, 50d supply, fill #1
  Filled 2022-04-14: qty 10, 50d supply, fill #2

## 2021-12-26 ENCOUNTER — Other Ambulatory Visit (HOSPITAL_COMMUNITY): Payer: Self-pay

## 2022-01-03 ENCOUNTER — Other Ambulatory Visit: Payer: Self-pay | Admitting: Family Medicine

## 2022-01-04 ENCOUNTER — Other Ambulatory Visit (HOSPITAL_COMMUNITY): Payer: Self-pay

## 2022-01-04 MED ORDER — BUSPIRONE HCL 10 MG PO TABS
10.0000 mg | ORAL_TABLET | Freq: Three times a day (TID) | ORAL | 2 refills | Status: DC
  Filled 2022-01-04: qty 270, 90d supply, fill #0

## 2022-01-14 ENCOUNTER — Telehealth: Payer: Self-pay | Admitting: Family Medicine

## 2022-01-14 NOTE — Telephone Encounter (Signed)
Pt rescheduled for the 27

## 2022-01-14 NOTE — Telephone Encounter (Signed)
Pt wants to know if she can be worked in to be seen on 10/27 instead of on 10/25 because they have an early release at school on 10/27.  Please advise and call patient.

## 2022-01-18 ENCOUNTER — Ambulatory Visit: Payer: 59 | Admitting: Family Medicine

## 2022-01-18 ENCOUNTER — Other Ambulatory Visit (HOSPITAL_COMMUNITY): Payer: Self-pay

## 2022-01-18 DIAGNOSIS — K529 Noninfective gastroenteritis and colitis, unspecified: Secondary | ICD-10-CM | POA: Diagnosis not present

## 2022-01-18 NOTE — Progress Notes (Signed)
Telephone visit  Subjective: NG:[id number]  PCP: Janora Norlander, DO VOZ:[id number] H Erway is a 41 y.o. female calls for telephone consult today. Patient provides verbal consent for consult held via phone.  Due to COVID-19 pandemic this visit was conducted virtually. This visit type was conducted due to national recommendations for restrictions regarding the COVID-19 Pandemic (e.g. social distancing, sheltering in place) in an effort to limit this patient's exposure and mitigate transmission in our community. All issues noted in this document were discussed and addressed.  A physical exam was not performed with this format.   Location of patient: car Location of provider: [facility] Others present for call: none  1. GI issues Patient reports late last week, she developed seasonal drainage and that is improving.  She took her Mounjaro 10 mg shot on Wednesday.  She had to leave school because she had nausea and vomiting.  She reports sulfa burps that preceded it.  Saturday she felt better.  Sunday it was back, she was no longer have nausea but was having diarrhea. Today she has been eating very light and bland in efforts not to upset her stomach. No fevers, myalgia.  She wonders if she caught something at school.    ROS: Per HPI  Allergies  Allergen Reactions   Amoxicillin-Pot Clavulanate Anaphylaxis   Covid-19 Mrna Vacc (Moderna) Anaphylaxis   Macrobid [Nitrofurantoin Macrocrystal] Swelling and Rash   Basaglar Kwikpen [Insulin Glargine] Swelling   Penicillins    Avelox [Moxifloxacin Hcl In Nacl] Rash   Past Medical History:  Diagnosis Date   Environmental allergies    GAD (generalized anxiety disorder) 12/05/2018   GERD (gastroesophageal reflux disease)    Hypertension    Type 2 diabetes mellitus ([facility])     Current Outpatient Medications:    ondansetron (ZOFRAN-ODT) 4 MG disintegrating tablet, Dissolve 1 tablet (4 mg total) by mouth every 8 (eight) hours as needed for nausea or  vomiting., Disp: 20 tablet, Rfl: 1   Ascorbic Acid (VITAMIN C) 1000 MG tablet, Take 1,000 mg by mouth daily., Disp: , Rfl:    busPIRone (BUSPAR) 10 MG tablet, Take 1 tablet (10 mg total) by mouth 3 (three) times daily., Disp: 90 tablet, Rfl: 2   cetirizine (ZYRTEC) 10 MG tablet, Take 10 mg by mouth daily., Disp: , Rfl:    Continuous Blood Gluc Receiver ([place]) DEVI, Use as directed, Disp: 1 each, Rfl: 0   Continuous Blood Gluc Sensor (DEXCOM G6 SENSOR) MISC, Change every 10 days, Disp: 9 each, Rfl: 3   Continuous Blood Gluc Transmit (DEXCOM G6 TRANSMITTER) MISC, Use as directed every 90 days, Disp: 1 each, Rfl: 3   dorzolamide-timolol (COSOPT) 22.3-6.8 MG/ML ophthalmic solution, Place 1 drop into both eyes 2 (two) times daily., Disp: 10 mL, Rfl: 3   EPINEPHrine 0.3 mg/0.3 mL IJ SOAJ injection, Inject 0.3 mLs (0.3 mg total) into the muscle as needed for anaphylaxis., Disp: 1 each, Rfl: 0   fluticasone (FLONASE) 50 MCG/ACT nasal spray, PLACE 1 SPRAY INTO BOTH NOSTRILS DAILY., Disp: 48 g, Rfl: 1   glucose blood test strip, USE TO CHECK BLOOD SUGAR 3 TIMES DAILY, Disp: 300 strip, Rfl: 3   HUMULIN N 100 UNIT/ML injection, Inject 0.1 mLs (10 Units total) into the skin at bedtime. (Patient taking differently: Inject 20 Units into the skin at bedtime.), Disp: 30 mL, Rfl: 3   hydrochlorothiazide (HYDRODIURIL) 12.5 MG tablet, Take 1 tablet by mouth daily., Disp: 90 tablet, Rfl: 0   insulin NPH Human (  HUMULIN N) 100 UNIT/ML injection, Inject 20 units into the skin at bedtime., Disp: 30 mL, Rfl: 5   insulin regular (HUMULIN R) 100 units/mL injection, Inject 30 units under the skin at meals 3 times a day, Disp: 90 mL, Rfl: 1   Insulin Syringe-Needle U-100 (TECHLITE INSULIN SYRINGE) 31G X 15/64" 0.3 ML MISC, Use as directed 4 times a day to inject insulin, Disp: 400 each, Rfl: 3   Insulin Syringe-Needle U-100 (TECHLITE INSULIN SYRINGE) 31G X 15/64" 0.3 ML MISC, Use to inject insulin 4 times a day as  directed, Disp: 400 each, Rfl: 4   Insulin Syringe-Needle U-100 31G X 5/16" 0.3 ML MISC, USE TO ADMINISTER INSULIN 4 TIMES DAILY AS DIRECTED, Disp: 400 each, Rfl: 3   Lancets (FREESTYLE) lancets, 1 each 3 (three) times daily., Disp: , Rfl:    latanoprost (XALATAN) 0.005 % ophthalmic solution, Place 1 drop in both eyes at bedtime, Disp: 2.5 mL, Rfl: 0   latanoprost (XALATAN) 0.005 % ophthalmic solution, Place 1 drop in both eyes at bedtime, Disp: 2.5 mL, Rfl: 0   lisinopril (ZESTRIL) 10 MG tablet, Take 1 tablet (10 mg total) by mouth daily., Disp: 90 tablet, Rfl: 3   metFORMIN (GLUCOPHAGE) 1000 MG tablet, Take 1 tablet by mouth 2  times daily with a meal., Disp: 180 tablet, Rfl: 0   NOVOFINE 32G X 6 MM MISC, , Disp: , Rfl:    pantoprazole (PROTONIX) 40 MG tablet, Take 1 tablet (40 mg total) by mouth 2 (two) times daily before a meal., Disp: 180 tablet, Rfl: 1   Prenatal Vit-Fe Fumarate-FA (PRENATAL VITAMIN PO), Take 1 tablet by mouth daily., Disp: , Rfl:    tirzepatide (MOUNJARO) 10 MG/0.5ML Pen, Inject 10 mg into the skin once a week. Start after 7.'5mg'$ , Disp: 6 mL, Rfl: 0   TRUEPLUS INSULIN SYRINGE 30G X 5/16" 1 ML MISC, USE TO INJECT INSULIN 4 TIMES DAILY, Disp: 300 each, Rfl: 3  Assessment/ Plan: 41 y.o. female   Gastroenteritis  Likely viral.  Doubt medication induced at this point.  Encouraged PO hydration.  Add probiotic.  Follow up prn  Start time: 4:02pm End time: 4:09pm  Total time spent on patient care (including telephone call/ virtual visit): 7 minutes  [place], [place] [phone]

## 2022-01-19 ENCOUNTER — Other Ambulatory Visit (HOSPITAL_COMMUNITY): Payer: Self-pay

## 2022-01-20 ENCOUNTER — Other Ambulatory Visit (HOSPITAL_COMMUNITY): Payer: Self-pay

## 2022-01-20 DIAGNOSIS — N979 Female infertility, unspecified: Secondary | ICD-10-CM | POA: Diagnosis not present

## 2022-01-20 DIAGNOSIS — N970 Female infertility associated with anovulation: Secondary | ICD-10-CM | POA: Diagnosis not present

## 2022-01-20 DIAGNOSIS — Z319 Encounter for procreative management, unspecified: Secondary | ICD-10-CM | POA: Diagnosis not present

## 2022-01-26 ENCOUNTER — Ambulatory Visit: Payer: 59 | Admitting: Family Medicine

## 2022-01-27 ENCOUNTER — Ambulatory Visit: Payer: 59 | Admitting: Family Medicine

## 2022-01-29 ENCOUNTER — Other Ambulatory Visit (HOSPITAL_COMMUNITY): Payer: Self-pay

## 2022-01-29 ENCOUNTER — Encounter: Payer: Self-pay | Admitting: Family Medicine

## 2022-01-29 ENCOUNTER — Ambulatory Visit: Payer: 59 | Admitting: Family Medicine

## 2022-01-29 VITALS — BP 144/80 | HR 105 | Temp 97.4°F | Ht 65.0 in | Wt 293.8 lb

## 2022-01-29 DIAGNOSIS — E113513 Type 2 diabetes mellitus with proliferative diabetic retinopathy with macular edema, bilateral: Secondary | ICD-10-CM

## 2022-01-29 DIAGNOSIS — I152 Hypertension secondary to endocrine disorders: Secondary | ICD-10-CM | POA: Diagnosis not present

## 2022-01-29 DIAGNOSIS — Z794 Long term (current) use of insulin: Secondary | ICD-10-CM | POA: Diagnosis not present

## 2022-01-29 DIAGNOSIS — F411 Generalized anxiety disorder: Secondary | ICD-10-CM | POA: Diagnosis not present

## 2022-01-29 DIAGNOSIS — E1159 Type 2 diabetes mellitus with other circulatory complications: Secondary | ICD-10-CM

## 2022-01-29 LAB — BAYER DCA HB A1C WAIVED: HB A1C (BAYER DCA - WAIVED): 8 % — ABNORMAL HIGH (ref 4.8–5.6)

## 2022-01-29 MED ORDER — TIRZEPATIDE 12.5 MG/0.5ML ~~LOC~~ SOAJ
12.5000 mg | SUBCUTANEOUS | 1 refills | Status: DC
  Filled 2022-01-29: qty 2, 28d supply, fill #0
  Filled 2022-03-21: qty 2, 28d supply, fill #1
  Filled 2022-04-11 – 2022-04-14 (×3): qty 2, 28d supply, fill #2

## 2022-01-29 MED ORDER — HYDROXYZINE PAMOATE 25 MG PO CAPS
25.0000 mg | ORAL_CAPSULE | Freq: Three times a day (TID) | ORAL | 1 refills | Status: DC | PRN
  Filled 2022-01-29: qty 30, 10d supply, fill #0
  Filled 2022-02-09: qty 30, 10d supply, fill #1

## 2022-01-29 NOTE — Progress Notes (Signed)
Subjective: CC:DM PCP: Amy Norlander, DO XFG:[id number] H Bacus is a 41 y.o. female presenting to clinic today for:  1. Type 2 Diabetes with hypertension, hyperlipidemia:  Patient reports compliance with her medications.  She felt that her blood sugar may have been a little bit more controlled on the 7-1/2 mg that it has been on the 10 mg.  She continues to maintain weight loss.  Does occasionally have nausea with vomiting but the most recent episode seem to be precipitated by GI illness.  She has Zofran on hand if needed however.  She does report that there is been some mild site reactions since she has been on the 10 mg.  She uses alcohol swabs both before and after injection.  Her blood sugar this morning was 92 and she did not feel symptomatic but she did feel compelled to go ahead and drink soda just in case.  Last eye exam: UTD Last foot exam: UTD Last A1c:  Lab Results  Component Value Date   HGBA1C 7.8 (H) 10/26/2021   Nephropathy screen indicated?: UTD Last flu, zoster and/or pneumovax:  Immunization History  Administered Date(s) Administered   Influenza Inj Mdck Quad With Preservative 01/19/2018   Influenza Split 01/21/2016, 01/13/2018, 02/05/2019   Influenza,inj,Quad PF,6+ Mos 01/25/2022   Influenza-Unspecified 02/16/2006, 02/06/2014, 01/22/2017, 01/19/2018, 02/25/2020, 01/13/2021, 01/17/2021   PFIZER(Purple Top)SARS-COV-2 Vaccination 06/10/2019   PPD Test 02/16/2006   Pneumococcal Polysaccharide-23 04/13/2018   2. GAD with panic She had a panic attack when she had the N/V related to the GI bug a few months ago.  Her spouse has urged her to discuss this today.  She feels the buspar to be very helpful but admits to some breakthrough pain attacks  ROS: Per HPI  Allergies  Allergen Reactions   Amoxicillin-Pot Clavulanate Anaphylaxis   Covid-19 (Mrna) Vaccine Anaphylaxis   Covid-19 Mrna Vacc (Moderna) Anaphylaxis   Macrobid [Nitrofurantoin Macrocrystal] Swelling  and Rash   Basaglar Kwikpen [Insulin Glargine] Swelling   Penicillins    Avelox [Moxifloxacin Hcl In Nacl] Rash   Past Medical History:  Diagnosis Date   Environmental allergies    GAD (generalized anxiety disorder) 12/05/2018   GERD (gastroesophageal reflux disease)    Hypertension    Type 2 diabetes mellitus ([facility])     Current Outpatient Medications:    Ascorbic Acid (VITAMIN C) 1000 MG tablet, Take 1,000 mg by mouth daily., Disp: , Rfl:    busPIRone (BUSPAR) 10 MG tablet, Take 1 tablet (10 mg total) by mouth 3 (three) times daily., Disp: 90 tablet, Rfl: 2   cetirizine (ZYRTEC) 10 MG tablet, Take 10 mg by mouth daily., Disp: , Rfl:    dorzolamide-timolol (COSOPT) 2-0.5 % ophthalmic solution, Place 1 drop into both eyes 2 (two) times daily., Disp: 10 mL, Rfl: 3   EPINEPHrine 0.3 mg/0.3 mL IJ SOAJ injection, Inject 0.3 mLs (0.3 mg total) into the muscle as needed for anaphylaxis., Disp: 1 each, Rfl: 0   fluticasone (FLONASE) 50 MCG/ACT nasal spray, PLACE 1 SPRAY INTO BOTH NOSTRILS DAILY., Disp: 48 g, Rfl: 1   glucose blood test strip, USE TO CHECK BLOOD SUGAR 3 TIMES DAILY, Disp: 300 strip, Rfl: 3   HUMULIN N 100 UNIT/ML injection, Inject 0.1 mLs (10 Units total) into the skin at bedtime. (Patient taking differently: Inject 20 Units into the skin at bedtime.), Disp: 30 mL, Rfl: 3   hydrochlorothiazide (HYDRODIURIL) 12.5 MG tablet, Take 1 tablet by mouth daily., Disp: 90 tablet, Rfl: 0  insulin regular (HUMULIN R) 100 units/mL injection, Inject 30 units under the skin at meals 3 times a day (Patient taking differently: 25 mLs.), Disp: 90 mL, Rfl: 1   Insulin Syringe-Needle U-100 (TECHLITE INSULIN SYRINGE) 31G X 15/64" 0.3 ML MISC, Use as directed 4 times a day to inject insulin, Disp: 400 each, Rfl: 3   Insulin Syringe-Needle U-100 (TECHLITE INSULIN SYRINGE) 31G X 15/64" 0.3 ML MISC, Use to inject insulin 4 times a day as directed, Disp: 400 each, Rfl: 4   Insulin Syringe-Needle U-100 31G X  5/16" 0.3 ML MISC, USE TO ADMINISTER INSULIN 4 TIMES DAILY AS DIRECTED, Disp: 400 each, Rfl: 3   Lancets (FREESTYLE) lancets, 1 each 3 (three) times daily., Disp: , Rfl:    latanoprost (XALATAN) 0.005 % ophthalmic solution, Place 1 drop in both eyes at bedtime, Disp: 2.5 mL, Rfl: 0   latanoprost (XALATAN) 0.005 % ophthalmic solution, Place 1 drop in both eyes at bedtime, Disp: 2.5 mL, Rfl: 0   lisinopril (ZESTRIL) 10 MG tablet, Take 1 tablet (10 mg total) by mouth daily., Disp: 90 tablet, Rfl: 3   metFORMIN (GLUCOPHAGE) 1000 MG tablet, Take 1 tablet by mouth 2  times daily with a meal., Disp: 180 tablet, Rfl: 0   NOVOFINE 32G X 6 MM MISC, , Disp: , Rfl:    ondansetron (ZOFRAN-ODT) 4 MG disintegrating tablet, Dissolve 1 tablet (4 mg total) by mouth every 8 (eight) hours as needed for nausea or vomiting., Disp: 20 tablet, Rfl: 1   pantoprazole (PROTONIX) 40 MG tablet, Take 1 tablet (40 mg total) by mouth 2 (two) times daily before a meal., Disp: 180 tablet, Rfl: 1   Prenatal Vit-Fe Fumarate-FA (PRENATAL VITAMIN PO), Take 1 tablet by mouth daily., Disp: , Rfl:    tirzepatide (MOUNJARO) 10 MG/0.5ML Pen, Inject 10 mg into the skin once a week. Start after 7.'5mg'$ , Disp: 6 mL, Rfl: 0   TRUEPLUS INSULIN SYRINGE 30G X 5/16" 1 ML MISC, USE TO INJECT INSULIN 4 TIMES DAILY, Disp: 300 each, Rfl: 3   Continuous Blood Gluc Receiver ([place]) DEVI, Use as directed (Patient not taking: Reported on 01/29/2022), Disp: 1 each, Rfl: 0   Continuous Blood Gluc Sensor (DEXCOM G6 SENSOR) MISC, Change every 10 days (Patient not taking: Reported on 01/29/2022), Disp: 9 each, Rfl: 3   Continuous Blood Gluc Transmit (DEXCOM G6 TRANSMITTER) MISC, Use as directed every 90 days (Patient not taking: Reported on 01/29/2022), Disp: 1 each, Rfl: 3   insulin NPH Human (HUMULIN N) 100 UNIT/ML injection, Inject 20 units into the skin at bedtime. (Patient not taking: Reported on 01/29/2022), Disp: 30 mL, Rfl: 5 Social History    Socioeconomic History   Marital status: Married    Spouse name: Hunter   Number of children: Not on file   Years of education: 18   Highest education level: Master's degree (e.g., MA, MS, MEng, MEd, MSW, MBA)  Occupational History   Occupation: third grade teacher  Tobacco Use   Smoking status: Never   Smokeless tobacco: Never  Vaping Use   Vaping Use: Never used  Substance and Sexual Activity   Alcohol use: Yes    Comment: occasionally   Drug use: No   Sexual activity: Yes  Other Topics Concern   Not on file  Social History Narrative   Engaged to be married December 8 to Rachel Determinants of Health   Financial Resource Strain: Low Risk  (02/15/2017)   Overall Financial  Resource Strain (CARDIA)    Difficulty of Paying Living Expenses: Not hard at all  Food Insecurity: No Food Insecurity (02/15/2017)   Hunger Vital Sign    Worried About Running Out of Food in the Last Year: Never true    Ran Out of Food in the Last Year: Never true  Transportation Needs: No Transportation Needs (02/15/2017)   PRAPARE - Hydrologist (Medical): No    Lack of Transportation (Non-Medical): No  Physical Activity: Sufficiently Active (02/15/2017)   Exercise Vital Sign    Days of Exercise per Week: 6 days    Minutes of Exercise per Session: 40 min  Stress: No Stress Concern Present (02/15/2017)   [place]    Feeling of Stress : Only a little  Social Connections: Moderately Integrated (02/15/2017)   Social Connection and Isolation Panel [NHANES]    Frequency of Communication with Friends and Family: More than three times a week    Frequency of Social Gatherings with Friends and Family: More than three times a week    Attends Religious Services: More than 4 times per year    Active Member of [employer] or Organizations: No    Attends Archivist Meetings: Never    Marital Status:  Married  Human resources officer Violence: Not At Risk (02/15/2017)   Humiliation, Afraid, Rape, and Kick questionnaire    Fear of Current or Ex-Partner: No    Emotionally Abused: No    Physically Abused: No    Sexually Abused: No   Family History  Problem Relation Age of Onset   Birth defects Mother        one hand small   Arthritis Father    Diabetes Father    Heart disease Father 48       heart attack   Psoriasis Father    Psoriasis Brother    Stroke Paternal Grandmother        age 80   Cancer Paternal Grandfather     Objective: Office vital signs reviewed. BP (!) 151/89   Pulse (!) 105   Temp (!) 97.4 F (36.3 C) (Temporal)   Ht '5\' 5"'$  (1.651 m)   Wt 293 lb 12.8 oz (133.3 kg)   LMP 01/15/2022   SpO2 98%   BMI 48.89 kg/m   Physical Examination:  General: Awake, alert, well nourished, No acute distress HEENT: sclera white, MMM Cardio: regular rate and rhythm, S1S2 heard, no murmurs appreciated Pulm: clear to auscultation bilaterally, no wheezes, rhonchi or rales; normal work of breathing on room air Extremities: warm, well perfused, No edema, cyanosis or clubbing; +2 pulses bilaterally MSK: normal gait and station Skin : Mild hyperemia appreciated along the left lower quadrant of the abdomen that is less than half dollar in size.  No induration, exudates or warmth Psych: Mood stable, speech normal, affect appropriate     01/29/2022    9:57 AM 10/26/2021   11:39 AM 07/27/2021    3:13 PM  Depression screen PHQ 2/9  Decreased Interest 0 0 0  Down, Depressed, Hopeless 0 0 0  PHQ - 2 Score 0 0 0  Altered sleeping 0  1  Tired, decreased energy 0  0  Change in appetite 0  0  Feeling bad or failure about yourself  0  0  Trouble concentrating 0  0  Moving slowly or fidgety/restless 0  0  Suicidal thoughts 0  0  PHQ-9 Score 0  1  Difficult doing work/chores Not difficult at all  Not difficult at all     Assessment/ Plan: 41 y.o. female   Type 2 diabetes mellitus  with both eyes affected by proliferative retinopathy and macular edema, with long-term current use of insulin ([place]) - Plan: Microalbumin / creatinine urine ratio, Bayer DCA Hb A1c Waived, tirzepatide (MOUNJARO) 12.5 MG/0.5ML Pen  Hypertension associated with diabetes ([place])  Morbid obesity ([place])  GAD (generalized anxiety disorder) - Plan: hydrOXYzine (VISTARIL) 25 MG capsule   Diabetes does not show control and A1c has gone up slightly to 8.0.  I have advanced her Mounjaro to 12.5 mg weekly.  We discussed avoidance of wiping the area with alcohol prior to injection and in fact I would prefer her to shower and then take the injection.  I do question if she is having some localized irritation secondary to either backflow of the medication onto the skin or not allowing that alcohol to dry down completely before injecting.  Blood pressure is borderline but she has a component of whitecoat hypertension which has been evident on home blood pressure checks previously  Vistaril added for as needed use.  May use for panic attack or can even use for allergy if this localized site reaction turns to be more of a true allergy to the medication.   Orders Placed This Encounter  Procedures   Microalbumin / creatinine urine ratio   Bayer DCA Hb A1c Waived   No orders of the defined types were placed in this encounter.    Amy Norlander, DO [place] [phone]

## 2022-01-31 LAB — MICROALBUMIN / CREATININE URINE RATIO
Creatinine, Urine: 27 mg/dL
Microalb/Creat Ratio: 265 mg/g creat — ABNORMAL HIGH (ref 0–29)
Microalbumin, Urine: 71.5 ug/mL

## 2022-02-01 ENCOUNTER — Other Ambulatory Visit (HOSPITAL_COMMUNITY): Payer: Self-pay

## 2022-02-01 ENCOUNTER — Encounter: Payer: Self-pay | Admitting: Family Medicine

## 2022-02-04 ENCOUNTER — Encounter: Payer: Self-pay | Admitting: Family Medicine

## 2022-02-08 DIAGNOSIS — H43823 Vitreomacular adhesion, bilateral: Secondary | ICD-10-CM | POA: Diagnosis not present

## 2022-02-08 DIAGNOSIS — E113513 Type 2 diabetes mellitus with proliferative diabetic retinopathy with macular edema, bilateral: Secondary | ICD-10-CM | POA: Diagnosis not present

## 2022-02-08 DIAGNOSIS — H35373 Puckering of macula, bilateral: Secondary | ICD-10-CM | POA: Diagnosis not present

## 2022-02-09 ENCOUNTER — Other Ambulatory Visit (HOSPITAL_COMMUNITY): Payer: Self-pay

## 2022-02-09 ENCOUNTER — Other Ambulatory Visit: Payer: Self-pay | Admitting: Family Medicine

## 2022-02-09 DIAGNOSIS — K219 Gastro-esophageal reflux disease without esophagitis: Secondary | ICD-10-CM

## 2022-02-09 DIAGNOSIS — R609 Edema, unspecified: Secondary | ICD-10-CM

## 2022-02-09 DIAGNOSIS — I10 Essential (primary) hypertension: Secondary | ICD-10-CM

## 2022-02-09 MED ORDER — BUSPIRONE HCL 10 MG PO TABS
10.0000 mg | ORAL_TABLET | Freq: Three times a day (TID) | ORAL | 2 refills | Status: DC
  Filled 2022-02-09 – 2022-04-02 (×2): qty 90, 30d supply, fill #0
  Filled 2022-05-03: qty 90, 30d supply, fill #1
  Filled 2022-05-26: qty 90, 30d supply, fill #2

## 2022-02-09 MED ORDER — HYDROCHLOROTHIAZIDE 12.5 MG PO TABS
12.5000 mg | ORAL_TABLET | Freq: Every day | ORAL | 0 refills | Status: DC
  Filled 2022-02-09: qty 90, fill #0
  Filled 2022-03-21: qty 90, 90d supply, fill #0

## 2022-02-09 MED ORDER — PANTOPRAZOLE SODIUM 40 MG PO TBEC
40.0000 mg | DELAYED_RELEASE_TABLET | Freq: Two times a day (BID) | ORAL | 0 refills | Status: DC
  Filled 2022-02-09: qty 180, 90d supply, fill #0

## 2022-02-09 MED ORDER — METFORMIN HCL 1000 MG PO TABS
1000.0000 mg | ORAL_TABLET | Freq: Two times a day (BID) | ORAL | 0 refills | Status: DC
  Filled 2022-02-09: qty 180, 90d supply, fill #0

## 2022-02-10 ENCOUNTER — Other Ambulatory Visit (HOSPITAL_COMMUNITY): Payer: Self-pay

## 2022-02-12 ENCOUNTER — Other Ambulatory Visit (HOSPITAL_COMMUNITY): Payer: Self-pay

## 2022-02-12 MED ORDER — INSULIN REGULAR HUMAN 100 UNIT/ML IJ SOLN
30.0000 [IU] | Freq: Three times a day (TID) | INTRAMUSCULAR | 0 refills | Status: DC
  Filled 2022-02-12: qty 80, 89d supply, fill #0
  Filled 2022-05-06: qty 10, 11d supply, fill #1

## 2022-02-14 ENCOUNTER — Encounter (INDEPENDENT_AMBULATORY_CARE_PROVIDER_SITE_OTHER): Payer: 59 | Admitting: Family Medicine

## 2022-02-14 DIAGNOSIS — J019 Acute sinusitis, unspecified: Secondary | ICD-10-CM | POA: Diagnosis not present

## 2022-02-14 DIAGNOSIS — B9689 Other specified bacterial agents as the cause of diseases classified elsewhere: Secondary | ICD-10-CM | POA: Diagnosis not present

## 2022-02-15 ENCOUNTER — Other Ambulatory Visit (HOSPITAL_COMMUNITY): Payer: Self-pay

## 2022-02-16 ENCOUNTER — Other Ambulatory Visit (HOSPITAL_COMMUNITY): Payer: Self-pay

## 2022-02-16 MED ORDER — AZITHROMYCIN 250 MG PO TABS: ORAL_TABLET | ORAL | 0 refills | Status: DC

## 2022-02-16 NOTE — Telephone Encounter (Signed)

## 2022-02-27 ENCOUNTER — Other Ambulatory Visit (HOSPITAL_COMMUNITY): Payer: Self-pay

## 2022-03-07 ENCOUNTER — Other Ambulatory Visit (HOSPITAL_COMMUNITY): Payer: Self-pay

## 2022-03-10 ENCOUNTER — Other Ambulatory Visit (HOSPITAL_COMMUNITY): Payer: Self-pay

## 2022-03-10 MED ORDER — LATANOPROST 0.005 % OP SOLN
1.0000 [drp] | Freq: Every day | OPHTHALMIC | 0 refills | Status: DC
  Filled 2022-03-10: qty 2.5, 25d supply, fill #0

## 2022-03-19 ENCOUNTER — Other Ambulatory Visit (HOSPITAL_COMMUNITY): Payer: Self-pay

## 2022-03-19 MED ORDER — LATANOPROST 0.005 % OP SOLN
1.0000 [drp] | Freq: Every evening | OPHTHALMIC | 0 refills | Status: DC
  Filled 2022-03-19: qty 2.5, 50d supply, fill #0
  Filled 2022-03-26: qty 2.5, 25d supply, fill #0

## 2022-03-22 ENCOUNTER — Other Ambulatory Visit: Payer: Self-pay

## 2022-03-22 ENCOUNTER — Encounter: Payer: Self-pay | Admitting: Family Medicine

## 2022-03-24 ENCOUNTER — Encounter: Payer: Self-pay | Admitting: Family Medicine

## 2022-03-27 ENCOUNTER — Other Ambulatory Visit: Payer: Self-pay

## 2022-03-30 ENCOUNTER — Other Ambulatory Visit (HOSPITAL_COMMUNITY): Payer: Self-pay

## 2022-04-02 ENCOUNTER — Other Ambulatory Visit (HOSPITAL_COMMUNITY): Payer: Self-pay

## 2022-04-02 ENCOUNTER — Other Ambulatory Visit: Payer: Self-pay

## 2022-04-11 ENCOUNTER — Other Ambulatory Visit: Payer: Self-pay | Admitting: Family Medicine

## 2022-04-11 DIAGNOSIS — R110 Nausea: Secondary | ICD-10-CM

## 2022-04-12 ENCOUNTER — Other Ambulatory Visit: Payer: Self-pay

## 2022-04-12 ENCOUNTER — Other Ambulatory Visit (HOSPITAL_COMMUNITY): Payer: Self-pay

## 2022-04-12 MED ORDER — ONDANSETRON 4 MG PO TBDP
4.0000 mg | ORAL_TABLET | Freq: Three times a day (TID) | ORAL | 0 refills | Status: DC | PRN
  Filled 2022-04-12: qty 20, 7d supply, fill #0

## 2022-04-14 ENCOUNTER — Telehealth (INDEPENDENT_AMBULATORY_CARE_PROVIDER_SITE_OTHER): Payer: Commercial Managed Care - PPO | Admitting: Family Medicine

## 2022-04-14 ENCOUNTER — Other Ambulatory Visit: Payer: Self-pay

## 2022-04-14 ENCOUNTER — Other Ambulatory Visit (HOSPITAL_COMMUNITY): Payer: Self-pay

## 2022-04-14 ENCOUNTER — Encounter: Payer: Self-pay | Admitting: Family Medicine

## 2022-04-14 DIAGNOSIS — R197 Diarrhea, unspecified: Secondary | ICD-10-CM

## 2022-04-14 NOTE — Progress Notes (Signed)
MyChart Video visit  Subjective: CC: abdominal pain PCP: Janora Norlander, DO QQP:Amy Ball H Miracle is a 42 y.o. female. Patient provides verbal consent for consult held via video.  Due to COVID-19 pandemic this visit was conducted virtually. This visit type was conducted due to national recommendations for restrictions regarding the COVID-19 Pandemic (e.g. social distancing, sheltering in place) in an effort to limit this patient's exposure and mitigate transmission in our community. All issues noted in this document were discussed and addressed.  A physical exam was not performed with this format.   Location of patient: home Location of provider: [facility] Others present for call: none  1. Gi issues She reports yesterday she had several bouts of diarrhea within minutes of eating.  She reports that today her left side was hurting.  That has subsided but she had diarrhea again after eating.  She started having a headache and she started feeling poorly.  She is trying to hydrate well. No nausea, vomiting or fever.  No blood in stool. No dysuria or hematuria.   ROS: Per HPI  Allergies  Allergen Reactions   Amoxicillin-Pot Clavulanate Anaphylaxis   Covid-19 (Mrna) Vaccine Anaphylaxis   Covid-19 Mrna Vacc (Moderna) Anaphylaxis   Macrobid [Nitrofurantoin Macrocrystal] Swelling and Rash   Basaglar Kwikpen [Insulin Glargine] Swelling   Penicillins    Avelox [Moxifloxacin Hcl In Nacl] Rash   Past Medical History:  Diagnosis Date   Environmental allergies    GAD (generalized anxiety disorder) 12/05/2018   GERD (gastroesophageal reflux disease)    Hypertension    Type 2 diabetes mellitus (HCC)     Current Outpatient Medications:    Ascorbic Acid (VITAMIN C) 1000 MG tablet, Take 1,000 mg by mouth daily., Disp: , Rfl:    azithromycin (ZITHROMAX) 250 MG tablet, Take 2 tablets today, then take 1 tablet daily until gone., Disp: 6 tablet, Rfl: 0   busPIRone (BUSPAR) 10 MG tablet, Take 1 tablet  (10 mg total) by mouth 3 (three) times daily., Disp: 90 tablet, Rfl: 2   cetirizine (ZYRTEC) 10 MG tablet, Take 10 mg by mouth daily., Disp: , Rfl:    Continuous Blood Gluc Receiver ([place]) DEVI, Use as directed (Patient not taking: Reported on 01/29/2022), Disp: 1 each, Rfl: 0   Continuous Blood Gluc Sensor (DEXCOM G6 SENSOR) MISC, Change every 10 days (Patient not taking: Reported on 01/29/2022), Disp: 9 each, Rfl: 3   Continuous Blood Gluc Transmit (DEXCOM G6 TRANSMITTER) MISC, Use as directed every 90 days (Patient not taking: Reported on 01/29/2022), Disp: 1 each, Rfl: 3   dorzolamide-timolol (COSOPT) 2-0.5 % ophthalmic solution, Place 1 drop into both eyes 2 (two) times daily., Disp: 10 mL, Rfl: 3   EPINEPHrine 0.3 mg/0.3 mL IJ SOAJ injection, Inject 0.3 mLs (0.3 mg total) into the muscle as needed for anaphylaxis., Disp: 1 each, Rfl: 0   fluticasone (FLONASE) 50 MCG/ACT nasal spray, PLACE 1 SPRAY INTO BOTH NOSTRILS DAILY., Disp: 48 g, Rfl: 1   glucose blood test strip, USE TO CHECK BLOOD SUGAR 3 TIMES DAILY, Disp: 300 strip, Rfl: 3   HUMULIN N 100 UNIT/ML injection, Inject 0.1 mLs (10 Units total) into the skin at bedtime. (Patient taking differently: Inject 20 Units into the skin at bedtime.), Disp: 30 mL, Rfl: 3   hydrochlorothiazide (HYDRODIURIL) 12.5 MG tablet, Take 1 tablet (12.5 mg total) by mouth daily., Disp: 90 tablet, Rfl: 0   hydrOXYzine (VISTARIL) 25 MG capsule, Take 1 capsule (25 mg total) by mouth  every 8 (eight) hours as needed for anxiety or itching., Disp: 30 capsule, Rfl: 1   insulin NPH Human (HUMULIN N) 100 UNIT/ML injection, Inject 20 units into the skin at bedtime. (Patient not taking: Reported on 01/29/2022), Disp: 30 mL, Rfl: 5   insulin regular (HUMULIN R) 100 units/mL injection, Inject 0.3 mLs (30 Units total) into the skin 3 (three) times daily., Disp: 90 mL, Rfl: 0   Insulin Syringe-Needle U-100 (TECHLITE INSULIN SYRINGE) 31G X 15/64" 0.3 ML MISC, Use as  directed 4 times a day to inject insulin, Disp: 400 each, Rfl: 3   Insulin Syringe-Needle U-100 (TECHLITE INSULIN SYRINGE) 31G X 15/64" 0.3 ML MISC, Use to inject insulin 4 times a day as directed, Disp: 400 each, Rfl: 4   Insulin Syringe-Needle U-100 31G X 5/16" 0.3 ML MISC, USE TO ADMINISTER INSULIN 4 TIMES DAILY AS DIRECTED, Disp: 400 each, Rfl: 3   Lancets (FREESTYLE) lancets, 1 each 3 (three) times daily., Disp: , Rfl:    latanoprost (XALATAN) 0.005 % ophthalmic solution, Place 1 drop in both eyes at bedtime, Disp: 2.5 mL, Rfl: 0   latanoprost (XALATAN) 0.005 % ophthalmic solution, Place 1 drop in both eyes at bedtime, Disp: 2.5 mL, Rfl: 0   latanoprost (XALATAN) 0.005 % ophthalmic solution, Place 1 drop into both eyes at night., Disp: 2.5 mL, Rfl: 0   lisinopril (ZESTRIL) 10 MG tablet, Take 1 tablet (10 mg total) by mouth daily., Disp: 90 tablet, Rfl: 3   metFORMIN (GLUCOPHAGE) 1000 MG tablet, Take 1 tablet by mouth 2  times daily with a meal., Disp: 180 tablet, Rfl: 0   NOVOFINE 32G X 6 MM MISC, , Disp: , Rfl:    ondansetron (ZOFRAN-ODT) 4 MG disintegrating tablet, Dissolve 1 tablet (4 mg total) by mouth every 8 (eight) hours as needed for nausea or vomiting., Disp: 20 tablet, Rfl: 0   pantoprazole (PROTONIX) 40 MG tablet, Take 1 tablet (40 mg total) by mouth 2 (two) times daily before a meal., Disp: 180 tablet, Rfl: 0   Prenatal Vit-Fe Fumarate-FA (PRENATAL VITAMIN PO), Take 1 tablet by mouth daily., Disp: , Rfl:    tirzepatide (MOUNJARO) 12.5 MG/0.5ML Pen, Inject 12.5 mg into the skin once a week., Disp: 6 mL, Rfl: 1   TRUEPLUS INSULIN SYRINGE 30G X 5/16" 1 ML MISC, USE TO INJECT INSULIN 4 TIMES DAILY, Disp: 300 each, Rfl: 3  Gen: nontoxic, well appearing female, NAD HEENT: MMM, sclera white  Assessment/ Plan: 42 y.o. female   Diarrhea of presumed infectious origin  We discussed home care regimen.  Hydrate with electrolytes.  Test for COVID-19 given constellation of headache and  diarrhea.  She presented like this previously.  We discussed potential for diverticulitis and I gave her information on this.  If no significant improvement over the next couple of days with hydration, bland food and bowel rest, will consider empiric treatment with Cipro Flagyl.  Start time: 4:15pm (text sent), 4:17pm (second text sent) End time: 4:28pm  Total time spent on patient care (including video visit/ documentation): 11 minutes  [place], [place] [phone]

## 2022-04-14 NOTE — Patient Instructions (Signed)
Diverticulitis  Diverticulitis is infection or inflammation of small pouches (diverticula) in the colon that form due to a condition called diverticulosis. Diverticula can trap stool (feces) and bacteria, causing infection and inflammation. Diverticulitis may cause severe stomach pain and diarrhea. It may lead to tissue damage in the colon that causes bleeding or blockage. The diverticula may also burst (rupture) and cause infected stool to enter other areas of the abdomen. What are the causes? This condition is caused by stool becoming trapped in the diverticula, which allows bacteria to grow in the diverticula. This leads to inflammation and infection. What increases the risk? You are more likely to develop this condition if you have diverticulosis. The risk increases if you: Are overweight or obese. Do not get enough exercise. Drink alcohol. Use tobacco products. Eat a diet that has a lot of red meat such as beef, pork, or lamb. Eat a diet that does not include enough fiber. High-fiber foods include fruits, vegetables, beans, nuts, and whole grains. Are over 40 years of age. What are the signs or symptoms? Symptoms of this condition may include: Pain and tenderness in the abdomen. The pain is normally located on the left side of the abdomen, but it may occur in other areas. Fever and chills. Nausea. Vomiting. Cramping. Bloating. Changes in bowel routines. Blood in your stool. How is this diagnosed? This condition is diagnosed based on: Your medical history. A physical exam. Tests to make sure there is nothing else causing your condition. These tests may include: Blood tests. Urine tests. CT scan of the abdomen. How is this treated? Most cases of this condition are mild and can be treated at home. Treatment may include: Taking over-the-counter pain medicines. Following a clear liquid diet. Taking antibiotic medicines by mouth. Resting. More severe cases may need to be treated  at a hospital. Treatment may include: Not eating or drinking. Taking prescription pain medicine. Receiving antibiotic medicines through an IV. Receiving fluids and nutrition through an IV. Surgery. When your condition is under control, your health care provider may recommend that you have a colonoscopy. This is an exam to look at the entire large intestine. During the exam, a lubricated, bendable tube is inserted into the anus and then passed into the rectum, colon, and other parts of the large intestine. A colonoscopy can show how severe your diverticula are and whether something else may be causing your symptoms. Follow these instructions at home: Medicines Take over-the-counter and prescription medicines only as told by your health care provider. These include fiber supplements, probiotics, and stool softeners. If you were prescribed an antibiotic medicine, take it as told by your health care provider. Do not stop taking the antibiotic even if you start to feel better. Ask your health care provider if the medicine prescribed to you requires you to avoid driving or using machinery. Eating and drinking  Follow a full liquid diet or another diet as directed by your health care provider. After your symptoms improve, your health care provider may tell you to change your diet. He or she may recommend that you eat a diet that contains at least 25 grams (25 g) of fiber daily. Fiber makes it easier to pass stool. Healthy sources of fiber include: Berries. One cup contains 4-8 grams of fiber. Beans or lentils. One-half cup contains 5-8 grams of fiber. Green vegetables. One cup contains 4 grams of fiber. Avoid eating red meat. General instructions Do not use any products that contain nicotine or tobacco, such as   cigarettes, e-cigarettes, and chewing tobacco. If you need help quitting, ask your health care provider. Exercise for at least 30 minutes, 3 times each week. You should exercise hard enough to  raise your heart rate and break a sweat. Keep all follow-up visits as told by your health care provider. This is important. You may need to have a colonoscopy. Contact a health care provider if: Your pain does not improve. Your bowel movements do not return to normal. Get help right away if: Your pain gets worse. Your symptoms do not get better with treatment. Your symptoms suddenly get worse. You have a fever. You vomit more than one time. You have stools that are bloody, black, or tarry. Summary Diverticulitis is infection or inflammation of small pouches (diverticula) in the colon that form due to a condition called diverticulosis. Diverticula can trap stool (feces) and bacteria, causing infection and inflammation. You are at higher risk for this condition if you have diverticulosis and you eat a diet that does not include enough fiber. Most cases of this condition are mild and can be treated at home. More severe cases may need to be treated at a hospital. When your condition is under control, your health care provider may recommend that you have an exam called a colonoscopy. This exam can show how severe your diverticula are and whether something else may be causing your symptoms. Keep all follow-up visits as told by your health care provider. This is important. This information is not intended to replace advice given to you by your health care provider. Make sure you discuss any questions you have with your health care provider. Document Revised: 01/01/2019 Document Reviewed: 01/01/2019 Elsevier Patient Education  2023 Elsevier Inc.  

## 2022-04-15 ENCOUNTER — Other Ambulatory Visit (HOSPITAL_COMMUNITY): Payer: Self-pay

## 2022-04-15 ENCOUNTER — Other Ambulatory Visit: Payer: Self-pay

## 2022-04-19 ENCOUNTER — Other Ambulatory Visit (HOSPITAL_COMMUNITY): Payer: Self-pay

## 2022-05-03 ENCOUNTER — Other Ambulatory Visit: Payer: Self-pay | Admitting: Family Medicine

## 2022-05-03 ENCOUNTER — Other Ambulatory Visit (HOSPITAL_COMMUNITY): Payer: Self-pay

## 2022-05-03 ENCOUNTER — Encounter: Payer: Self-pay | Admitting: Family Medicine

## 2022-05-03 ENCOUNTER — Ambulatory Visit (INDEPENDENT_AMBULATORY_CARE_PROVIDER_SITE_OTHER): Payer: Commercial Managed Care - PPO | Admitting: Family Medicine

## 2022-05-03 VITALS — BP 154/96 | HR 98 | Temp 98.7°F | Ht 65.0 in | Wt 282.0 lb

## 2022-05-03 DIAGNOSIS — I152 Hypertension secondary to endocrine disorders: Secondary | ICD-10-CM | POA: Diagnosis not present

## 2022-05-03 DIAGNOSIS — E1159 Type 2 diabetes mellitus with other circulatory complications: Secondary | ICD-10-CM | POA: Diagnosis not present

## 2022-05-03 DIAGNOSIS — R609 Edema, unspecified: Secondary | ICD-10-CM

## 2022-05-03 DIAGNOSIS — I10 Essential (primary) hypertension: Secondary | ICD-10-CM

## 2022-05-03 DIAGNOSIS — Z794 Long term (current) use of insulin: Secondary | ICD-10-CM | POA: Diagnosis not present

## 2022-05-03 DIAGNOSIS — K219 Gastro-esophageal reflux disease without esophagitis: Secondary | ICD-10-CM

## 2022-05-03 DIAGNOSIS — E113513 Type 2 diabetes mellitus with proliferative diabetic retinopathy with macular edema, bilateral: Secondary | ICD-10-CM | POA: Diagnosis not present

## 2022-05-03 DIAGNOSIS — R110 Nausea: Secondary | ICD-10-CM

## 2022-05-03 MED ORDER — TIRZEPATIDE 15 MG/0.5ML ~~LOC~~ SOAJ
15.0000 mg | SUBCUTANEOUS | 4 refills | Status: DC
  Filled 2022-05-03: qty 6, 84d supply, fill #0
  Filled 2022-05-07: qty 2, 28d supply, fill #0
  Filled 2022-06-02: qty 2, 28d supply, fill #1
  Filled 2022-06-27 – 2022-09-20 (×8): qty 2, 28d supply, fill #2
  Filled 2022-10-24 – 2022-10-30 (×2): qty 2, 28d supply, fill #3
  Filled 2022-11-23: qty 2, 28d supply, fill #4
  Filled 2022-12-14 – 2022-12-27 (×2): qty 2, 28d supply, fill #5
  Filled 2023-01-26: qty 2, 28d supply, fill #6
  Filled 2023-02-09 – 2023-02-18 (×2): qty 2, 28d supply, fill #7
  Filled 2023-03-06 – 2023-03-20 (×2): qty 2, 28d supply, fill #8
  Filled 2023-04-11: qty 2, 28d supply, fill #9
  Filled 2023-04-26: qty 6, 84d supply, fill #9

## 2022-05-03 NOTE — Progress Notes (Signed)
Subjective: CC:DM PCP: Janora Norlander, DO NOM:[facility]NOBS [name] is a 42 y.o. female presenting to clinic today for:  1. Type 2 Diabetes with hypertension, hyperlipidemia:  Patient reports compliance with Mounjaro 12.5 mg weekly.  She is only using 20 units of Novolin R with meals.  Basal insulin has been discontinued.  She had no hypoglycemic episodes of blood sugars are running roughly 150-170.  She notes resolution of the rash that she was getting after injections for the last 3 weeks.  This seemed to be a localized site reactions.  Occasional nausea but overall is tolerating the Mounjaro without difficulty.  Compliant with her metformin, lisinopril, hydrochlorothiazide.  Last eye exam: Up-to-date Last foot exam: Up-to-date Last A1c:  Lab Results  Component Value Date   HGBA1C 8.0 (H) 01/29/2022   Nephropathy screen indicated?:  Needs Last flu, zoster and/or pneumovax:  Immunization History  Administered Date(s) Administered   Influenza Inj Mdck Quad With Preservative 01/19/2018   Influenza Split 01/21/2016, 01/13/2018, 02/05/2019   Influenza,inj,Quad PF,6+ Mos 01/25/2022   Influenza-Unspecified 02/16/2006, 02/06/2014, 01/22/2017, 01/19/2018, 02/25/2020, 01/13/2021, 01/17/2021   PFIZER(Purple Top)SARS-COV-2 Vaccination 06/10/2019   PPD Test 02/16/2006   Pneumococcal Polysaccharide-23 04/13/2018   ROS: Per HPI  Allergies  Allergen Reactions   Amoxicillin-Pot Clavulanate Anaphylaxis   Covid-19 (Mrna) Vaccine Anaphylaxis   Covid-19 Mrna Vacc (Moderna) Anaphylaxis   Macrobid [Nitrofurantoin Macrocrystal] Swelling and Rash   Basaglar Kwikpen [Insulin Glargine] Swelling   Penicillins    Avelox [Moxifloxacin Hcl In Nacl] Rash   Past Medical History:  Diagnosis Date   Environmental allergies    GAD (generalized anxiety disorder) 12/05/2018   GERD (gastroesophageal reflux disease)    Hypertension    Type 2 diabetes mellitus (HCC)     Current Outpatient Medications:     Ascorbic Acid (VITAMIN C) 1000 MG tablet, Take 1,000 mg by mouth daily., Disp: , Rfl:    azithromycin (ZITHROMAX) 250 MG tablet, Take 2 tablets today, then take 1 tablet daily until gone., Disp: 6 tablet, Rfl: 0   busPIRone (BUSPAR) 10 MG tablet, Take 1 tablet (10 mg total) by mouth 3 (three) times daily., Disp: 90 tablet, Rfl: 2   cetirizine (ZYRTEC) 10 MG tablet, Take 10 mg by mouth daily., Disp: , Rfl:    Continuous Blood Gluc Receiver ([place]) DEVI, Use as directed, Disp: 1 each, Rfl: 0   Continuous Blood Gluc Sensor (DEXCOM G6 SENSOR) MISC, Change every 10 days, Disp: 9 each, Rfl: 3   Continuous Blood Gluc Transmit (DEXCOM G6 TRANSMITTER) MISC, Use as directed every 90 days, Disp: 1 each, Rfl: 3   dorzolamide-timolol (COSOPT) 2-0.5 % ophthalmic solution, Place 1 drop into both eyes 2 (two) times daily., Disp: 10 mL, Rfl: 3   EPINEPHrine 0.3 mg/0.3 mL IJ SOAJ injection, Inject 0.3 mLs (0.3 mg total) into the muscle as needed for anaphylaxis., Disp: 1 each, Rfl: 0   fluticasone (FLONASE) 50 MCG/ACT nasal spray, PLACE 1 SPRAY INTO BOTH NOSTRILS DAILY., Disp: 48 g, Rfl: 1   glucose blood test strip, USE TO CHECK BLOOD SUGAR 3 TIMES DAILY, Disp: 300 strip, Rfl: 3   HUMULIN N 100 UNIT/ML injection, Inject 0.1 mLs (10 Units total) into the skin at bedtime. (Patient taking differently: Inject 20 Units into the skin at bedtime.), Disp: 30 mL, Rfl: 3   hydrochlorothiazide (HYDRODIURIL) 12.5 MG tablet, Take 1 tablet (12.5 mg total) by mouth daily., Disp: 90 tablet, Rfl: 0   hydrOXYzine (VISTARIL) 25 MG capsule, Take  1 capsule (25 mg total) by mouth every 8 (eight) hours as needed for anxiety or itching., Disp: 30 capsule, Rfl: 1   insulin NPH Human (HUMULIN N) 100 UNIT/ML injection, Inject 20 units into the skin at bedtime., Disp: 30 mL, Rfl: 5   insulin regular (HUMULIN R) 100 units/mL injection, Inject 0.3 mLs (30 Units total) into the skin 3 (three) times daily., Disp: 90 mL, Rfl: 0    Insulin Syringe-Needle U-100 (TECHLITE INSULIN SYRINGE) 31G X 15/64" 0.3 ML MISC, Use as directed 4 times a day to inject insulin, Disp: 400 each, Rfl: 3   Insulin Syringe-Needle U-100 (TECHLITE INSULIN SYRINGE) 31G X 15/64" 0.3 ML MISC, Use to inject insulin 4 times a day as directed, Disp: 400 each, Rfl: 4   Insulin Syringe-Needle U-100 31G X 5/16" 0.3 ML MISC, USE TO ADMINISTER INSULIN 4 TIMES DAILY AS DIRECTED, Disp: 400 each, Rfl: 3   Lancets (FREESTYLE) lancets, 1 each 3 (three) times daily., Disp: , Rfl:    latanoprost (XALATAN) 0.005 % ophthalmic solution, Place 1 drop in both eyes at bedtime, Disp: 2.5 mL, Rfl: 0   latanoprost (XALATAN) 0.005 % ophthalmic solution, Place 1 drop in both eyes at bedtime, Disp: 2.5 mL, Rfl: 0   lisinopril (ZESTRIL) 10 MG tablet, Take 1 tablet (10 mg total) by mouth daily., Disp: 90 tablet, Rfl: 3   metFORMIN (GLUCOPHAGE) 1000 MG tablet, Take 1 tablet by mouth 2  times daily with a meal., Disp: 180 tablet, Rfl: 0   NOVOFINE 32G X 6 MM MISC, , Disp: , Rfl:    ondansetron (ZOFRAN-ODT) 4 MG disintegrating tablet, Dissolve 1 tablet (4 mg total) by mouth every 8 (eight) hours as needed for nausea or vomiting., Disp: 20 tablet, Rfl: 0   pantoprazole (PROTONIX) 40 MG tablet, Take 1 tablet (40 mg total) by mouth 2 (two) times daily before a meal., Disp: 180 tablet, Rfl: 0   Prenatal Vit-Fe Fumarate-FA (PRENATAL VITAMIN PO), Take 1 tablet by mouth daily., Disp: , Rfl:    tirzepatide (MOUNJARO) 15 MG/0.5ML Pen, Inject 15 mg into the skin once a week., Disp: 6 mL, Rfl: 4   TRUEPLUS INSULIN SYRINGE 30G X 5/16" 1 ML MISC, USE TO INJECT INSULIN 4 TIMES DAILY, Disp: 300 each, Rfl: 3 Social History   Socioeconomic History   Marital status: Married    Spouse name: Retail banker   Number of children: Not on file   Years of education: 18   Highest education level: Master's degree (e.g., MA, MS, MEng, MEd, MSW, MBA)  Occupational History   Occupation: third grade teacher  Tobacco  Use   Smoking status: Never   Smokeless tobacco: Never  Vaping Use   Vaping Use: Never used  Substance and Sexual Activity   Alcohol use: Yes    Comment: occasionally   Drug use: No   Sexual activity: Yes  Other Topics Concern   Not on file  Social History Narrative   Engaged to be married December 8 to [place] Determinants of Health   Financial Resource Strain: Low Risk  (02/15/2017)   Overall Financial Resource Strain (CARDIA)    Difficulty of Paying Living Expenses: Not hard at all  Food Insecurity: No Food Insecurity (02/15/2017)   Hunger Vital Sign    Worried About Running Out of Food in the Last Year: Never true    Ran Out of Food in the Last Year: Never true  Transportation Needs: No Transportation Needs (02/15/2017)   PRAPARE -  Hydrologist (Medical): No    Lack of Transportation (Non-Medical): No  Physical Activity: Sufficiently Active (02/15/2017)   Exercise Vital Sign    Days of Exercise per Week: 6 days    Minutes of Exercise per Session: 40 min  Stress: No Stress Concern Present (02/15/2017)   [place]    Feeling of Stress : Only a little  Social Connections: Moderately Integrated (02/15/2017)   Social Connection and Isolation Panel [NHANES]    Frequency of Communication with Friends and Family: More than three times a week    Frequency of Social Gatherings with Friends and Family: More than three times a week    Attends Religious Services: More than 4 times per year    Active Member of [employer] or Organizations: No    Attends Archivist Meetings: Never    Marital Status: Married  Human resources officer Violence: Not At Risk (02/15/2017)   Humiliation, Afraid, Rape, and Kick questionnaire    Fear of Current or Ex-Partner: No    Emotionally Abused: No    Physically Abused: No    Sexually Abused: No   Family History  Problem Relation Age of Onset    Birth defects Mother        one hand small   Arthritis Father    Diabetes Father    Heart disease Father 45       heart attack   Psoriasis Father    Psoriasis Brother    Stroke Paternal Grandmother        age 14   Cancer Paternal Grandfather     Objective: Office vital signs reviewed. BP (!) 154/96   Pulse 98   Temp 98.7 F (37.1 C)   Ht '5\' 5"'$  (1.651 m)   Wt 282 lb (127.9 kg)   LMP 04/07/2022   SpO2 100%   BMI 46.93 kg/m   Physical Examination:  General: Awake, alert, morbidly obese female, No acute distress HEENT: sclera white, MMM Cardio: regular rate and rhythm, S1S2 heard, no murmurs appreciated Pulm: clear to auscultation bilaterally, no wheezes, rhonchi or rales; normal work of breathing on room air  Assessment/ Plan: 42 y.o. female   Type 2 diabetes mellitus with both eyes affected by proliferative retinopathy and macular edema, with long-term current use of insulin ([place]) - Plan: Bayer DCA Hb A1c Waived, CMP14+EGFR, tirzepatide (MOUNJARO) 15 MG/0.5ML Pen  Hypertension associated with diabetes ([place])  Sugar is not at goal with A1c of 8.4.  Advance Mounjaro to 15 mg weekly.  Continue current dosing of insulin.  May need to consider adding low-dose basal insulin back to her regimen pending these results.  Blood pressure was not at goal today.  She will return in 2 weeks for blood pressure recheck.  If not at goal, plan to advance the lisinopril to 20 mg daily   Orders Placed This Encounter  Procedures   Bayer DCA Hb A1c Waived   CMP14+EGFR   Meds ordered this encounter  Medications   tirzepatide (MOUNJARO) 15 MG/0.5ML Pen    Sig: Inject 15 mg into the skin once a week.    Dispense:  6 mL    Refill:  [place], [place] [phone]

## 2022-05-04 ENCOUNTER — Other Ambulatory Visit (HOSPITAL_COMMUNITY): Payer: Self-pay

## 2022-05-04 ENCOUNTER — Encounter: Payer: Self-pay | Admitting: Family Medicine

## 2022-05-04 ENCOUNTER — Other Ambulatory Visit: Payer: Self-pay

## 2022-05-04 LAB — CMP14+EGFR
ALT: 23 IU/L (ref 0–32)
AST: 12 IU/L (ref 0–40)
Albumin/Globulin Ratio: 1.3 (ref 1.2–2.2)
Albumin: 4.1 g/dL (ref 3.9–4.9)
Alkaline Phosphatase: 66 IU/L (ref 44–121)
BUN/Creatinine Ratio: 21 (ref 9–23)
BUN: 16 mg/dL (ref 6–24)
Bilirubin Total: <0.2 mg/dL (ref 0.0–1.2)
CO2: 21 mmol/L (ref 20–29)
Calcium: 9.6 mg/dL (ref 8.7–10.2)
Chloride: 95 mmol/L — ABNORMAL LOW (ref 96–106)
Creatinine, Ser: 0.77 mg/dL (ref 0.57–1.00)
Globulin, Total: 3.2 g/dL (ref 1.5–4.5)
Glucose: 175 mg/dL — ABNORMAL HIGH (ref 70–99)
Potassium: 4.2 mmol/L (ref 3.5–5.2)
Sodium: 133 mmol/L — ABNORMAL LOW (ref 134–144)
Total Protein: 7.3 g/dL (ref 6.0–8.5)
eGFR: 99 mL/min/{1.73_m2} (ref >59)

## 2022-05-04 LAB — BAYER DCA HB A1C WAIVED: HB A1C (BAYER DCA - WAIVED): 8.4 % — ABNORMAL HIGH (ref 4.8–5.6)

## 2022-05-04 MED ORDER — ONDANSETRON 4 MG PO TBDP
4.0000 mg | ORAL_TABLET | Freq: Three times a day (TID) | ORAL | 0 refills | Status: DC | PRN
  Filled 2022-05-04: qty 20, 7d supply, fill #0

## 2022-05-04 MED ORDER — HYDROCHLOROTHIAZIDE 12.5 MG PO TABS
12.5000 mg | ORAL_TABLET | Freq: Every day | ORAL | 1 refills | Status: DC
  Filled 2022-05-04 – 2022-06-27 (×2): qty 90, 90d supply, fill #0
  Filled 2022-08-08 – 2022-09-20 (×3): qty 90, 90d supply, fill #1

## 2022-05-04 MED ORDER — PANTOPRAZOLE SODIUM 40 MG PO TBEC
40.0000 mg | DELAYED_RELEASE_TABLET | Freq: Two times a day (BID) | ORAL | 1 refills | Status: DC
  Filled 2022-05-04: qty 180, 90d supply, fill #0
  Filled 2022-08-23: qty 180, 90d supply, fill #1

## 2022-05-04 MED ORDER — METFORMIN HCL 1000 MG PO TABS
1000.0000 mg | ORAL_TABLET | Freq: Two times a day (BID) | ORAL | 0 refills | Status: DC
  Filled 2022-05-04 – 2022-05-10 (×2): qty 180, 90d supply, fill #0

## 2022-05-05 ENCOUNTER — Other Ambulatory Visit (HOSPITAL_COMMUNITY): Payer: Self-pay

## 2022-05-05 MED ORDER — GLUCOSE BLOOD VI STRP
ORAL_STRIP | 3 refills | Status: DC
  Filled 2022-05-05: qty 200, 67d supply, fill #0
  Filled 2022-05-06: qty 300, 100d supply, fill #0
  Filled 2022-06-27: qty 300, fill #0
  Filled 2022-07-05: qty 200, 66d supply, fill #0
  Filled 2022-09-02: qty 200, 66d supply, fill #1
  Filled 2022-10-06 – 2023-01-03 (×7): qty 200, 66d supply, fill #2
  Filled 2023-02-18 – 2023-03-06 (×2): qty 200, 66d supply, fill #3

## 2022-05-06 ENCOUNTER — Other Ambulatory Visit (HOSPITAL_COMMUNITY): Payer: Self-pay

## 2022-05-07 ENCOUNTER — Other Ambulatory Visit: Payer: Self-pay

## 2022-05-10 ENCOUNTER — Other Ambulatory Visit: Payer: Self-pay

## 2022-05-11 ENCOUNTER — Other Ambulatory Visit (HOSPITAL_COMMUNITY): Payer: Self-pay

## 2022-05-11 MED ORDER — INSULIN REGULAR HUMAN 100 UNIT/ML IJ SOLN
30.0000 [IU] | Freq: Three times a day (TID) | INTRAMUSCULAR | 0 refills | Status: DC
  Filled 2022-05-11: qty 80, 88d supply, fill #0
  Filled 2022-08-08: qty 10, 11d supply, fill #1

## 2022-05-13 ENCOUNTER — Other Ambulatory Visit (HOSPITAL_COMMUNITY): Payer: Self-pay

## 2022-05-14 ENCOUNTER — Other Ambulatory Visit: Payer: Self-pay

## 2022-05-18 ENCOUNTER — Other Ambulatory Visit (HOSPITAL_COMMUNITY): Payer: Self-pay

## 2022-05-25 ENCOUNTER — Other Ambulatory Visit (HOSPITAL_COMMUNITY): Payer: Self-pay

## 2022-05-26 ENCOUNTER — Encounter: Payer: Self-pay | Admitting: Family Medicine

## 2022-05-27 ENCOUNTER — Other Ambulatory Visit (HOSPITAL_COMMUNITY): Payer: Self-pay

## 2022-05-31 ENCOUNTER — Other Ambulatory Visit (HOSPITAL_COMMUNITY): Payer: Self-pay

## 2022-06-03 ENCOUNTER — Other Ambulatory Visit (HOSPITAL_COMMUNITY): Payer: Self-pay

## 2022-06-07 ENCOUNTER — Other Ambulatory Visit: Payer: Self-pay

## 2022-06-10 ENCOUNTER — Other Ambulatory Visit (HOSPITAL_COMMUNITY): Payer: Self-pay

## 2022-06-10 MED ORDER — LATANOPROST 0.005 % OP SOLN
OPHTHALMIC | 3 refills | Status: DC
  Filled 2022-06-10: qty 2.5, 28d supply, fill #0
  Filled 2022-06-29 – 2022-07-05 (×2): qty 2.5, 28d supply, fill #1
  Filled 2022-08-01: qty 2.5, 28d supply, fill #2
  Filled 2022-09-06: qty 2.5, 25d supply, fill #3

## 2022-06-27 ENCOUNTER — Other Ambulatory Visit: Payer: Self-pay

## 2022-06-27 ENCOUNTER — Other Ambulatory Visit: Payer: Self-pay | Admitting: Family Medicine

## 2022-06-27 DIAGNOSIS — R110 Nausea: Secondary | ICD-10-CM

## 2022-06-28 ENCOUNTER — Other Ambulatory Visit: Payer: Self-pay

## 2022-06-28 ENCOUNTER — Other Ambulatory Visit (HOSPITAL_COMMUNITY): Payer: Self-pay

## 2022-06-28 MED ORDER — ONDANSETRON 4 MG PO TBDP
4.0000 mg | ORAL_TABLET | Freq: Three times a day (TID) | ORAL | 1 refills | Status: DC | PRN
  Filled 2022-06-28: qty 20, 7d supply, fill #0
  Filled 2022-10-02: qty 20, 7d supply, fill #1

## 2022-06-28 MED ORDER — BUSPIRONE HCL 10 MG PO TABS
10.0000 mg | ORAL_TABLET | Freq: Three times a day (TID) | ORAL | 1 refills | Status: DC
  Filled 2022-06-28: qty 90, 30d supply, fill #0
  Filled 2022-08-01: qty 90, 30d supply, fill #1

## 2022-06-30 ENCOUNTER — Other Ambulatory Visit (HOSPITAL_COMMUNITY): Payer: Self-pay

## 2022-07-01 ENCOUNTER — Other Ambulatory Visit: Payer: Self-pay

## 2022-07-06 ENCOUNTER — Other Ambulatory Visit: Payer: Self-pay

## 2022-07-12 ENCOUNTER — Other Ambulatory Visit (HOSPITAL_COMMUNITY): Payer: Self-pay

## 2022-07-14 ENCOUNTER — Encounter: Payer: Self-pay | Admitting: Family Medicine

## 2022-07-19 ENCOUNTER — Other Ambulatory Visit (HOSPITAL_COMMUNITY): Payer: Self-pay

## 2022-07-19 ENCOUNTER — Other Ambulatory Visit: Payer: Self-pay

## 2022-07-19 ENCOUNTER — Telehealth: Payer: Self-pay | Admitting: Family Medicine

## 2022-07-19 DIAGNOSIS — E113513 Type 2 diabetes mellitus with proliferative diabetic retinopathy with macular edema, bilateral: Secondary | ICD-10-CM

## 2022-07-19 NOTE — Telephone Encounter (Signed)
  Prescription Request  07/19/2022  Is this a "Controlled Substance" medicine?   Have you seen your PCP in the last 2 weeks? APPT 5/3  If YES, route message to pool  -  If NO, patient needs to be scheduled for appointment.  What is the name of the medication or equipment?  tirzepatide Greggory Keen) 12 MG  Have you contacted your pharmacy to request a refill? YES, OUT OF 15 MG    Which pharmacy would you like this sent to?  [place] [facility] (Ph: [phone])   Patient notified that their request is being sent to the clinical staff for review and that they should receive a response within 2 business days.

## 2022-07-20 ENCOUNTER — Other Ambulatory Visit: Payer: Self-pay

## 2022-07-20 ENCOUNTER — Encounter: Payer: Self-pay | Admitting: Family Medicine

## 2022-07-20 MED ORDER — TIRZEPATIDE 12.5 MG/0.5ML ~~LOC~~ SOAJ
12.5000 mg | SUBCUTANEOUS | 1 refills | Status: DC
  Filled 2022-07-20: qty 6, 84d supply, fill #0

## 2022-07-20 NOTE — Telephone Encounter (Signed)
Please advise Pharmacy out of 15 mg/0.5 ml of Mounjaro Pt request for 12 mg

## 2022-07-20 NOTE — Addendum Note (Signed)
Addended by: Raliegh Ip on: 07/20/2022 02:34 PM   Modules accepted: Orders

## 2022-07-21 ENCOUNTER — Other Ambulatory Visit: Payer: Self-pay | Admitting: Family Medicine

## 2022-07-21 DIAGNOSIS — I152 Hypertension secondary to endocrine disorders: Secondary | ICD-10-CM

## 2022-07-22 ENCOUNTER — Other Ambulatory Visit: Payer: Self-pay

## 2022-07-22 ENCOUNTER — Other Ambulatory Visit (HOSPITAL_COMMUNITY): Payer: Self-pay

## 2022-07-22 MED ORDER — FLUTICASONE PROPIONATE 50 MCG/ACT NA SUSP
1.0000 | Freq: Every day | NASAL | 1 refills | Status: DC
  Filled 2022-07-22 – 2022-08-03 (×2): qty 16, 60d supply, fill #0
  Filled 2022-10-02: qty 16, 60d supply, fill #1
  Filled 2022-12-14: qty 16, 60d supply, fill #2
  Filled 2023-01-03 – 2023-02-09 (×2): qty 16, 60d supply, fill #3
  Filled 2023-03-06 – 2023-04-11 (×2): qty 16, 60d supply, fill #4
  Filled 2023-06-13: qty 16, 60d supply, fill #5

## 2022-07-22 MED ORDER — LISINOPRIL 10 MG PO TABS
10.0000 mg | ORAL_TABLET | Freq: Every day | ORAL | 0 refills | Status: DC
Start: 2022-07-22 — End: 2022-09-06
  Filled 2022-07-22: qty 90, 90d supply, fill #0

## 2022-07-27 DIAGNOSIS — K760 Fatty (change of) liver, not elsewhere classified: Secondary | ICD-10-CM | POA: Diagnosis not present

## 2022-07-27 DIAGNOSIS — E113513 Type 2 diabetes mellitus with proliferative diabetic retinopathy with macular edema, bilateral: Secondary | ICD-10-CM | POA: Diagnosis not present

## 2022-07-27 DIAGNOSIS — D3502 Benign neoplasm of left adrenal gland: Secondary | ICD-10-CM | POA: Diagnosis not present

## 2022-07-27 DIAGNOSIS — E1165 Type 2 diabetes mellitus with hyperglycemia: Secondary | ICD-10-CM | POA: Diagnosis not present

## 2022-07-29 ENCOUNTER — Encounter (HOSPITAL_COMMUNITY): Payer: Self-pay

## 2022-07-29 ENCOUNTER — Other Ambulatory Visit: Payer: Self-pay

## 2022-07-29 ENCOUNTER — Other Ambulatory Visit (HOSPITAL_COMMUNITY): Payer: Self-pay

## 2022-07-29 MED ORDER — JARDIANCE 10 MG PO TABS
10.0000 mg | ORAL_TABLET | Freq: Every day | ORAL | 4 refills | Status: DC
  Filled 2022-07-29: qty 90, 90d supply, fill #0
  Filled 2023-01-26: qty 90, 90d supply, fill #1
  Filled 2023-02-18 – 2023-04-11 (×2): qty 90, 90d supply, fill #2

## 2022-07-29 MED ORDER — DEXCOM G7 SENSOR MISC
4 refills | Status: DC
  Filled 2022-07-29: qty 9, 90d supply, fill #0

## 2022-08-02 ENCOUNTER — Encounter: Payer: Self-pay | Admitting: Family Medicine

## 2022-08-02 ENCOUNTER — Other Ambulatory Visit (HOSPITAL_COMMUNITY): Payer: Self-pay

## 2022-08-02 ENCOUNTER — Other Ambulatory Visit: Payer: Self-pay

## 2022-08-03 ENCOUNTER — Other Ambulatory Visit: Payer: Self-pay

## 2022-08-04 ENCOUNTER — Other Ambulatory Visit (HOSPITAL_COMMUNITY): Payer: Self-pay

## 2022-08-04 ENCOUNTER — Other Ambulatory Visit: Payer: Self-pay

## 2022-08-04 ENCOUNTER — Ambulatory Visit: Payer: Commercial Managed Care - PPO | Admitting: Cardiology

## 2022-08-06 ENCOUNTER — Encounter: Payer: Self-pay | Admitting: Family Medicine

## 2022-08-06 ENCOUNTER — Ambulatory Visit: Payer: Commercial Managed Care - PPO | Admitting: Family Medicine

## 2022-08-06 VITALS — BP 148/83 | HR 100 | Temp 98.6°F | Ht 65.0 in | Wt 273.0 lb

## 2022-08-06 DIAGNOSIS — Z0001 Encounter for general adult medical examination with abnormal findings: Secondary | ICD-10-CM

## 2022-08-06 DIAGNOSIS — Z0000 Encounter for general adult medical examination without abnormal findings: Secondary | ICD-10-CM

## 2022-08-06 DIAGNOSIS — Z794 Long term (current) use of insulin: Secondary | ICD-10-CM

## 2022-08-06 DIAGNOSIS — E113513 Type 2 diabetes mellitus with proliferative diabetic retinopathy with macular edema, bilateral: Secondary | ICD-10-CM | POA: Diagnosis not present

## 2022-08-06 NOTE — Progress Notes (Signed)
Subjective: CC:DM PCP: Raliegh Ip, DO [id number] Amy Ball is a 42 y.o. female presenting to clinic today for:  1. Type 2 Diabetes with hypertension, hyperlipidemia:  Compliant with medications.  She did have to go down on her Mounjaro to 12.5 mg due to backorder but just got a call that the 15 mg is now available.  Her average blood sugars on her CGM have been running around 150s.  Lowest blood sugar was 79 and she is happy to say that she was not symptomatic in the 70s and did not have to chase that " low" with any types of sugary foods or beverage.  She was just restarted on Jardiance 10 mg daily by her endocrinologist.  She has not yet started taking the pill but wanted to inform you that that was on her regimen now.  She is totally off the Humalog and and Novolin in.  Only takes about 15 to 20 units of the Novolin R with meals.  Occasionally does not need at bedtime.  Still taking the metformin as prescribed.  Compliant with HCTZ and lisinopril  Last eye exam: UTD, continues to get eye injections Last foot exam: TUD Last A1c:  Lab Results  Component Value Date   HGBA1C 8.4 (Amy) 05/03/2022   Nephropathy screen indicated?: UTD Last flu, zoster and/or pneumovax:  Immunization History  Administered Date(s) Administered   Influenza Inj Mdck Quad With Preservative 01/19/2018   Influenza Split 01/21/2016, 01/13/2018, 02/05/2019   Influenza,inj,Quad PF,6+ Mos 01/25/2022   Influenza-Unspecified 02/16/2006, 02/06/2014, 01/22/2017, 01/19/2018, 02/25/2020, 01/13/2021, 01/17/2021   PFIZER(Purple Top)SARS-COV-2 Vaccination 06/10/2019   PPD Test 02/16/2006   Pneumococcal Polysaccharide-23 04/13/2018    ROS: No chest pain, shortness of breath, foot ulcerations.    ROS: Per HPI.  Otherwise review of systems negative  Allergies  Allergen Reactions   Amoxicillin-Pot Clavulanate Anaphylaxis   Covid-19 (Mrna) Vaccine Anaphylaxis   Covid-19 Mrna Vacc (Moderna) Anaphylaxis    Macrobid [Nitrofurantoin Macrocrystal] Swelling and Rash   Basaglar Kwikpen [Insulin Glargine] Swelling   Penicillins    Avelox [Moxifloxacin Hcl In Nacl] Rash   Past Medical History:  Diagnosis Date   Environmental allergies    GAD (generalized anxiety disorder) 12/05/2018   GERD (gastroesophageal reflux disease)    Hypertension    Type 2 diabetes mellitus (HCC)     Current Outpatient Medications:    Ascorbic Acid (VITAMIN C) 1000 MG tablet, Take 1,000 mg by mouth daily., Disp: , Rfl:    azithromycin (ZITHROMAX) 250 MG tablet, Take 2 tablets today, then take 1 tablet daily until gone., Disp: 6 tablet, Rfl: 0   busPIRone (BUSPAR) 10 MG tablet, Take 1 tablet (10 mg total) by mouth 3 (three) times daily., Disp: 90 tablet, Rfl: 1   cetirizine (ZYRTEC) 10 MG tablet, Take 10 mg by mouth daily., Disp: , Rfl:    Continuous Glucose Sensor (DEXCOM G7 SENSOR) MISC, Use to check blood sugar as directed. Change every 10 days, Disp: 9 each, Rfl: 4   dorzolamide-timolol (COSOPT) 2-0.5 % ophthalmic solution, Place 1 drop into both eyes 2 (two) times daily., Disp: 10 mL, Rfl: 3   empagliflozin (JARDIANCE) 10 MG TABS tablet, Take 1 tablet (10 mg total) by mouth daily., Disp: 90 tablet, Rfl: 4   EPINEPHrine 0.3 mg/0.3 mL IJ SOAJ injection, Inject 0.3 mLs (0.3 mg total) into the muscle as needed for anaphylaxis., Disp: 1 each, Rfl: 0   fluticasone (FLONASE) 50 MCG/ACT nasal spray, Place 1 spray into both nostrils  daily., Disp: 48 g, Rfl: 1   glucose blood test strip, Use three times daily to check blood sugar as directed, Disp: 300 each, Rfl: 3   hydrochlorothiazide (HYDRODIURIL) 12.5 MG tablet, Take 1 tablet (12.5 mg total) by mouth daily., Disp: 90 tablet, Rfl: 1   hydrOXYzine (VISTARIL) 25 MG capsule, Take 1 capsule (25 mg total) by mouth every 8 (eight) hours as needed for anxiety or itching., Disp: 30 capsule, Rfl: 1   insulin regular (HUMULIN R) 100 units/mL injection, Inject 0.3 mLs (30 Units total)  into the skin 3 (three) times daily. Must make appointment for refills, Disp: 90 mL, Rfl: 0   Insulin Syringe-Needle U-100 (TECHLITE INSULIN SYRINGE) 31G X 15/64" 0.3 ML MISC, Use as directed 4 times a day to inject insulin, Disp: 400 each, Rfl: 3   Insulin Syringe-Needle U-100 (TECHLITE INSULIN SYRINGE) 31G X 15/64" 0.3 ML MISC, Use to inject insulin 4 times a day as directed, Disp: 400 each, Rfl: 4   Insulin Syringe-Needle U-100 31G X 5/16" 0.3 ML MISC, USE TO ADMINISTER INSULIN 4 TIMES DAILY AS DIRECTED, Disp: 400 each, Rfl: 3   Lancets (FREESTYLE) lancets, 1 each 3 (three) times daily., Disp: , Rfl:    latanoprost (XALATAN) 0.005 % ophthalmic solution, Place 1 drop in both eyes at bedtime, Disp: 2.5 mL, Rfl: 0   latanoprost (XALATAN) 0.005 % ophthalmic solution, Place 1 drop in both eyes at bedtime, Disp: 2.5 mL, Rfl: 0   latanoprost (XALATAN) 0.005 % ophthalmic solution, Instill 1 drop in both eyes at bedtime, Disp: 2.5 mL, Rfl: 3   lisinopril (ZESTRIL) 10 MG tablet, Take 1 tablet (10 mg total) by mouth daily., Disp: 90 tablet, Rfl: 0   metFORMIN (GLUCOPHAGE) 1000 MG tablet, Take 1 tablet (1,000 mg total) by mouth 2 (two) times daily with a meal., Disp: 180 tablet, Rfl: 0   NOVOFINE 32G X 6 MM MISC, , Disp: , Rfl:    ondansetron (ZOFRAN-ODT) 4 MG disintegrating tablet, Dissolve 1 tablet (4 mg total) by mouth every 8 (eight) hours as needed for nausea or vomiting., Disp: 20 tablet, Rfl: 1   pantoprazole (PROTONIX) 40 MG tablet, Take 1 tablet (40 mg total) by mouth 2 (two) times daily before a meal., Disp: 180 tablet, Rfl: 1   Prenatal Vit-Fe Fumarate-FA (PRENATAL VITAMIN PO), Take 1 tablet by mouth daily., Disp: , Rfl:    tirzepatide (MOUNJARO) 12.5 MG/0.5ML Pen, Inject 12.5 mg into the skin once a week., Disp: 6 mL, Rfl: 1   tirzepatide (MOUNJARO) 15 MG/0.5ML Pen, Inject 15 mg into the skin once a week., Disp: 6 mL, Rfl: 4   TRUEPLUS INSULIN SYRINGE 30G X 5/16" 1 ML MISC, USE TO INJECT INSULIN 4  TIMES DAILY, Disp: 300 each, Rfl: 3 Social History   Socioeconomic History   Marital status: Married    Spouse name: Therapist, nutritional   Number of children: Not on file   Years of education: 18   Highest education level: Master's degree (e.g., MA, MS, MEng, MEd, MSW, MBA)  Occupational History   Occupation: third grade teacher  Tobacco Use   Smoking status: Never   Smokeless tobacco: Never  Vaping Use   Vaping Use: Never used  Substance and Sexual Activity   Alcohol use: Yes    Comment: occasionally   Drug use: No   Sexual activity: Yes  Other Topics Concern   Not on file  Social History Narrative   Engaged to be married December 8 to [place]  Social Determinants of Health   Financial Resource Strain: Low Risk  (02/15/2017)   Overall Financial Resource Strain (CARDIA)    Difficulty of Paying Living Expenses: Not hard at all  Food Insecurity: No Food Insecurity (02/15/2017)   Hunger Vital Sign    Worried About Running Out of Food in the Last Year: Never true    Ran Out of Food in the Last Year: Never true  Transportation Needs: No Transportation Needs (02/15/2017)   PRAPARE - Administrator, Civil Service (Medical): No    Lack of Transportation (Non-Medical): No  Physical Activity: Sufficiently Active (02/15/2017)   Exercise Vital Sign    Days of Exercise per Week: 6 days    Minutes of Exercise per Session: 40 min  Stress: No Stress Concern Present (02/15/2017)   [employer] of Occupational Health - Occupational Stress Questionnaire    Feeling of Stress : Only a little  Social Connections: Moderately Integrated (02/15/2017)   Social Connection and Isolation Panel [NHANES]    Frequency of Communication with Friends and Family: More than three times a week    Frequency of Social Gatherings with Friends and Family: More than three times a week    Attends Religious Services: More than 4 times per year    Active Member of [employer] or Organizations: No    Attends Occupational hygienist Meetings: Never    Marital Status: Married  Catering manager Violence: Not At Risk (02/15/2017)   Humiliation, Afraid, Rape, and Kick questionnaire    Fear of Current or Ex-Partner: No    Emotionally Abused: No    Physically Abused: No    Sexually Abused: No   Family History  Problem Relation Age of Onset   Birth defects Mother        one hand small   Arthritis Father    Diabetes Father    Heart disease Father 45       heart attack   Psoriasis Father    Psoriasis Brother    Stroke Paternal Grandmother        age 64   Cancer Paternal Grandfather     Objective: Office vital signs reviewed. BP (!) 148/83   Pulse 100   Temp 98.6 F (37 C)   Ht 5\' 5"  (1.651 m)   Wt 273 lb (123.8 kg)   SpO2 100%   BMI 45.43 kg/m   Physical Examination:  General: Awake, alert, well appearing obese female, No acute distress HEENT: Normal    Neck: No masses palpated. No lymphadenopathy    Ears: Tympanic membranes intact, normal light reflex, no erythema, no bulging    Eyes: PERRLA, extraocular membranes intact, sclera white    Nose: nasal turbinates moist, no nasal discharge    Throat: moist mucus membranes, no erythema, no tonsillar exudate.  Airway is patent Cardio: regular rate and rhythm, S1S2 heard, no murmurs appreciated Pulm: clear to auscultation bilaterally, no wheezes, rhonchi or rales; normal work of breathing on room air GI: soft, non-tender, non-distended, bowel sounds present x4, no hepatomegaly, no splenomegaly, no masses GU: not examined Extremities: warm, well perfused, No edema, cyanosis or clubbing; +2 pulses bilaterally MSK: normal gait and station Skin: dry; intact; no rashes or lesions Neuro: No focal neurologic deficits.  Patellar DTRs present and symmetric Psych: Mood stable, speech normal, affect appropriate     08/06/2022    3:48 PM 05/03/2022    3:38 PM 01/29/2022    9:57 AM  Depression screen PHQ 2/9  Decreased Interest 0 0 0  Down,  Depressed, Hopeless 0 0 0  PHQ - 2 Score 0 0 0  Altered sleeping 0 0 0  Tired, decreased energy 0 0 0  Change in appetite 0 0 0  Feeling bad or failure about yourself  0 0 0  Trouble concentrating 0 0 0  Moving slowly or fidgety/restless 0 0 0  Suicidal thoughts 0 0 0  PHQ-9 Score 0 0 0  Difficult doing work/chores Not difficult at all Not difficult at all Not difficult at all      08/06/2022    3:48 PM 05/03/2022    3:38 PM 01/29/2022    9:58 AM 10/26/2021   11:39 AM  GAD 7 : Generalized Anxiety Score  Nervous, Anxious, on Edge 0 1 1 0  Control/stop worrying 0 0 0 0  Worry too much - different things 0 0 0 0  Trouble relaxing 0 0 0 0  Restless 0 0 0 0  Easily annoyed or irritable 0 1 1 0  Afraid - awful might happen 0 0 0 0  Total GAD 7 Score 0 2 2 0  Anxiety Difficulty Not difficult at all Not difficult at all Not difficult at all Not difficult at all    Assessment/ Plan: 42 y.o. female   Annual physical exam  Type 2 diabetes mellitus with both eyes affected by proliferative retinopathy and macular edema, with long-term current use of insulin (HCC) - Plan: Bayer DCA Hb A1c Waived, Lipid panel, CMP14+EGFR, CBC  Morbid obesity (HCC) - Plan: CMP14+EGFR, VITAMIN D 25 Hydroxy (Vit-D Deficiency, Fractures)    No orders of the defined types were placed in this encounter.  No orders of the defined types were placed in this encounter.    Raliegh Ip, DO [facility] [phone]

## 2022-08-08 ENCOUNTER — Other Ambulatory Visit: Payer: Self-pay | Admitting: Family Medicine

## 2022-08-09 ENCOUNTER — Encounter: Payer: Self-pay | Admitting: Family Medicine

## 2022-08-09 ENCOUNTER — Other Ambulatory Visit (HOSPITAL_COMMUNITY): Payer: Self-pay

## 2022-08-09 ENCOUNTER — Other Ambulatory Visit: Payer: Self-pay

## 2022-08-09 LAB — BAYER DCA HB A1C WAIVED: HB A1C (BAYER DCA - WAIVED): 7.9 % — ABNORMAL HIGH (ref 4.8–5.6)

## 2022-08-09 MED ORDER — CETIRIZINE HCL 10 MG PO TABS
10.0000 mg | ORAL_TABLET | Freq: Every day | ORAL | 3 refills | Status: DC
  Filled 2022-08-09: qty 90, 90d supply, fill #0
  Filled 2022-10-02: qty 90, 90d supply, fill #1
  Filled 2022-10-30 – 2023-01-03 (×4): qty 90, 90d supply, fill #2
  Filled 2023-03-06 – 2023-04-11 (×2): qty 90, 90d supply, fill #3

## 2022-08-09 MED ORDER — METFORMIN HCL 1000 MG PO TABS
1000.0000 mg | ORAL_TABLET | Freq: Two times a day (BID) | ORAL | 0 refills | Status: DC
  Filled 2022-08-09: qty 180, 90d supply, fill #0

## 2022-08-10 ENCOUNTER — Other Ambulatory Visit: Payer: Self-pay

## 2022-08-12 ENCOUNTER — Other Ambulatory Visit: Payer: Self-pay

## 2022-08-12 ENCOUNTER — Other Ambulatory Visit (HOSPITAL_COMMUNITY): Payer: Self-pay

## 2022-08-12 MED ORDER — INSULIN REGULAR HUMAN 100 UNIT/ML IJ SOLN
20.0000 [IU] | Freq: Three times a day (TID) | INTRAMUSCULAR | 3 refills | Status: DC
  Filled 2022-08-12: qty 50, 84d supply, fill #0
  Filled 2023-01-03: qty 50, 84d supply, fill #1
  Filled 2023-04-11: qty 50, 84d supply, fill #2

## 2022-08-13 ENCOUNTER — Encounter (INDEPENDENT_AMBULATORY_CARE_PROVIDER_SITE_OTHER): Payer: Commercial Managed Care - PPO | Admitting: Family Medicine

## 2022-08-13 DIAGNOSIS — J019 Acute sinusitis, unspecified: Secondary | ICD-10-CM | POA: Diagnosis not present

## 2022-08-13 DIAGNOSIS — B9689 Other specified bacterial agents as the cause of diseases classified elsewhere: Secondary | ICD-10-CM

## 2022-08-16 MED ORDER — FLUCONAZOLE 150 MG PO TABS
150.0000 mg | ORAL_TABLET | Freq: Once | ORAL | 0 refills | Status: AC
Start: 2022-08-16 — End: 2022-08-16

## 2022-08-16 MED ORDER — AZITHROMYCIN 250 MG PO TABS
ORAL_TABLET | ORAL | 0 refills | Status: DC
Start: 2022-08-16 — End: 2023-01-18

## 2022-08-16 NOTE — Telephone Encounter (Signed)

## 2022-08-17 ENCOUNTER — Other Ambulatory Visit: Payer: Self-pay

## 2022-08-23 ENCOUNTER — Other Ambulatory Visit: Payer: Self-pay | Admitting: Family Medicine

## 2022-08-24 ENCOUNTER — Other Ambulatory Visit: Payer: Self-pay

## 2022-08-24 MED ORDER — FREESTYLE LANCETS MISC
3 refills | Status: AC
  Filled 2022-08-24: qty 200, 66d supply, fill #0
  Filled 2022-11-23: qty 200, 66d supply, fill #1
  Filled 2023-03-06: qty 200, 66d supply, fill #2

## 2022-08-25 ENCOUNTER — Other Ambulatory Visit (HOSPITAL_COMMUNITY): Payer: Self-pay

## 2022-08-25 ENCOUNTER — Other Ambulatory Visit: Payer: Self-pay

## 2022-09-02 ENCOUNTER — Other Ambulatory Visit: Payer: Self-pay

## 2022-09-02 ENCOUNTER — Other Ambulatory Visit (HOSPITAL_COMMUNITY): Payer: Self-pay

## 2022-09-02 MED ORDER — FREESTYLE LITE TEST VI STRP
1.0000 | ORAL_STRIP | Freq: Three times a day (TID) | 3 refills | Status: DC
  Filled 2022-09-02 – 2022-09-20 (×2): qty 300, 100d supply, fill #0
  Filled 2022-10-24: qty 200, 67d supply, fill #0
  Filled 2022-10-30: qty 300, 100d supply, fill #0
  Filled 2022-10-30: qty 200, 67d supply, fill #0
  Filled 2023-01-03 – 2023-04-26 (×9): qty 200, 67d supply, fill #1
  Filled 2023-05-05: qty 250, 84d supply, fill #1
  Filled 2023-05-17: qty 200, 67d supply, fill #1
  Filled 2023-07-29: qty 200, 67d supply, fill #2

## 2022-09-03 ENCOUNTER — Other Ambulatory Visit (HOSPITAL_COMMUNITY): Payer: Self-pay

## 2022-09-04 ENCOUNTER — Other Ambulatory Visit (HOSPITAL_COMMUNITY): Payer: Self-pay

## 2022-09-04 MED ORDER — GLUCOSE BLOOD VI STRP
ORAL_STRIP | 3 refills | Status: AC
  Filled 2022-10-02: qty 300, fill #0
  Filled 2022-10-06: qty 300, 100d supply, fill #0
  Filled 2022-12-14: qty 300, fill #0
  Filled 2023-05-17: qty 300, 100d supply, fill #0
  Filled 2023-06-13: qty 300, 90d supply, fill #0
  Filled 2023-07-29: qty 300, 100d supply, fill #0

## 2022-09-06 ENCOUNTER — Other Ambulatory Visit: Payer: Self-pay | Admitting: Family Medicine

## 2022-09-06 DIAGNOSIS — E113413 Type 2 diabetes mellitus with severe nonproliferative diabetic retinopathy with macular edema, bilateral: Secondary | ICD-10-CM | POA: Diagnosis not present

## 2022-09-06 DIAGNOSIS — I152 Hypertension secondary to endocrine disorders: Secondary | ICD-10-CM

## 2022-09-07 ENCOUNTER — Other Ambulatory Visit: Payer: Self-pay

## 2022-09-07 ENCOUNTER — Other Ambulatory Visit (HOSPITAL_COMMUNITY): Payer: Self-pay

## 2022-09-07 MED ORDER — LISINOPRIL 10 MG PO TABS
10.0000 mg | ORAL_TABLET | Freq: Every day | ORAL | 1 refills | Status: DC
  Filled 2022-09-07 – 2022-10-30 (×3): qty 90, 90d supply, fill #0
  Filled 2022-11-23 – 2023-01-26 (×4): qty 90, 90d supply, fill #1

## 2022-09-07 MED ORDER — BUSPIRONE HCL 10 MG PO TABS
10.0000 mg | ORAL_TABLET | Freq: Three times a day (TID) | ORAL | 2 refills | Status: DC
  Filled 2022-09-07: qty 90, 30d supply, fill #0
  Filled 2022-10-02: qty 90, 30d supply, fill #1
  Filled 2022-10-30: qty 90, 30d supply, fill #2

## 2022-09-13 ENCOUNTER — Other Ambulatory Visit: Payer: Self-pay | Admitting: Family Medicine

## 2022-09-13 ENCOUNTER — Other Ambulatory Visit: Payer: Self-pay

## 2022-09-13 ENCOUNTER — Encounter: Payer: Self-pay | Admitting: Family Medicine

## 2022-09-13 MED ORDER — IBUPROFEN 800 MG PO TABS
800.0000 mg | ORAL_TABLET | Freq: Three times a day (TID) | ORAL | 0 refills | Status: AC | PRN
  Filled 2022-09-13: qty 30, 10d supply, fill #0

## 2022-09-14 ENCOUNTER — Encounter: Payer: Self-pay | Admitting: Family Medicine

## 2022-09-15 ENCOUNTER — Telehealth: Payer: Commercial Managed Care - PPO | Admitting: Family Medicine

## 2022-09-21 ENCOUNTER — Other Ambulatory Visit (HOSPITAL_COMMUNITY): Payer: Self-pay

## 2022-09-22 ENCOUNTER — Other Ambulatory Visit: Payer: Self-pay

## 2022-09-22 ENCOUNTER — Encounter: Payer: Self-pay | Admitting: Family Medicine

## 2022-09-24 ENCOUNTER — Other Ambulatory Visit (HOSPITAL_COMMUNITY): Payer: Self-pay

## 2022-10-01 ENCOUNTER — Other Ambulatory Visit (HOSPITAL_COMMUNITY): Payer: Self-pay

## 2022-10-02 ENCOUNTER — Other Ambulatory Visit: Payer: Self-pay | Admitting: Family Medicine

## 2022-10-02 ENCOUNTER — Other Ambulatory Visit (HOSPITAL_COMMUNITY): Payer: Self-pay

## 2022-10-02 DIAGNOSIS — I10 Essential (primary) hypertension: Secondary | ICD-10-CM

## 2022-10-02 DIAGNOSIS — R600 Localized edema: Secondary | ICD-10-CM

## 2022-10-02 DIAGNOSIS — K219 Gastro-esophageal reflux disease without esophagitis: Secondary | ICD-10-CM

## 2022-10-04 ENCOUNTER — Other Ambulatory Visit: Payer: Self-pay

## 2022-10-04 ENCOUNTER — Other Ambulatory Visit (HOSPITAL_COMMUNITY): Payer: Self-pay

## 2022-10-04 MED ORDER — PANTOPRAZOLE SODIUM 40 MG PO TBEC
40.0000 mg | DELAYED_RELEASE_TABLET | Freq: Two times a day (BID) | ORAL | 0 refills | Status: DC
Start: 2022-10-04 — End: 2022-12-14
  Filled 2022-10-04 – 2022-11-23 (×2): qty 180, 90d supply, fill #0

## 2022-10-04 MED ORDER — METFORMIN HCL 1000 MG PO TABS
1000.0000 mg | ORAL_TABLET | Freq: Two times a day (BID) | ORAL | 0 refills | Status: DC
  Filled 2022-10-04 – 2022-10-30 (×2): qty 180, 90d supply, fill #0

## 2022-10-04 MED ORDER — HYDROCHLOROTHIAZIDE 12.5 MG PO TABS
12.5000 mg | ORAL_TABLET | Freq: Every day | ORAL | 0 refills | Status: AC
Start: 2022-10-04 — End: ?
  Filled 2022-10-04 – 2022-12-14 (×4): qty 90, 90d supply, fill #0

## 2022-10-05 ENCOUNTER — Other Ambulatory Visit (HOSPITAL_COMMUNITY): Payer: Self-pay

## 2022-10-05 ENCOUNTER — Other Ambulatory Visit: Payer: Self-pay

## 2022-10-06 ENCOUNTER — Other Ambulatory Visit: Payer: Self-pay

## 2022-10-06 ENCOUNTER — Other Ambulatory Visit (HOSPITAL_COMMUNITY): Payer: Self-pay

## 2022-10-08 ENCOUNTER — Other Ambulatory Visit (HOSPITAL_COMMUNITY): Payer: Self-pay

## 2022-10-08 ENCOUNTER — Other Ambulatory Visit: Payer: Self-pay

## 2022-10-08 MED ORDER — LATANOPROST 0.005 % OP SOLN
1.0000 [drp] | Freq: Every day | OPHTHALMIC | 4 refills | Status: DC
  Filled 2022-10-08: qty 2.5, 25d supply, fill #0
  Filled 2022-11-23: qty 2.5, 25d supply, fill #1
  Filled 2022-12-14: qty 2.5, 25d supply, fill #2

## 2022-10-12 ENCOUNTER — Other Ambulatory Visit: Payer: Self-pay

## 2022-10-12 ENCOUNTER — Other Ambulatory Visit (HOSPITAL_COMMUNITY): Payer: Self-pay

## 2022-10-25 ENCOUNTER — Other Ambulatory Visit: Payer: Self-pay

## 2022-10-25 ENCOUNTER — Other Ambulatory Visit (HOSPITAL_COMMUNITY): Payer: Self-pay

## 2022-10-30 ENCOUNTER — Other Ambulatory Visit (HOSPITAL_COMMUNITY): Payer: Self-pay

## 2022-11-01 ENCOUNTER — Other Ambulatory Visit: Payer: Self-pay

## 2022-11-01 ENCOUNTER — Other Ambulatory Visit (HOSPITAL_COMMUNITY): Payer: Self-pay

## 2022-11-03 ENCOUNTER — Other Ambulatory Visit (HOSPITAL_COMMUNITY): Payer: Self-pay

## 2022-11-04 ENCOUNTER — Encounter: Payer: Self-pay | Admitting: Family Medicine

## 2022-11-15 ENCOUNTER — Ambulatory Visit: Payer: Commercial Managed Care - PPO | Admitting: Family Medicine

## 2022-11-17 DIAGNOSIS — Z01419 Encounter for gynecological examination (general) (routine) without abnormal findings: Secondary | ICD-10-CM | POA: Diagnosis not present

## 2022-11-17 DIAGNOSIS — Z01411 Encounter for gynecological examination (general) (routine) with abnormal findings: Secondary | ICD-10-CM | POA: Diagnosis not present

## 2022-11-17 DIAGNOSIS — Z113 Encounter for screening for infections with a predominantly sexual mode of transmission: Secondary | ICD-10-CM | POA: Diagnosis not present

## 2022-11-17 DIAGNOSIS — Z124 Encounter for screening for malignant neoplasm of cervix: Secondary | ICD-10-CM | POA: Diagnosis not present

## 2022-11-17 DIAGNOSIS — Z1231 Encounter for screening mammogram for malignant neoplasm of breast: Secondary | ICD-10-CM | POA: Diagnosis not present

## 2022-11-23 ENCOUNTER — Other Ambulatory Visit (HOSPITAL_COMMUNITY): Payer: Self-pay

## 2022-11-23 ENCOUNTER — Other Ambulatory Visit: Payer: Self-pay | Admitting: Family Medicine

## 2022-11-23 DIAGNOSIS — R110 Nausea: Secondary | ICD-10-CM

## 2022-11-23 LAB — HM PAP SMEAR

## 2022-11-24 ENCOUNTER — Other Ambulatory Visit: Payer: Self-pay

## 2022-11-24 ENCOUNTER — Other Ambulatory Visit (HOSPITAL_COMMUNITY): Payer: Self-pay

## 2022-11-24 MED ORDER — ONDANSETRON 4 MG PO TBDP
4.0000 mg | ORAL_TABLET | Freq: Three times a day (TID) | ORAL | 0 refills | Status: DC | PRN
Start: 2022-11-24 — End: 2023-06-19
  Filled 2022-11-24: qty 20, 7d supply, fill #0

## 2022-11-24 MED ORDER — BUSPIRONE HCL 10 MG PO TABS
10.0000 mg | ORAL_TABLET | Freq: Three times a day (TID) | ORAL | 2 refills | Status: AC
  Filled 2022-11-24: qty 90, 30d supply, fill #0
  Filled 2022-12-14 – 2023-01-03 (×2): qty 90, 30d supply, fill #1
  Filled 2023-02-02: qty 90, 30d supply, fill #2

## 2022-12-03 ENCOUNTER — Encounter: Payer: Self-pay | Admitting: Family Medicine

## 2022-12-14 ENCOUNTER — Other Ambulatory Visit: Payer: Self-pay | Admitting: Family Medicine

## 2022-12-14 ENCOUNTER — Other Ambulatory Visit (HOSPITAL_COMMUNITY): Payer: Self-pay

## 2022-12-14 DIAGNOSIS — K219 Gastro-esophageal reflux disease without esophagitis: Secondary | ICD-10-CM

## 2022-12-15 ENCOUNTER — Other Ambulatory Visit (HOSPITAL_COMMUNITY): Payer: Self-pay

## 2022-12-15 ENCOUNTER — Encounter (HOSPITAL_COMMUNITY): Payer: Self-pay | Admitting: Pharmacist

## 2022-12-15 MED ORDER — PANTOPRAZOLE SODIUM 40 MG PO TBEC
40.0000 mg | DELAYED_RELEASE_TABLET | Freq: Two times a day (BID) | ORAL | 0 refills | Status: DC
Start: 2022-12-15 — End: 2023-04-11
  Filled 2022-12-15 – 2023-02-18 (×2): qty 180, 90d supply, fill #0

## 2022-12-15 MED ORDER — METFORMIN HCL 1000 MG PO TABS
1000.0000 mg | ORAL_TABLET | Freq: Two times a day (BID) | ORAL | 0 refills | Status: DC
  Filled 2022-12-15 – 2023-02-02 (×2): qty 180, 90d supply, fill #0

## 2022-12-18 ENCOUNTER — Encounter: Payer: Self-pay | Admitting: Family Medicine

## 2022-12-20 ENCOUNTER — Telehealth: Payer: Commercial Managed Care - PPO | Admitting: Family Medicine

## 2022-12-27 ENCOUNTER — Other Ambulatory Visit: Payer: Self-pay

## 2022-12-27 ENCOUNTER — Encounter: Payer: Self-pay | Admitting: Family Medicine

## 2022-12-27 ENCOUNTER — Other Ambulatory Visit (HOSPITAL_COMMUNITY): Payer: Self-pay

## 2022-12-28 ENCOUNTER — Other Ambulatory Visit: Payer: Self-pay

## 2022-12-31 ENCOUNTER — Telehealth: Payer: Commercial Managed Care - PPO | Admitting: Family Medicine

## 2023-01-03 ENCOUNTER — Other Ambulatory Visit: Payer: Self-pay

## 2023-01-03 ENCOUNTER — Other Ambulatory Visit: Payer: Self-pay | Admitting: Family Medicine

## 2023-01-03 ENCOUNTER — Other Ambulatory Visit (HOSPITAL_COMMUNITY): Payer: Self-pay

## 2023-01-03 DIAGNOSIS — I10 Essential (primary) hypertension: Secondary | ICD-10-CM

## 2023-01-03 DIAGNOSIS — R600 Localized edema: Secondary | ICD-10-CM

## 2023-01-04 ENCOUNTER — Other Ambulatory Visit (HOSPITAL_COMMUNITY): Payer: Self-pay

## 2023-01-04 ENCOUNTER — Other Ambulatory Visit: Payer: Self-pay

## 2023-01-04 MED ORDER — HYDROCHLOROTHIAZIDE 12.5 MG PO TABS
12.5000 mg | ORAL_TABLET | Freq: Every day | ORAL | 0 refills | Status: DC
Start: 2023-01-04 — End: 2023-04-11
  Filled 2023-01-04 – 2023-03-23 (×3): qty 90, 90d supply, fill #0

## 2023-01-05 ENCOUNTER — Other Ambulatory Visit: Payer: Self-pay

## 2023-01-07 ENCOUNTER — Ambulatory Visit: Payer: Commercial Managed Care - PPO | Admitting: Family Medicine

## 2023-01-14 ENCOUNTER — Other Ambulatory Visit: Payer: Self-pay | Admitting: Family Medicine

## 2023-01-14 ENCOUNTER — Other Ambulatory Visit: Payer: Commercial Managed Care - PPO

## 2023-01-14 ENCOUNTER — Encounter: Payer: Self-pay | Admitting: Family Medicine

## 2023-01-14 DIAGNOSIS — E113513 Type 2 diabetes mellitus with proliferative diabetic retinopathy with macular edema, bilateral: Secondary | ICD-10-CM

## 2023-01-14 DIAGNOSIS — Z794 Long term (current) use of insulin: Secondary | ICD-10-CM | POA: Diagnosis not present

## 2023-01-14 LAB — BAYER DCA HB A1C WAIVED: HB A1C (BAYER DCA - WAIVED): 7.3 % — ABNORMAL HIGH (ref 4.8–5.6)

## 2023-01-15 LAB — CBC
Hematocrit: 40.2 % (ref 34.0–46.6)
Hemoglobin: 13.2 g/dL (ref 11.1–15.9)
MCH: 31.1 pg (ref 26.6–33.0)
MCHC: 32.8 g/dL (ref 31.5–35.7)
MCV: 95 fL (ref 79–97)
Platelets: 396 10*3/uL (ref 150–450)
RBC: 4.25 x10E6/uL (ref 3.77–5.28)
RDW: 12.4 % (ref 11.7–15.4)
WBC: 6.9 10*3/uL (ref 3.4–10.8)

## 2023-01-15 LAB — CMP14+EGFR
ALT: 17 [IU]/L (ref 0–32)
AST: 16 [IU]/L (ref 0–40)
Albumin: 4.2 g/dL (ref 3.9–4.9)
Alkaline Phosphatase: 70 [IU]/L (ref 44–121)
BUN/Creatinine Ratio: 29 — ABNORMAL HIGH (ref 9–23)
BUN: 19 mg/dL (ref 6–24)
Bilirubin Total: 0.2 mg/dL (ref 0.0–1.2)
CO2: 22 mmol/L (ref 20–29)
Calcium: 10.1 mg/dL (ref 8.7–10.2)
Chloride: 98 mmol/L (ref 96–106)
Creatinine, Ser: 0.65 mg/dL (ref 0.57–1.00)
Globulin, Total: 3 g/dL (ref 1.5–4.5)
Glucose: 220 mg/dL — ABNORMAL HIGH (ref 70–99)
Potassium: 4.5 mmol/L (ref 3.5–5.2)
Sodium: 138 mmol/L (ref 134–144)
Total Protein: 7.2 g/dL (ref 6.0–8.5)
eGFR: 113 mL/min/{1.73_m2} (ref >59)

## 2023-01-15 LAB — LIPID PANEL
Chol/HDL Ratio: 4.7 {ratio} — ABNORMAL HIGH (ref 0.0–4.4)
Cholesterol, Total: 163 mg/dL (ref 100–199)
HDL: 35 mg/dL — ABNORMAL LOW (ref >39)
LDL Chol Calc (NIH): 74 mg/dL (ref 0–99)
Triglycerides: 339 mg/dL — ABNORMAL HIGH (ref 0–149)
VLDL Cholesterol Cal: 54 mg/dL — ABNORMAL HIGH (ref 5–40)

## 2023-01-15 LAB — VITAMIN D 25 HYDROXY (VIT D DEFICIENCY, FRACTURES): Vit D, 25-Hydroxy: 31.7 ng/mL (ref 30.0–100.0)

## 2023-01-17 ENCOUNTER — Encounter: Payer: Self-pay | Admitting: Family Medicine

## 2023-01-18 ENCOUNTER — Ambulatory Visit: Payer: Commercial Managed Care - PPO | Attending: Cardiology | Admitting: Cardiology

## 2023-01-18 ENCOUNTER — Encounter: Payer: Self-pay | Admitting: Cardiology

## 2023-01-18 VITALS — BP 128/80 | HR 92 | Ht 65.0 in | Wt 279.2 lb

## 2023-01-18 DIAGNOSIS — I10 Essential (primary) hypertension: Secondary | ICD-10-CM

## 2023-01-18 DIAGNOSIS — R000 Tachycardia, unspecified: Secondary | ICD-10-CM | POA: Diagnosis not present

## 2023-01-18 DIAGNOSIS — E1165 Type 2 diabetes mellitus with hyperglycemia: Secondary | ICD-10-CM

## 2023-01-18 NOTE — Progress Notes (Signed)
    Cardiology Office Note  Date: 01/18/2023   ID: DALI KRANER, DOB 05/15/1980, MRN [id number]  History of Present Illness: Amy Ball is a 42 y.o. female last seen in January 2023.  She is here for a routine visit.  Reports no exertional chest pain, stable NYHA class I dyspnea, no palpitations or syncope.  I reviewed her medications, she continues to follow regularly with PCP.  She has lost about 60 pounds.  At present on [facility] with plan to start Jardiance along with metformin, possibly come off insulin eventually.  Her most recent hemoglobin A1c was 7.3%.  She has had proteinuria documented and is also on lisinopril along with HCTZ for treatment of blood pressure.  ECG today shows normal sinus rhythm.  Physical Exam: VS:  BP 128/80   Pulse 92   Ht 5\' 5"  (1.651 m)   Wt 279 lb 3.2 oz (126.6 kg)   SpO2 98%   BMI 46.46 kg/m , BMI Body mass index is 46.46 kg/m.  Wt Readings from Last 3 Encounters:  01/18/23 279 lb 3.2 oz (126.6 kg)  08/06/22 273 lb (123.8 kg)  05/03/22 282 lb (127.9 kg)    General: Patient appears comfortable at rest. HEENT: Conjunctiva and lids normal. Neck: Supple, no elevated JVP or carotid bruits. Lungs: Clear to auscultation, nonlabored breathing at rest. Cardiac: Regular rate and rhythm, no S3 or significant systolic murmur, no pericardial rub. Extremities: No pitting edema.  ECG:  An ECG dated 04/09/2021 was personally reviewed today and demonstrated:  Sinus rhythm.  Labwork: 01/14/2023: ALT 17; AST 16; BUN 19; Creatinine, Ser 0.65; Hemoglobin 13.2; Platelets 396; Potassium 4.5; Sodium 138     Component Value Date/Time   CHOL 163 01/14/2023 0910   TRIG 339 (H) 01/14/2023 0910   HDL 35 (L) 01/14/2023 0910   CHOLHDL 4.7 (H) 01/14/2023 0910   LDLCALC 74 01/14/2023 0910   Other Studies Reviewed Today:  No interval cardiac testing for review today.  Assessment and Plan:  1.  Primary hypertension.  Continue current treatment plan  including lisinopril and HCTZ.  2.  Type 2 diabetes mellitus.  Recent Hemoglobin A1c 7.3%.  She is now on Mounjaro, metformin, and insulin with plan to start Jardiance.  He has lost a significant amount of weight since last year, also increasing her activity.  Could consider addition of statin for further risk reduction, her LDL was 74 most recently.  Disposition:  Follow up  1 year.  Signed, Jonelle Sidle, M.D., F.A.C.C.  [facility] at [facility]

## 2023-01-18 NOTE — Patient Instructions (Addendum)

## 2023-01-24 LAB — HM DIABETES EYE EXAM

## 2023-01-26 ENCOUNTER — Other Ambulatory Visit: Payer: Self-pay

## 2023-01-26 ENCOUNTER — Ambulatory Visit: Payer: Commercial Managed Care - PPO | Admitting: Family Medicine

## 2023-01-26 VITALS — BP 123/80 | HR 94 | Temp 98.6°F | Ht 65.0 in | Wt 266.0 lb

## 2023-01-26 DIAGNOSIS — I152 Hypertension secondary to endocrine disorders: Secondary | ICD-10-CM | POA: Diagnosis not present

## 2023-01-26 DIAGNOSIS — Z114 Encounter for screening for human immunodeficiency virus [HIV]: Secondary | ICD-10-CM

## 2023-01-26 DIAGNOSIS — E1159 Type 2 diabetes mellitus with other circulatory complications: Secondary | ICD-10-CM | POA: Diagnosis not present

## 2023-01-26 DIAGNOSIS — E113513 Type 2 diabetes mellitus with proliferative diabetic retinopathy with macular edema, bilateral: Secondary | ICD-10-CM

## 2023-01-26 DIAGNOSIS — Z794 Long term (current) use of insulin: Secondary | ICD-10-CM

## 2023-01-26 DIAGNOSIS — R5383 Other fatigue: Secondary | ICD-10-CM | POA: Diagnosis not present

## 2023-01-26 DIAGNOSIS — M25512 Pain in left shoulder: Secondary | ICD-10-CM | POA: Diagnosis not present

## 2023-01-26 DIAGNOSIS — Z1159 Encounter for screening for other viral diseases: Secondary | ICD-10-CM | POA: Diagnosis not present

## 2023-01-26 NOTE — Progress Notes (Signed)
Subjective: CC:DM PCP: Amy Ip, DO [id number] Amy Ball is a 42 y.o. female presenting to clinic today for:  1. Type 2 Diabetes with hypertension, hyperlipidemia:  Reports compliance with all medications.  She has not started back exercising again yet though she plans to do this soon.  She has not started back on Jardiance but is going to.  She had COVID and was nervous to start.  Diabetes Health Maintenance Due  Topic Date Due   OPHTHALMOLOGY EXAM  05/04/2022   HEMOGLOBIN A1C  07/15/2023   FOOT EXAM  01/26/2024    Last A1c:  Lab Results  Component Value Date   HGBA1C 7.3 (Amy) 01/14/2023    ROS: No further GI issues.  No chest pain, shortness of breath or dizziness reported.  Continues to see her eye doctor for pressures.  Off eyedrops now and only seeing him every few months now for injection therapy.    2.  Left shoulder pain She reports that [place] the [place] and injured her left shoulder.  She is been working with a massage therapist but thinks that she might want to go ahead and do physical therapy.  ROS: Per HPI  Allergies  Allergen Reactions   Amoxicillin-Pot Clavulanate Anaphylaxis   Covid-19 (Mrna) Vaccine Anaphylaxis   Covid-19 Mrna Vacc (Moderna) Anaphylaxis   Macrobid [Nitrofurantoin Macrocrystal] Swelling and Rash   Basaglar Kwikpen [Insulin Glargine] Swelling   Penicillins    Avelox [Moxifloxacin Hcl In Nacl] Rash   Past Medical History:  Diagnosis Date   Environmental allergies    GAD (generalized anxiety disorder) 12/05/2018   GERD (gastroesophageal reflux disease)    Hypertension    Type 2 diabetes mellitus (HCC)     Current Outpatient Medications:    Ascorbic Acid (VITAMIN C) 1000 MG tablet, Take 1,000 mg by mouth daily., Disp: , Rfl:    busPIRone (BUSPAR) 10 MG tablet, Take 1 tablet (10 mg total) by mouth 3 (three) times daily., Disp: 90 tablet, Rfl: 2   cetirizine (ZYRTEC) 10 MG tablet, Take 1 tablet (10 mg total) by mouth  daily., Disp: 90 tablet, Rfl: 3   Continuous Glucose Sensor (DEXCOM G7 SENSOR) MISC, Use to check blood sugar as directed. Change every 10 days, Disp: 9 each, Rfl: 4   empagliflozin (JARDIANCE) 10 MG TABS tablet, Take 1 tablet (10 mg total) by mouth daily., Disp: 90 tablet, Rfl: 4   EPINEPHrine 0.3 mg/0.3 mL IJ SOAJ injection, Inject 0.3 mLs (0.3 mg total) into the muscle as needed for anaphylaxis., Disp: 1 each, Rfl: 0   fluticasone (FLONASE) 50 MCG/ACT nasal spray, Place 1 spray into both nostrils daily., Disp: 48 g, Rfl: 1   glucose blood (FREESTYLE LITE) test strip, Use 1 strip to test blood sugar 3 (three) times daily., Disp: 300 strip, Rfl: 3   glucose blood test strip, Use three times daily to check blood sugar as directed, Disp: 300 each, Rfl: 3   glucose blood test strip, Use one strip to test blood sugar three times daily, Disp: 300 strip, Rfl: 3   hydrochlorothiazide (HYDRODIURIL) 12.5 MG tablet, Take 1 tablet (12.5 mg total) by mouth daily., Disp: 90 tablet, Rfl: 0   ibuprofen (ADVIL) 800 MG tablet, Take 1 tablet (800 mg total) by mouth every 8 (eight) hours as needed., Disp: 30 tablet, Rfl: 0   insulin regular (HUMULIN R) 100 units/mL injection, Inject 0.2 mLs (20 Units total) into the skin 3 (three) times daily before meals., Disp: 60  mL, Rfl: 3   Insulin Syringe-Needle U-100 (TECHLITE INSULIN SYRINGE) 31G X 15/64" 0.3 ML MISC, Use as directed 4 times a day to inject insulin, Disp: 400 each, Rfl: 3   Insulin Syringe-Needle U-100 (TECHLITE INSULIN SYRINGE) 31G X 15/64" 0.3 ML MISC, Use to inject insulin 4 times a day as directed, Disp: 400 each, Rfl: 4   Insulin Syringe-Needle U-100 31G X 5/16" 0.3 ML MISC, USE TO ADMINISTER INSULIN 4 TIMES DAILY AS DIRECTED, Disp: 400 each, Rfl: 3   Lancets (FREESTYLE) lancets, Test blood sugars up to 3 times daily, Disp: 300 each, Rfl: 3   latanoprost (XALATAN) 0.005 % ophthalmic solution, Place 1 drop into both eyes at bedtime., Disp: 2.5 mL, Rfl: 4    lisinopril (ZESTRIL) 10 MG tablet, Take 1 tablet (10 mg total) by mouth daily., Disp: 90 tablet, Rfl: 1   metFORMIN (GLUCOPHAGE) 1000 MG tablet, Take 1 tablet by mouth 2 times daily with a meal., Disp: 180 tablet, Rfl: 0   NOVOFINE 32G X 6 MM MISC, , Disp: , Rfl:    ondansetron (ZOFRAN-ODT) 4 MG disintegrating tablet, Dissolve 1 tablet (4 mg total) by mouth every 8 (eight) hours as needed for nausea or vomiting., Disp: 20 tablet, Rfl: 0   pantoprazole (PROTONIX) 40 MG tablet, Take 1 tablet (40 mg) by mouth 2 times daily before a meal., Disp: 180 tablet, Rfl: 0   Prenatal Vit-Fe Fumarate-FA (PRENATAL VITAMIN PO), Take 1 tablet by mouth daily., Disp: , Rfl:    tirzepatide (MOUNJARO) 15 MG/0.5ML Pen, Inject 15 mg into the skin once a week., Disp: 6 mL, Rfl: 4   TRUEPLUS INSULIN SYRINGE 30G X 5/16" 1 ML MISC, USE TO INJECT INSULIN 4 TIMES DAILY, Disp: 300 each, Rfl: 3 Social History   Socioeconomic History   Marital status: Married    Spouse name: Therapist, nutritional   Number of children: Not on file   Years of education: 18   Highest education level: Master's degree (e.g., MA, MS, MEng, MEd, MSW, MBA)  Occupational History   Occupation: third grade teacher  Tobacco Use   Smoking status: Never   Smokeless tobacco: Never  Vaping Use   Vaping status: Never Used  Substance and Sexual Activity   Alcohol use: Yes    Comment: occasionally   Drug use: No   Sexual activity: Yes  Other Topics Concern   Not on file  Social History Narrative   Engaged to be married December 8 to [place]   Social Determinants of Health   Financial Resource Strain: Low Risk  (01/26/2023)   Overall Financial Resource Strain (CARDIA)    Difficulty of Paying Living Expenses: Not very hard  Food Insecurity: No Food Insecurity (01/26/2023)   Hunger Vital Sign    Worried About Running Out of Food in the Last Year: Never true    Ran Out of Food in the Last Year: Never true  Transportation Needs: No Transportation Needs  (01/26/2023)   PRAPARE - Administrator, Civil Service (Medical): No    Lack of Transportation (Non-Medical): No  Physical Activity: Insufficiently Active (01/26/2023)   Exercise Vital Sign    Days of Exercise per Week: 3 days    Minutes of Exercise per Session: 30 min  Stress: No Stress Concern Present (01/26/2023)   [employer] of Occupational Health - Occupational Stress Questionnaire    Feeling of Stress : Only a little  Social Connections: Socially Integrated (01/26/2023)   Social Connection and Isolation Panel [NHANES]  Frequency of Communication with Friends and Family: More than three times a week    Frequency of Social Gatherings with Friends and Family: More than three times a week    Attends Religious Services: More than 4 times per year    Active Member of [employer] or Organizations: Yes    Attends Banker Meetings: More than 4 times per year    Marital Status: Married  Catering manager Violence: Unknown (07/09/2021)   Received from [facility]   HITS    Physically Hurt: Not on file    Insult or Talk Down To: Not on file    Threaten Physical Harm: Not on file    Scream or Curse: Not on file   Family History  Problem Relation Age of Onset   Birth defects Mother        one hand small   Arthritis Father    Diabetes Father    Heart disease Father 39       heart attack   Psoriasis Father    Psoriasis Brother    Stroke Paternal Grandmother        age 36   Cancer Paternal Grandfather     Objective: Office vital signs reviewed. BP 123/80   Pulse 94   Temp 98.6 F (37 C)   Ht 5\' 5"  (1.651 m)   Wt 266 lb (120.7 kg)   SpO2 100%   BMI 44.26 kg/m   Physical Examination:  General: Awake, alert, well appearing female, No acute distress HEENT: sclera white, MMM Cardio: regular rate and rhythm, S1S2 heard, no murmurs appreciated Pulm: clear to auscultation bilaterally, no wheezes, rhonchi or rales; normal work of breathing on room  air MSK: Pain with abduction and flexion of the left shoulder.  Diabetic Foot Exam - Simple   Simple Foot Form Diabetic Foot exam was performed with the following findings: Yes 01/26/2023  4:33 PM  Visual Inspection No deformities, no ulcerations, no other skin breakdown bilaterally: Yes Sensation Testing Intact to touch and monofilament testing bilaterally: Yes Pulse Check Posterior Tibialis and Dorsalis pulse intact bilaterally: Yes Comments    The 10-year ASCVD risk score (Arnett DK, et al., 2019) is: 2.3%   Values used to calculate the score:     Age: 37 years     Sex: Female     Is Non-Hispanic African American: No     Diabetic: Yes     Tobacco smoker: No     Systolic Blood Pressure: 123 mmHg     Is BP treated: Yes     HDL Cholesterol: 35 mg/dL     Total Cholesterol: 163 mg/dL   Assessment/ Plan: 42 y.o. female   Type 2 diabetes mellitus with both eyes affected by proliferative retinopathy and macular edema, with long-term current use of insulin (HCC) - Plan: Microalbumin / creatinine urine ratio, Bayer DCA Hb A1c Waived, Lipid panel  Hypertension associated with diabetes (HCC) - Plan: Basic metabolic panel  Screening for HIV (human immunodeficiency virus) - Plan: HIV Antibody (routine testing w rflx)  Need for hepatitis C screening test - Plan: Hepatitis C Antibody  Fatigue, unspecified type - Plan: Vitamin B12, TSH, T4, free  Acute pain of left shoulder - Plan: Ambulatory referral to Physical Therapy   Sugar is very close to goal with A1c dropping to 7.3.  I think with addition of Jardiance and increasing physical exercise she will meet goal by our next visit.  Hopefully we can start tapering off insulin at  that point  Future orders placed for A1c, urine microalbumin, BMP, repeat lipid panel.  I again reiterated need for statin therapy and type 2 diabetes but she wants to see if she can get sugar and cholesterol down with diet  I have ordered vitamin B12 and  thyroid levels given reports of fatigue  Referral for physical therapy placed for left shoulder.  Suspect tendinitis  Amy Ip, DO [facility] [phone]

## 2023-01-28 ENCOUNTER — Other Ambulatory Visit: Payer: Self-pay | Admitting: Family Medicine

## 2023-01-28 DIAGNOSIS — M25512 Pain in left shoulder: Secondary | ICD-10-CM

## 2023-02-03 ENCOUNTER — Other Ambulatory Visit (HOSPITAL_COMMUNITY): Payer: Self-pay

## 2023-02-03 ENCOUNTER — Other Ambulatory Visit: Payer: Self-pay

## 2023-02-10 ENCOUNTER — Other Ambulatory Visit: Payer: Self-pay

## 2023-02-18 ENCOUNTER — Other Ambulatory Visit (HOSPITAL_COMMUNITY): Payer: Self-pay

## 2023-02-19 ENCOUNTER — Other Ambulatory Visit (HOSPITAL_COMMUNITY): Payer: Self-pay

## 2023-02-21 ENCOUNTER — Other Ambulatory Visit: Payer: Self-pay

## 2023-02-21 ENCOUNTER — Other Ambulatory Visit (HOSPITAL_COMMUNITY): Payer: Self-pay

## 2023-02-23 ENCOUNTER — Other Ambulatory Visit (HOSPITAL_COMMUNITY): Payer: Self-pay

## 2023-03-06 ENCOUNTER — Other Ambulatory Visit: Payer: Self-pay | Admitting: Family Medicine

## 2023-03-06 ENCOUNTER — Encounter: Payer: Self-pay | Admitting: Family Medicine

## 2023-03-06 DIAGNOSIS — E1159 Type 2 diabetes mellitus with other circulatory complications: Secondary | ICD-10-CM

## 2023-03-07 ENCOUNTER — Other Ambulatory Visit (HOSPITAL_COMMUNITY): Payer: Self-pay

## 2023-03-07 ENCOUNTER — Other Ambulatory Visit: Payer: Self-pay

## 2023-03-07 MED ORDER — BUSPIRONE HCL 10 MG PO TABS
10.0000 mg | ORAL_TABLET | Freq: Three times a day (TID) | ORAL | 3 refills | Status: DC
  Filled 2023-03-07: qty 270, 90d supply, fill #0
  Filled 2023-04-11 – 2023-05-17 (×2): qty 270, 90d supply, fill #1
  Filled 2023-06-19 – 2023-08-08 (×4): qty 270, 90d supply, fill #2
  Filled 2023-08-21 – 2023-11-20 (×5): qty 270, 90d supply, fill #3

## 2023-03-07 MED ORDER — LISINOPRIL 10 MG PO TABS
10.0000 mg | ORAL_TABLET | Freq: Every day | ORAL | 0 refills | Status: DC
  Filled 2023-03-07 – 2023-04-11 (×2): qty 90, 90d supply, fill #0

## 2023-03-21 ENCOUNTER — Other Ambulatory Visit: Payer: Self-pay

## 2023-03-23 ENCOUNTER — Other Ambulatory Visit (HOSPITAL_COMMUNITY): Payer: Self-pay

## 2023-03-31 ENCOUNTER — Encounter: Payer: Self-pay | Admitting: Family Medicine

## 2023-03-31 ENCOUNTER — Telehealth: Payer: Commercial Managed Care - PPO | Admitting: Family

## 2023-03-31 ENCOUNTER — Encounter: Payer: Self-pay | Admitting: Family

## 2023-03-31 DIAGNOSIS — J0100 Acute maxillary sinusitis, unspecified: Secondary | ICD-10-CM | POA: Diagnosis not present

## 2023-03-31 MED ORDER — DOXYCYCLINE HYCLATE 100 MG PO TABS: 100.0000 mg | ORAL_TABLET | Freq: Two times a day (BID) | ORAL | 0 refills | Status: DC

## 2023-03-31 NOTE — Progress Notes (Signed)
Virtual Visit Consent   Amy Ball, you are scheduled for a virtual visit with a  provider today. Just as with appointments in the office, your consent must be obtained to participate. Your consent will be active for this visit and any virtual visit you may have with one of our providers in the next 365 days. If you have a MyChart account, a copy of this consent can be sent to you electronically.  As this is a virtual visit, video technology does not allow for your provider to perform a traditional examination. This may limit your provider's ability to fully assess your condition. If your provider identifies any concerns that need to be evaluated in person or the need to arrange testing (such as labs, EKG, etc.), we will make arrangements to do so. Although advances in technology are sophisticated, we cannot ensure that it will always work on either your end or our end. If the connection with a video visit is poor, the visit may have to be switched to a telephone visit. With either a video or telephone visit, we are not always able to ensure that we have a secure connection.  By engaging in this virtual visit, you consent to the provision of healthcare and authorize for your insurance to be billed (if applicable) for the services provided during this visit. Depending on your insurance coverage, you may receive a charge related to this service.  I need to obtain your verbal consent now. Are you willing to proceed with your visit today? Amy Ball has provided verbal consent on 03/31/2023 for a virtual visit (video or telephone). Jannifer Rodney, FNP  Date: 03/31/2023 3:00 PM  Virtual Visit via Video Note   I, Jannifer Rodney, [name]  ([id number], 07/16/1980) on 03/31/23 at  2:55 PM EST by a video-enabled telemedicine application and verified that I am speaking with the correct person using two identifiers.  Location: Patient: Virtual Visit Location  Patient: Home Provider: Virtual Visit Location Provider: Home Office   I discussed the limitations of evaluation and management by telemedicine and the availability of in person appointments. The patient expressed understanding and agreed to proceed.    History of Present Illness: Amy Ball is a 42 y.o. who identifies as a female who was assigned female at birth, and is being seen today for sinus congestion. She took a home COVID test that was negative.   HPI: Sinusitis This is a new problem. The current episode started in the past 7 days. The problem has been gradually worsening since onset. The pain is mild. Associated symptoms include congestion, coughing (improved), headaches, sinus pressure, sneezing and a sore throat. Pertinent negatives include no ear pain or shortness of breath. Past treatments include oral decongestants and acetaminophen. The treatment provided mild relief.    Problems:  Patient Active Problem List   Diagnosis Date Noted   Hypercortisolemia (HCC) 03/14/2020   GAD (generalized anxiety disorder) 12/05/2018   Gastroesophageal reflux disease without esophagitis 02/20/2018   Intertriginous candidiasis 02/20/2018   Attempting to conceive 02/20/2018   Obesity, Class III, BMI 40-49.9 (morbid obesity) (HCC) 02/15/2017   Controlled type 2 diabetes mellitus without complication, without long-term current use of insulin (HCC) 02/15/2017   Hypertension associated with diabetes (HCC) 02/15/2017   Environmental allergies 02/15/2017   Knee pain, right 10/01/2010   Gait abnormality 10/01/2010    Allergies:  Allergies  Allergen Reactions   Amoxicillin-Pot Clavulanate Anaphylaxis   Covid-19 (Mrna) Vaccine Anaphylaxis  Covid-19 Mrna Vacc (Moderna) Anaphylaxis   Macrobid [Nitrofurantoin Macrocrystal] Swelling and Rash   Basaglar Kwikpen [Insulin Glargine] Swelling   Penicillins    Avelox [Moxifloxacin Hcl In Nacl] Rash   Medications:  Current Outpatient  Medications:    doxycycline (VIBRA-TABS) 100 MG tablet, Take 1 tablet (100 mg total) by mouth 2 (two) times daily., Disp: 20 tablet, Rfl: 0   Ascorbic Acid (VITAMIN C) 1000 MG tablet, Take 1,000 mg by mouth daily., Disp: , Rfl:    busPIRone (BUSPAR) 10 MG tablet, Take 1 tablet (10 mg total) by mouth 3 (three) times daily., Disp: 270 tablet, Rfl: 3   cetirizine (ZYRTEC) 10 MG tablet, Take 1 tablet (10 mg total) by mouth daily., Disp: 90 tablet, Rfl: 3   Continuous Glucose Sensor (DEXCOM G7 SENSOR) MISC, Use to check blood sugar as directed. Change every 10 days, Disp: 9 each, Rfl: 4   empagliflozin (JARDIANCE) 10 MG TABS tablet, Take 1 tablet (10 mg total) by mouth daily., Disp: 90 tablet, Rfl: 4   EPINEPHrine 0.3 mg/0.3 mL IJ SOAJ injection, Inject 0.3 mLs (0.3 mg total) into the muscle as needed for anaphylaxis., Disp: 1 each, Rfl: 0   fluticasone (FLONASE) 50 MCG/ACT nasal spray, Place 1 spray into both nostrils daily., Disp: 48 g, Rfl: 1   glucose blood (FREESTYLE LITE) test strip, Use 1 strip to test blood sugar 3 (three) times daily., Disp: 300 strip, Rfl: 3   glucose blood test strip, Use three times daily to check blood sugar as directed, Disp: 300 each, Rfl: 3   glucose blood test strip, Use one strip to test blood sugar three times daily, Disp: 300 strip, Rfl: 3   hydrochlorothiazide (HYDRODIURIL) 12.5 MG tablet, Take 1 tablet (12.5 mg total) by mouth daily., Disp: 90 tablet, Rfl: 0   ibuprofen (ADVIL) 800 MG tablet, Take 1 tablet (800 mg total) by mouth every 8 (eight) hours as needed., Disp: 30 tablet, Rfl: 0   insulin regular (HUMULIN R) 100 units/mL injection, Inject 0.2 mLs (20 Units total) into the skin 3 (three) times daily before meals., Disp: 60 mL, Rfl: 3   Insulin Syringe-Needle U-100 (TECHLITE INSULIN SYRINGE) 31G X 15/64" 0.3 ML MISC, Use as directed 4 times a day to inject insulin, Disp: 400 each, Rfl: 3   Insulin Syringe-Needle U-100 (TECHLITE INSULIN SYRINGE) 31G X 15/64" 0.3  ML MISC, Use to inject insulin 4 times a day as directed, Disp: 400 each, Rfl: 4   Insulin Syringe-Needle U-100 31G X 5/16" 0.3 ML MISC, USE TO ADMINISTER INSULIN 4 TIMES DAILY AS DIRECTED, Disp: 400 each, Rfl: 3   Lancets (FREESTYLE) lancets, Test blood sugars up to 3 times daily, Disp: 300 each, Rfl: 3   latanoprost (XALATAN) 0.005 % ophthalmic solution, Place 1 drop into both eyes at bedtime., Disp: 2.5 mL, Rfl: 4   lisinopril (ZESTRIL) 10 MG tablet, Take 1 tablet (10 mg total) by mouth daily., Disp: 90 tablet, Rfl: 0   metFORMIN (GLUCOPHAGE) 1000 MG tablet, Take 1 tablet by mouth 2 times daily with a meal., Disp: 180 tablet, Rfl: 0   NOVOFINE 32G X 6 MM MISC, , Disp: , Rfl:    ondansetron (ZOFRAN-ODT) 4 MG disintegrating tablet, Dissolve 1 tablet (4 mg total) by mouth every 8 (eight) hours as needed for nausea or vomiting., Disp: 20 tablet, Rfl: 0   pantoprazole (PROTONIX) 40 MG tablet, Take 1 tablet (40 mg) by mouth 2 times daily before a meal., Disp: 180 tablet, Rfl:  0   Prenatal Vit-Fe Fumarate-FA (PRENATAL VITAMIN PO), Take 1 tablet by mouth daily., Disp: , Rfl:    tirzepatide (MOUNJARO) 15 MG/0.5ML Pen, Inject 15 mg into the skin once a week., Disp: 6 mL, Rfl: 4   TRUEPLUS INSULIN SYRINGE 30G X 5/16" 1 ML MISC, USE TO INJECT INSULIN 4 TIMES DAILY, Disp: 300 each, Rfl: 3  Observations/Objective: Patient is well-developed, well-nourished in no acute distress.  Resting comfortably  at home.  Head is normocephalic, atraumatic.  No labored breathing.  Speech is clear and coherent with logical content.  Patient is alert and oriented at baseline.  Maxillary sinus pressure   Assessment and Plan: 1. Acute non-recurrent maxillary sinusitis (Primary) - doxycycline (VIBRA-TABS) 100 MG tablet; Take 1 tablet (100 mg total) by mouth 2 (two) times daily.  Dispense: 20 tablet; Refill: 0  - Take meds as prescribed - Use a cool mist humidifier  -Use saline nose sprays frequently -Force  fluids -For any cough or congestion  Use plain Mucinex- regular strength or max strength is fine -For fever or aces or pains- take tylenol or ibuprofen. -Throat lozenges if help -Pt will hold off on antibiotics  for the next few days and if symptoms worsen she will start doxycyline  -Follow up if symptoms worsen or do not improve   Follow Up Instructions: I discussed the assessment and treatment plan with the patient. The patient was provided an opportunity to ask questions and all were answered. The patient agreed with the plan and demonstrated an understanding of the instructions.  A copy of instructions were sent to the patient via MyChart unless otherwise noted below.     The patient was advised to call back or seek an in-person evaluation if the symptoms worsen or if the condition fails to improve as anticipated.    Jannifer Rodney, FNP

## 2023-03-31 NOTE — Patient Instructions (Signed)

## 2023-04-11 ENCOUNTER — Other Ambulatory Visit: Payer: Self-pay

## 2023-04-11 ENCOUNTER — Other Ambulatory Visit (HOSPITAL_COMMUNITY): Payer: Self-pay

## 2023-04-11 ENCOUNTER — Other Ambulatory Visit: Payer: Self-pay | Admitting: Family Medicine

## 2023-04-11 DIAGNOSIS — K219 Gastro-esophageal reflux disease without esophagitis: Secondary | ICD-10-CM

## 2023-04-11 DIAGNOSIS — I10 Essential (primary) hypertension: Secondary | ICD-10-CM

## 2023-04-11 DIAGNOSIS — R600 Localized edema: Secondary | ICD-10-CM

## 2023-04-11 MED ORDER — HYDROCHLOROTHIAZIDE 12.5 MG PO TABS
12.5000 mg | ORAL_TABLET | Freq: Every day | ORAL | 0 refills | Status: DC
  Filled 2023-04-11 – 2023-06-19 (×2): qty 90, 90d supply, fill #0

## 2023-04-11 MED ORDER — PANTOPRAZOLE SODIUM 40 MG PO TBEC
40.0000 mg | DELAYED_RELEASE_TABLET | Freq: Two times a day (BID) | ORAL | 0 refills | Status: DC
  Filled 2023-04-11 – 2023-06-13 (×2): qty 180, 90d supply, fill #0

## 2023-04-11 MED ORDER — METFORMIN HCL 1000 MG PO TABS
1000.0000 mg | ORAL_TABLET | Freq: Two times a day (BID) | ORAL | 0 refills | Status: DC
  Filled 2023-04-11 – 2023-05-05 (×2): qty 180, 90d supply, fill #0

## 2023-04-12 ENCOUNTER — Other Ambulatory Visit: Payer: Self-pay

## 2023-04-21 ENCOUNTER — Other Ambulatory Visit (HOSPITAL_COMMUNITY): Payer: Self-pay

## 2023-04-26 ENCOUNTER — Other Ambulatory Visit: Payer: Self-pay

## 2023-04-26 ENCOUNTER — Other Ambulatory Visit (HOSPITAL_COMMUNITY): Payer: Self-pay

## 2023-04-26 DIAGNOSIS — E113513 Type 2 diabetes mellitus with proliferative diabetic retinopathy with macular edema, bilateral: Secondary | ICD-10-CM | POA: Diagnosis not present

## 2023-04-26 MED ORDER — INSULIN PEN NEEDLE 31G X 6 MM MISC
4 refills | Status: DC
  Filled 2023-04-26: qty 300, 75d supply, fill #0

## 2023-04-28 ENCOUNTER — Other Ambulatory Visit (HOSPITAL_COMMUNITY): Payer: Self-pay

## 2023-04-28 MED ORDER — INSULIN SYRINGE-NEEDLE U-100 31G X 15/64" 0.3 ML MISC
4 refills | Status: DC
  Filled 2023-04-28: qty 300, 75d supply, fill #0
  Filled 2023-07-29: qty 300, 75d supply, fill #1
  Filled 2024-01-15: qty 300, 75d supply, fill #2

## 2023-04-29 ENCOUNTER — Other Ambulatory Visit: Payer: Self-pay

## 2023-04-29 ENCOUNTER — Other Ambulatory Visit (HOSPITAL_COMMUNITY): Payer: Self-pay

## 2023-05-02 ENCOUNTER — Ambulatory Visit: Payer: Commercial Managed Care - PPO | Admitting: Family Medicine

## 2023-05-04 ENCOUNTER — Other Ambulatory Visit (HOSPITAL_COMMUNITY): Payer: Self-pay

## 2023-05-05 ENCOUNTER — Other Ambulatory Visit: Payer: Self-pay

## 2023-05-05 ENCOUNTER — Other Ambulatory Visit (HOSPITAL_COMMUNITY): Payer: Self-pay

## 2023-05-06 ENCOUNTER — Other Ambulatory Visit (HOSPITAL_COMMUNITY): Payer: Self-pay

## 2023-05-17 ENCOUNTER — Other Ambulatory Visit: Payer: Self-pay | Admitting: Family Medicine

## 2023-05-17 ENCOUNTER — Other Ambulatory Visit: Payer: Self-pay

## 2023-05-17 ENCOUNTER — Other Ambulatory Visit (HOSPITAL_COMMUNITY): Payer: Self-pay

## 2023-05-17 MED ORDER — METFORMIN HCL 1000 MG PO TABS
1000.0000 mg | ORAL_TABLET | Freq: Two times a day (BID) | ORAL | 0 refills | Status: DC
  Filled 2023-05-17: qty 180, 90d supply, fill #0

## 2023-05-20 ENCOUNTER — Ambulatory Visit: Payer: Commercial Managed Care - PPO | Admitting: Family Medicine

## 2023-05-24 ENCOUNTER — Ambulatory Visit: Payer: Commercial Managed Care - PPO | Admitting: Family Medicine

## 2023-05-24 ENCOUNTER — Encounter: Payer: Self-pay | Admitting: Family Medicine

## 2023-05-24 VITALS — BP 130/85 | HR 99 | Temp 98.2°F | Ht 65.0 in | Wt 263.8 lb

## 2023-05-24 DIAGNOSIS — Z1159 Encounter for screening for other viral diseases: Secondary | ICD-10-CM

## 2023-05-24 DIAGNOSIS — Z114 Encounter for screening for human immunodeficiency virus [HIV]: Secondary | ICD-10-CM | POA: Diagnosis not present

## 2023-05-24 DIAGNOSIS — R102 Pelvic and perineal pain: Secondary | ICD-10-CM | POA: Diagnosis not present

## 2023-05-24 DIAGNOSIS — R5383 Other fatigue: Secondary | ICD-10-CM

## 2023-05-24 DIAGNOSIS — Z794 Long term (current) use of insulin: Secondary | ICD-10-CM | POA: Diagnosis not present

## 2023-05-24 DIAGNOSIS — E1159 Type 2 diabetes mellitus with other circulatory complications: Secondary | ICD-10-CM

## 2023-05-24 DIAGNOSIS — I152 Hypertension secondary to endocrine disorders: Secondary | ICD-10-CM

## 2023-05-24 DIAGNOSIS — E113513 Type 2 diabetes mellitus with proliferative diabetic retinopathy with macular edema, bilateral: Secondary | ICD-10-CM

## 2023-05-24 LAB — URINALYSIS, ROUTINE W REFLEX MICROSCOPIC
Bilirubin, UA: NEGATIVE
Ketones, UA: NEGATIVE
Leukocytes,UA: NEGATIVE
Nitrite, UA: NEGATIVE
Protein,UA: NEGATIVE
RBC, UA: NEGATIVE
Specific Gravity, UA: <1.005 — ABNORMAL LOW (ref 1.005–1.030)
Urobilinogen, Ur: 0.2 mg/dL (ref 0.2–1.0)
pH, UA: 5 (ref 5.0–7.5)

## 2023-05-24 LAB — BAYER DCA HB A1C WAIVED: HB A1C (BAYER DCA - WAIVED): 7.7 % — ABNORMAL HIGH (ref 4.8–5.6)

## 2023-05-24 MED ORDER — LISINOPRIL 10 MG PO TABS
10.0000 mg | ORAL_TABLET | Freq: Every day | ORAL | 4 refills | Status: DC
  Filled 2023-05-24 – 2023-07-04 (×3): qty 90, 90d supply, fill #0
  Filled 2023-07-29 – 2023-10-01 (×3): qty 90, 90d supply, fill #1
  Filled 2023-11-20 – 2024-01-15 (×3): qty 90, 90d supply, fill #2

## 2023-05-24 MED ORDER — DEXCOM G7 SENSOR MISC
4 refills | Status: AC
  Filled 2023-05-24: qty 9, 90d supply, fill #0

## 2023-05-24 MED ORDER — EMPAGLIFLOZIN 10 MG PO TABS
10.0000 mg | ORAL_TABLET | Freq: Every day | ORAL | 4 refills | Status: DC
  Filled 2023-05-24 – 2023-07-29 (×3): qty 90, 90d supply, fill #0
  Filled 2023-09-07: qty 90, 90d supply, fill #1

## 2023-05-24 MED ORDER — TIRZEPATIDE 15 MG/0.5ML ~~LOC~~ SOAJ
15.0000 mg | SUBCUTANEOUS | 4 refills | Status: DC
  Filled 2023-05-24 – 2023-07-04 (×2): qty 6, 84d supply, fill #0
  Filled 2023-07-29 – 2023-10-01 (×3): qty 6, 84d supply, fill #1
  Filled 2023-11-20 – 2023-12-11 (×2): qty 6, 84d supply, fill #2
  Filled 2024-01-15: qty 6, 84d supply, fill #3

## 2023-05-24 MED ORDER — METFORMIN HCL 1000 MG PO TABS
1000.0000 mg | ORAL_TABLET | Freq: Two times a day (BID) | ORAL | 4 refills | Status: DC
  Filled 2023-05-24 – 2023-07-29 (×4): qty 180, 90d supply, fill #0
  Filled 2023-09-07 – 2023-11-20 (×3): qty 180, 90d supply, fill #1
  Filled 2023-12-11 – 2024-02-14 (×3): qty 180, 90d supply, fill #2

## 2023-05-24 MED ORDER — INSULIN REGULAR HUMAN 100 UNIT/ML IJ SOLN
20.0000 [IU] | Freq: Three times a day (TID) | INTRAMUSCULAR | 3 refills | Status: DC
  Filled 2023-05-24: qty 60, 100d supply, fill #0
  Filled 2023-07-04: qty 50, 84d supply, fill #0

## 2023-05-24 NOTE — Progress Notes (Signed)
Subjective: CC:DM PCP: Raliegh Ip, DO [id number] Amy Ball is a 43 y.o. female presenting to clinic today for:  1. Type 2 Diabetes with hypertension, hyperlipidemia:  Patient is compliant with her medications.  She is down to injecting her insulin just twice per day now.  Blood sugars have been pretty consistently below 120s and she has had no hypoglycemic episodes nor any symptomatic relative hypoglycemia.  She is utilizing the Jardiance and denies any dysuria, hematuria, yeast vaginitis.  No abdominal pain, nausea or vomiting.  Continues to watch her diet pretty closely.   Diabetes Health Maintenance Due  Topic Date Due   HEMOGLOBIN A1C  07/15/2023   OPHTHALMOLOGY EXAM  01/24/2024   FOOT EXAM  01/26/2024    Last A1c:  Lab Results  Component Value Date   HGBA1C 7.3 (Amy) 01/14/2023    ROS: Per HPI  Allergies  Allergen Reactions   Amoxicillin-Pot Clavulanate Anaphylaxis   Covid-19 (Mrna) Vaccine Anaphylaxis   Covid-19 Mrna Vacc (Moderna) Anaphylaxis   Macrobid [Nitrofurantoin Macrocrystal] Swelling and Rash   Basaglar Kwikpen [Insulin Glargine] Swelling   Penicillins    Avelox [Moxifloxacin Hcl In Nacl] Rash   Past Medical History:  Diagnosis Date   Environmental allergies    GAD (generalized anxiety disorder) 12/05/2018   GERD (gastroesophageal reflux disease)    Hypertension    Type 2 diabetes mellitus (HCC)     Current Outpatient Medications:    Ascorbic Acid (VITAMIN C) 1000 MG tablet, Take 1,000 mg by mouth daily., Disp: , Rfl:    busPIRone (BUSPAR) 10 MG tablet, Take 1 tablet (10 mg total) by mouth 3 (three) times daily., Disp: 270 tablet, Rfl: 3   cetirizine (ZYRTEC) 10 MG tablet, Take 1 tablet (10 mg total) by mouth daily., Disp: 90 tablet, Rfl: 3   Continuous Glucose Sensor (DEXCOM G7 SENSOR) MISC, Use to check blood sugar as directed. Change every 10 days, Disp: 9 each, Rfl: 4   empagliflozin (JARDIANCE) 10 MG TABS tablet, Take 1 tablet (10 mg  total) by mouth daily., Disp: 90 tablet, Rfl: 4   fluticasone (FLONASE) 50 MCG/ACT nasal spray, Place 1 spray into both nostrils daily., Disp: 48 g, Rfl: 1   glucose blood (FREESTYLE LITE) test strip, Use 1 strip to test blood sugar 3 (three) times daily., Disp: 300 strip, Rfl: 3   glucose blood test strip, Use three times daily to check blood sugar as directed, Disp: 300 each, Rfl: 3   glucose blood test strip, Use one strip to test blood sugar three times daily, Disp: 300 strip, Rfl: 3   hydrochlorothiazide (HYDRODIURIL) 12.5 MG tablet, Take 1 tablet (12.5 mg total) by mouth daily., Disp: 90 tablet, Rfl: 0   ibuprofen (ADVIL) 800 MG tablet, Take 1 tablet (800 mg total) by mouth every 8 (eight) hours as needed., Disp: 30 tablet, Rfl: 0   insulin regular (HUMULIN R) 100 units/mL injection, Inject 0.2 mLs (20 Units total) into the skin 3 (three) times daily before meals., Disp: 60 mL, Rfl: 3   Insulin Syringe-Needle U-100 (TECHLITE INSULIN SYRINGE) 31G X 15/64" 0.3 ML MISC, Use as directed 4 times a day to inject insulin, Disp: 400 each, Rfl: 3   Insulin Syringe-Needle U-100 31G X 15/64" 0.3 ML MISC, Use 4 times daily as directed, Disp: 400 each, Rfl: 4   Insulin Syringe-Needle U-100 31G X 5/16" 0.3 ML MISC, USE TO ADMINISTER INSULIN 4 TIMES DAILY AS DIRECTED, Disp: 400 each, Rfl: 3   Lancets (FREESTYLE)  lancets, Test blood sugars up to 3 times daily, Disp: 300 each, Rfl: 3   lisinopril (ZESTRIL) 10 MG tablet, Take 1 tablet (10 mg total) by mouth daily., Disp: 90 tablet, Rfl: 0   metFORMIN (GLUCOPHAGE) 1000 MG tablet, Take 1 tablet (1,000 mg total) by mouth 2 (two) times daily with a meal., Disp: 180 tablet, Rfl: 0   NOVOFINE 32G X 6 MM MISC, , Disp: , Rfl:    ondansetron (ZOFRAN-ODT) 4 MG disintegrating tablet, Dissolve 1 tablet (4 mg total) by mouth every 8 (eight) hours as needed for nausea or vomiting., Disp: 20 tablet, Rfl: 0   pantoprazole (PROTONIX) 40 MG tablet, Take 1 tablet (40 mg) by mouth  2 times daily before a meal., Disp: 180 tablet, Rfl: 0   Prenatal Vit-Fe Fumarate-FA (PRENATAL VITAMIN PO), Take 1 tablet by mouth daily., Disp: , Rfl:    tirzepatide (MOUNJARO) 15 MG/0.5ML Pen, Inject 15 mg into the skin once a week., Disp: 6 mL, Rfl: 4   TRUEPLUS INSULIN SYRINGE 30G X 5/16" 1 ML MISC, USE TO INJECT INSULIN 4 TIMES DAILY, Disp: 300 each, Rfl: 3 Social History   Socioeconomic History   Marital status: Married    Spouse name: Therapist, nutritional   Number of children: Not on file   Years of education: 18   Highest education level: Master's degree (e.g., MA, MS, MEng, MEd, MSW, MBA)  Occupational History   Occupation: third grade teacher  Tobacco Use   Smoking status: Never   Smokeless tobacco: Never  Vaping Use   Vaping status: Never Used  Substance and Sexual Activity   Alcohol use: Yes    Comment: occasionally   Drug use: No   Sexual activity: Yes  Other Topics Concern   Not on file  Social History Narrative   Engaged to be married December 8 to [place]   Social Drivers of [employer] Strain: Low Risk  (05/23/2023)   Overall Financial Resource Strain (CARDIA)    Difficulty of Paying Living Expenses: Not very hard  Food Insecurity: No Food Insecurity (05/23/2023)   Hunger Vital Sign    Worried About Running Out of Food in the Last Year: Never true    Ran Out of Food in the Last Year: Never true  Transportation Needs: No Transportation Needs (05/23/2023)   PRAPARE - Administrator, Civil Service (Medical): No    Lack of Transportation (Non-Medical): No  Physical Activity: Insufficiently Active (05/23/2023)   Exercise Vital Sign    Days of Exercise per Week: 3 days    Minutes of Exercise per Session: 30 min  Stress: No Stress Concern Present (05/23/2023)   [employer] of Occupational Health - Occupational Stress Questionnaire    Feeling of Stress : Not at all  Social Connections: Socially Integrated (05/23/2023)   Social Connection and  Isolation Panel [NHANES]    Frequency of Communication with Friends and Family: More than three times a week    Frequency of Social Gatherings with Friends and Family: More than three times a week    Attends Religious Services: More than 4 times per year    Active Member of [employer] or Organizations: Yes    Attends Banker Meetings: More than 4 times per year    Marital Status: Married  Intimate Partner Violence: Unknown (07/09/2021)   Received from [employer], [facility]   HITS    Physically Hurt: Not on file    Insult or Talk Down  To: Not on file    Threaten Physical Harm: Not on file    Scream or Curse: Not on file   Family History  Problem Relation Age of Onset   Birth defects Mother        one hand small   Arthritis Father    Diabetes Father    Heart disease Father 1       heart attack   Psoriasis Father    Psoriasis Brother    Stroke Paternal Grandmother        age 9   Cancer Paternal Grandfather     Objective: Office vital signs reviewed. BP 130/85   Pulse 99   Temp 98.2 F (36.8 C)   Ht 5\' 5"  (1.651 m)   Wt 263 lb 12.8 oz (119.7 kg)   SpO2 100%   BMI 43.90 kg/m   Physical Examination:  General: Awake, alert, well nourished, No acute distress HEENT:sclera white, MMM Cardio: regular rate and rhythm, S1S2 heard, no murmurs appreciated Pulm: clear to auscultation bilaterally, no wheezes, rhonchi or rales; normal work of breathing on room air    Assessment/ Plan: 43 y.o. female   Type 2 diabetes mellitus with both eyes affected by proliferative retinopathy and macular edema, with long-term current use of insulin (HCC) - Plan: Microalbumin / creatinine urine ratio, Bayer DCA Hb A1c Waived, Lipid panel, Continuous Glucose Sensor (DEXCOM G7 SENSOR) MISC, empagliflozin (JARDIANCE) 10 MG TABS tablet, insulin regular (HUMULIN R) 100 units/mL injection, tirzepatide (MOUNJARO) 15 MG/0.5ML Pen  Hypertension associated with diabetes (HCC) - Plan: Basic  metabolic panel, lisinopril (ZESTRIL) 10 MG tablet  Pelvic pressure in female - Plan: Urinalysis, Routine w reflex microscopic  Need for hepatitis C screening test - Plan: Hepatitis C Antibody  Screening for HIV (human immunodeficiency virus) - Plan: HIV Antibody (routine testing w rflx)  Fatigue, unspecified type - Plan: Vitamin B12, TSH, T4, free  Sugar has risen since previous checkup.  She did admit that she is not using her CGM as much as she had been so I have recommended that she go back to this.  I think that she would benefit from keeping a closer eye or on blood sugar.  May consider advancing the Jardiance to 25 mg.  Blood pressure is controlled.  No changes needed.  Refill sent  Urinalysis without evidence of UTI  Did not discuss fatigue but all labs were collected today  Raliegh Ip, DO [facility] [phone]

## 2023-05-25 ENCOUNTER — Other Ambulatory Visit: Payer: Self-pay

## 2023-05-25 ENCOUNTER — Encounter: Payer: Self-pay | Admitting: Pharmacist

## 2023-05-25 ENCOUNTER — Other Ambulatory Visit (HOSPITAL_COMMUNITY): Payer: Self-pay

## 2023-05-25 LAB — LIPID PANEL
Chol/HDL Ratio: 4.6 {ratio} — ABNORMAL HIGH (ref 0.0–4.4)
Cholesterol, Total: 160 mg/dL (ref 100–199)
HDL: 35 mg/dL — ABNORMAL LOW (ref >39)
LDL Chol Calc (NIH): 60 mg/dL (ref 0–99)
Triglycerides: 426 mg/dL — ABNORMAL HIGH (ref 0–149)
VLDL Cholesterol Cal: 65 mg/dL — ABNORMAL HIGH (ref 5–40)

## 2023-05-25 LAB — BASIC METABOLIC PANEL
BUN/Creatinine Ratio: 27 — ABNORMAL HIGH (ref 9–23)
BUN: 21 mg/dL (ref 6–24)
CO2: 22 mmol/L (ref 20–29)
Calcium: 9.8 mg/dL (ref 8.7–10.2)
Chloride: 97 mmol/L (ref 96–106)
Creatinine, Ser: 0.78 mg/dL (ref 0.57–1.00)
Glucose: 148 mg/dL — ABNORMAL HIGH (ref 70–99)
Potassium: 4.2 mmol/L (ref 3.5–5.2)
Sodium: 134 mmol/L (ref 134–144)
eGFR: 97 mL/min/{1.73_m2} (ref >59)

## 2023-05-25 LAB — HIV ANTIBODY (ROUTINE TESTING W REFLEX): HIV Screen 4th Generation wRfx: NONREACTIVE

## 2023-05-25 LAB — VITAMIN B12: Vitamin B-12: 424 pg/mL (ref 232–1245)

## 2023-05-25 LAB — HEPATITIS C ANTIBODY: Hep C Virus Ab: NONREACTIVE

## 2023-05-25 LAB — TSH: TSH: 3.33 u[IU]/mL (ref 0.450–4.500)

## 2023-05-25 LAB — T4, FREE: Free T4: 1.45 ng/dL (ref 0.82–1.77)

## 2023-05-26 ENCOUNTER — Other Ambulatory Visit: Payer: Self-pay

## 2023-05-26 LAB — MICROALBUMIN / CREATININE URINE RATIO
Creatinine, Urine: 7.1 mg/dL
Microalbumin, Urine: <3 ug/mL

## 2023-06-07 ENCOUNTER — Ambulatory Visit: Payer: Commercial Managed Care - PPO | Admitting: Family Medicine

## 2023-06-13 ENCOUNTER — Other Ambulatory Visit: Payer: Self-pay

## 2023-06-14 ENCOUNTER — Other Ambulatory Visit (HOSPITAL_COMMUNITY): Payer: Self-pay

## 2023-06-14 ENCOUNTER — Other Ambulatory Visit: Payer: Self-pay

## 2023-06-19 ENCOUNTER — Other Ambulatory Visit: Payer: Self-pay | Admitting: Family Medicine

## 2023-06-19 DIAGNOSIS — R110 Nausea: Secondary | ICD-10-CM

## 2023-06-20 ENCOUNTER — Other Ambulatory Visit: Payer: Self-pay

## 2023-06-20 NOTE — Telephone Encounter (Signed)
 Last OV 05/24/2023. Last RF 11/24/2022. Next OV 08/26/2023

## 2023-06-21 ENCOUNTER — Other Ambulatory Visit: Payer: Self-pay

## 2023-06-21 ENCOUNTER — Other Ambulatory Visit (HOSPITAL_COMMUNITY): Payer: Self-pay

## 2023-06-21 MED ORDER — ONDANSETRON 4 MG PO TBDP
4.0000 mg | ORAL_TABLET | Freq: Three times a day (TID) | ORAL | 0 refills | Status: DC | PRN
  Filled 2023-06-21: qty 20, 7d supply, fill #0

## 2023-07-04 ENCOUNTER — Other Ambulatory Visit: Payer: Self-pay | Admitting: Family Medicine

## 2023-07-04 DIAGNOSIS — K219 Gastro-esophageal reflux disease without esophagitis: Secondary | ICD-10-CM

## 2023-07-04 DIAGNOSIS — I10 Essential (primary) hypertension: Secondary | ICD-10-CM

## 2023-07-04 DIAGNOSIS — R600 Localized edema: Secondary | ICD-10-CM

## 2023-07-05 ENCOUNTER — Other Ambulatory Visit (HOSPITAL_COMMUNITY): Payer: Self-pay

## 2023-07-05 ENCOUNTER — Encounter: Payer: Self-pay | Admitting: Pharmacist

## 2023-07-05 ENCOUNTER — Other Ambulatory Visit: Payer: Self-pay

## 2023-07-05 MED ORDER — HYDROCHLOROTHIAZIDE 12.5 MG PO TABS
12.5000 mg | ORAL_TABLET | Freq: Every day | ORAL | 0 refills | Status: DC
  Filled 2023-07-05 – 2023-09-07 (×2): qty 90, 90d supply, fill #0

## 2023-07-05 MED ORDER — FLUTICASONE PROPIONATE 50 MCG/ACT NA SUSP
1.0000 | Freq: Every day | NASAL | 1 refills | Status: AC
  Filled 2023-07-05: qty 48, 180d supply, fill #0
  Filled 2023-09-07: qty 16, 60d supply, fill #0
  Filled 2023-10-01 – 2023-11-20 (×2): qty 16, 60d supply, fill #1
  Filled 2023-12-11 – 2024-01-15 (×2): qty 16, 60d supply, fill #2
  Filled 2024-03-05: qty 16, 60d supply, fill #3

## 2023-07-05 MED ORDER — PANTOPRAZOLE SODIUM 40 MG PO TBEC
40.0000 mg | DELAYED_RELEASE_TABLET | Freq: Two times a day (BID) | ORAL | 0 refills | Status: DC
  Filled 2023-07-05 – 2023-09-07 (×2): qty 180, 90d supply, fill #0

## 2023-07-05 MED ORDER — CETIRIZINE HCL 10 MG PO TABS
10.0000 mg | ORAL_TABLET | Freq: Every day | ORAL | 3 refills | Status: AC
  Filled 2023-07-05: qty 90, 90d supply, fill #0
  Filled 2023-08-21 – 2023-10-01 (×3): qty 90, 90d supply, fill #1
  Filled 2023-11-20 – 2023-12-11 (×2): qty 90, 90d supply, fill #2
  Filled 2024-01-15 – 2024-03-05 (×2): qty 90, 90d supply, fill #3

## 2023-07-06 ENCOUNTER — Other Ambulatory Visit: Payer: Self-pay

## 2023-07-06 ENCOUNTER — Other Ambulatory Visit (HOSPITAL_COMMUNITY): Payer: Self-pay

## 2023-07-25 DIAGNOSIS — H43823 Vitreomacular adhesion, bilateral: Secondary | ICD-10-CM | POA: Diagnosis not present

## 2023-07-25 DIAGNOSIS — E113513 Type 2 diabetes mellitus with proliferative diabetic retinopathy with macular edema, bilateral: Secondary | ICD-10-CM | POA: Diagnosis not present

## 2023-07-25 DIAGNOSIS — H2513 Age-related nuclear cataract, bilateral: Secondary | ICD-10-CM | POA: Diagnosis not present

## 2023-07-25 DIAGNOSIS — H35373 Puckering of macula, bilateral: Secondary | ICD-10-CM | POA: Diagnosis not present

## 2023-07-27 ENCOUNTER — Other Ambulatory Visit (HOSPITAL_COMMUNITY): Payer: Self-pay

## 2023-07-29 ENCOUNTER — Other Ambulatory Visit (HOSPITAL_COMMUNITY): Payer: Self-pay

## 2023-07-30 ENCOUNTER — Other Ambulatory Visit: Payer: Self-pay

## 2023-08-09 ENCOUNTER — Other Ambulatory Visit: Payer: Self-pay

## 2023-08-09 ENCOUNTER — Other Ambulatory Visit (HOSPITAL_COMMUNITY): Payer: Self-pay

## 2023-08-22 ENCOUNTER — Other Ambulatory Visit (HOSPITAL_COMMUNITY): Payer: Self-pay

## 2023-08-26 ENCOUNTER — Ambulatory Visit: Admitting: Family Medicine

## 2023-08-26 ENCOUNTER — Ambulatory Visit: Payer: Commercial Managed Care - PPO | Admitting: Family Medicine

## 2023-09-07 ENCOUNTER — Other Ambulatory Visit (HOSPITAL_COMMUNITY): Payer: Self-pay

## 2023-09-07 ENCOUNTER — Other Ambulatory Visit: Payer: Self-pay | Admitting: Family Medicine

## 2023-09-07 ENCOUNTER — Other Ambulatory Visit: Payer: Self-pay

## 2023-09-07 DIAGNOSIS — R110 Nausea: Secondary | ICD-10-CM

## 2023-09-07 MED ORDER — ONDANSETRON 4 MG PO TBDP
4.0000 mg | ORAL_TABLET | Freq: Three times a day (TID) | ORAL | 1 refills | Status: DC | PRN
  Filled 2023-09-07: qty 20, 7d supply, fill #0
  Filled 2023-10-01: qty 20, 7d supply, fill #1

## 2023-09-14 ENCOUNTER — Other Ambulatory Visit: Payer: Self-pay

## 2023-09-14 ENCOUNTER — Other Ambulatory Visit (HOSPITAL_COMMUNITY): Payer: Self-pay

## 2023-09-14 ENCOUNTER — Ambulatory Visit: Admitting: Family Medicine

## 2023-09-14 ENCOUNTER — Encounter: Payer: Self-pay | Admitting: Family Medicine

## 2023-09-14 VITALS — BP 119/80 | HR 100 | Temp 97.9°F | Ht 65.0 in | Wt 255.0 lb

## 2023-09-14 DIAGNOSIS — J019 Acute sinusitis, unspecified: Secondary | ICD-10-CM | POA: Diagnosis not present

## 2023-09-14 DIAGNOSIS — I152 Hypertension secondary to endocrine disorders: Secondary | ICD-10-CM | POA: Diagnosis not present

## 2023-09-14 DIAGNOSIS — E785 Hyperlipidemia, unspecified: Secondary | ICD-10-CM | POA: Diagnosis not present

## 2023-09-14 DIAGNOSIS — E113513 Type 2 diabetes mellitus with proliferative diabetic retinopathy with macular edema, bilateral: Secondary | ICD-10-CM | POA: Diagnosis not present

## 2023-09-14 DIAGNOSIS — Z794 Long term (current) use of insulin: Secondary | ICD-10-CM | POA: Diagnosis not present

## 2023-09-14 DIAGNOSIS — E1169 Type 2 diabetes mellitus with other specified complication: Secondary | ICD-10-CM | POA: Insufficient documentation

## 2023-09-14 DIAGNOSIS — B9689 Other specified bacterial agents as the cause of diseases classified elsewhere: Secondary | ICD-10-CM

## 2023-09-14 DIAGNOSIS — E1159 Type 2 diabetes mellitus with other circulatory complications: Secondary | ICD-10-CM

## 2023-09-14 LAB — BAYER DCA HB A1C WAIVED: HB A1C (BAYER DCA - WAIVED): 7.5 % — ABNORMAL HIGH (ref 4.8–5.6)

## 2023-09-14 MED ORDER — FREESTYLE LIBRE 3 PLUS SENSOR MISC: Status: DC

## 2023-09-14 MED ORDER — AZITHROMYCIN 250 MG PO TABS: ORAL_TABLET | ORAL | 0 refills | Status: DC

## 2023-09-14 MED ORDER — FLUCONAZOLE 150 MG PO TABS
150.0000 mg | ORAL_TABLET | Freq: Once | ORAL | 0 refills | Status: DC
  Filled 2023-09-14: qty 2, 2d supply, fill #0

## 2023-09-14 MED ORDER — AZITHROMYCIN 250 MG PO TABS
ORAL_TABLET | ORAL | 0 refills | Status: DC
  Filled 2023-09-14: qty 6, 5d supply, fill #0

## 2023-09-14 MED ORDER — EMPAGLIFLOZIN 25 MG PO TABS
25.0000 mg | ORAL_TABLET | Freq: Every day | ORAL | 3 refills | Status: DC
  Filled 2023-09-14: qty 90, 90d supply, fill #0
  Filled 2024-01-15: qty 90, 90d supply, fill #1
  Filled 2024-03-05 – 2024-04-14 (×2): qty 90, 90d supply, fill #2
  Filled 2024-04-17 (×2): qty 90, 90d supply, fill #0

## 2023-09-14 MED ORDER — FLUCONAZOLE 150 MG PO TABS: 150.0000 mg | ORAL_TABLET | Freq: Once | ORAL | 0 refills | Status: AC

## 2023-09-14 NOTE — Progress Notes (Signed)
 Subjective: CC:DM PCP: Eliodoro Guerin, DO ZOX:[id number] [name] is a 43 y.o. female presenting to clinic today for:  1. Type 2 Diabetes with hypertension, hyperlipidemia w/ retinopathy:  Currently using Ascom 7 but she finds it to be too bulky and therefore has not heard from utilization of it.  She does do fingerstick glucoses and typically blood sugars are running around 140s to 150s in the morning and then goes down towards the evening.  She is maybe using her Novolin R once per day but primarily relying on her metformin , Jardiance  and Mounjaro  for control of blood sugars.  She reports no vaginal symptoms.  Diabetes Health Maintenance Due  Topic Date Due   HEMOGLOBIN A1C  11/21/2023   OPHTHALMOLOGY EXAM  01/24/2024   FOOT EXAM  01/26/2024    Last A1c:  Lab Results  Component Value Date   HGBA1C 7.7 (H) 05/24/2023    ROS: Denies dizziness, LOC, polyuria, polydipsia, unintended weight loss/gain, foot ulcerations, numbness or tingling in extremities, shortness of breath or chest pain.  2.  URI She has had about a 2-week history of URI with alternating nasal congestion and rhinorrhea.  She reports no chest pain, shortness of breath.  She has not had any severe sinus pain but is going out of town for 11 days this Saturday and is worried about not having access to medical care if it becomes a bacterial infection   ROS: Per HPI  Allergies  Allergen Reactions   Amoxicillin-Pot Clavulanate Anaphylaxis   Covid-19 (Mrna) Vaccine Anaphylaxis   Covid-19 Mrna Vacc (Moderna) Anaphylaxis   Macrobid  [Nitrofurantoin  Macrocrystal] Swelling and Rash   Basaglar  Kwikpen [Insulin  Glargine] Swelling   Penicillins    Avelox [Moxifloxacin Hcl In Nacl] Rash   Past Medical History:  Diagnosis Date   Environmental allergies    GAD (generalized anxiety disorder) 12/05/2018   GERD (gastroesophageal reflux disease)    Hyperlipidemia associated with type 2 diabetes mellitus (HCC) 09/14/2023    Hypertension    Type 2 diabetes mellitus (HCC)     Current Outpatient Medications:    Ascorbic Acid (VITAMIN C) 1000 MG tablet, Take 1,000 mg by mouth daily., Disp: , Rfl:    busPIRone  (BUSPAR ) 10 MG tablet, Take 1 tablet (10 mg total) by mouth 3 (three) times daily., Disp: 270 tablet, Rfl: 3   cetirizine  (ZYRTEC ) 10 MG tablet, Take 1 tablet (10 mg total) by mouth daily., Disp: 90 tablet, Rfl: 3   Continuous Glucose Sensor (DEXCOM G7 SENSOR) MISC, Use to check blood sugar as directed. Change every 10 days, Disp: 9 each, Rfl: 4   empagliflozin  (JARDIANCE ) 10 MG TABS tablet, Take 1 tablet (10 mg total) by mouth daily., Disp: 90 tablet, Rfl: 4   fluticasone  (FLONASE ) 50 MCG/ACT nasal spray, Place 1 spray into both nostrils daily., Disp: 48 g, Rfl: 1   glucose blood (FREESTYLE LITE) test strip, Use 1 strip to test blood sugar 3 (three) times daily., Disp: 300 strip, Rfl: 3   glucose blood test strip, Use three times daily to check blood sugar as directed, Disp: 300 each, Rfl: 3   glucose blood test strip, Use one strip to test blood sugar three times daily, Disp: 300 strip, Rfl: 3   hydrochlorothiazide  (HYDRODIURIL ) 12.5 MG tablet, Take 1 tablet (12.5 mg total) by mouth daily., Disp: 90 tablet, Rfl: 0   ibuprofen  (ADVIL ) 800 MG tablet, Take 1 tablet (800 mg total) by mouth every 8 (eight) hours as needed., Disp: 30 tablet, Rfl:  0   insulin  regular (HUMULIN  R) 100 units/mL injection, Inject 0.2 mLs (20 Units total) into the skin 3 (three) times daily before meals., Disp: 60 mL, Rfl: 3   Insulin  Syringe-Needle U-100 (TECHLITE INSULIN  SYRINGE) 31G X 15/64 0.3 ML MISC, Use as directed 4 times a day to inject insulin , Disp: 400 each, Rfl: 3   Insulin  Syringe-Needle U-100 31G X 15/64 0.3 ML MISC, Use 4 times daily as directed, Disp: 400 each, Rfl: 4   Insulin  Syringe-Needle U-100 31G X 5/16 0.3 ML MISC, USE TO ADMINISTER INSULIN  4 TIMES DAILY AS DIRECTED, Disp: 400 each, Rfl: 3   Lancets (FREESTYLE)  lancets, Test blood sugars up to 3 times daily, Disp: 300 each, Rfl: 3   lisinopril  (ZESTRIL ) 10 MG tablet, Take 1 tablet (10 mg total) by mouth daily., Disp: 90 tablet, Rfl: 4   metFORMIN  (GLUCOPHAGE ) 1000 MG tablet, Take 1 tablet (1,000 mg total) by mouth 2 (two) times daily with a meal., Disp: 180 tablet, Rfl: 4   NOVOFINE 32G X 6 MM MISC, , Disp: , Rfl:    ondansetron  (ZOFRAN -ODT) 4 MG disintegrating tablet, Dissolve 1 tablet (4 mg total) by mouth every 8 (eight) hours as needed for nausea or vomiting., Disp: 20 tablet, Rfl: 1   pantoprazole  (PROTONIX ) 40 MG tablet, Take 1 tablet (40 mg) by mouth 2 times daily before a meal., Disp: 180 tablet, Rfl: 0   Prenatal Vit-Fe Fumarate-FA (PRENATAL VITAMIN PO), Take 1 tablet by mouth daily., Disp: , Rfl:    tirzepatide  (MOUNJARO ) 15 MG/0.5ML Pen, Inject 15 mg into the skin once a week., Disp: 6 mL, Rfl: 4   TRUEPLUS INSULIN  SYRINGE 30G X 5/16 1 ML MISC, USE TO INJECT INSULIN  4 TIMES DAILY, Disp: 300 each, Rfl: 3 Social History   Socioeconomic History   Marital status: Married    Spouse name: Therapist, nutritional   Number of children: Not on file   Years of education: 18   Highest education level: Master's degree (e.g., MA, MS, MEng, MEd, MSW, MBA)  Occupational History   Occupation: third grade teacher  Tobacco Use   Smoking status: Never   Smokeless tobacco: Never  Vaping Use   Vaping status: Never Used  Substance and Sexual Activity   Alcohol use: Yes    Comment: occasionally   Drug use: No   Sexual activity: Yes  Other Topics Concern   Not on file  Social History Narrative   Engaged to be married December 8 to [place]   Social Drivers of [employer] Strain: Low Risk  (05/23/2023)   Overall Financial Resource Strain (CARDIA)    Difficulty of Paying Living Expenses: Not very hard  Food Insecurity: No Food Insecurity (05/23/2023)   Hunger Vital Sign    Worried About Running Out of Food in the Last Year: Never true    Ran Out of  Food in the Last Year: Never true  Transportation Needs: No Transportation Needs (05/23/2023)   PRAPARE - Administrator, Civil Service (Medical): No    Lack of Transportation (Non-Medical): No  Physical Activity: Insufficiently Active (05/23/2023)   Exercise Vital Sign    Days of Exercise per Week: 3 days    Minutes of Exercise per Session: 30 min  Stress: No Stress Concern Present (05/23/2023)   [employer] of Occupational Health - Occupational Stress Questionnaire    Feeling of Stress : Not at all  Social Connections: Socially Integrated (05/23/2023)   Social Connection and  Isolation Panel [NHANES]    Frequency of Communication with Friends and Family: More than three times a week    Frequency of Social Gatherings with Friends and Family: More than three times a week    Attends Religious Services: More than 4 times per year    Active Member of [employer] or Organizations: Yes    Attends Banker Meetings: More than 4 times per year    Marital Status: Married  Catering manager Violence: Unknown (07/09/2021)   Received from [employer], [facility]   HITS    Physically Hurt: Not on file    Insult or Talk Down To: Not on file    Threaten Physical Harm: Not on file    Scream or Curse: Not on file   Family History  Problem Relation Age of Onset   Birth defects Mother        one hand small   Arthritis Father    Diabetes Father    Heart disease Father 28       heart attack   Psoriasis Father    Psoriasis Brother    Stroke Paternal Grandmother        age 58   Cancer Paternal Grandfather     Objective: Office vital signs reviewed. BP 119/80   Pulse 100   Temp 97.9 F (36.6 C)   Ht 5' 5 (1.651 m)   Wt 255 lb (115.7 kg)   LMP 08/26/2023   SpO2 96%   BMI 42.43 kg/m   Physical Examination:  General: Awake, alert, well nourished, No acute distress HEENT: Normal    Neck: No masses palpated. No lymphadenopathy    Ears: Tympanic membranes intact,  normal light reflex, no erythema, no bulging    Eyes: PERRLA, extraocular membranes intact, sclera white    Nose: nasal turbinates moist, clear nasal discharge    Throat: moist mucus membranes, no erythema, no tonsillar exudate.  Airway is patent Cardio: regular rate and rhythm, S1S2 heard, no murmurs appreciated Pulm: clear to auscultation bilaterally, no wheezes, rhonchi or rales; normal work of breathing on room air   Assessment/ Plan: 43 y.o. female   Type 2 diabetes mellitus with both eyes affected by proliferative retinopathy and macular edema, with long-term current use of insulin  (HCC) - Plan: Bayer DCA Hb A1c Waived, empagliflozin  (JARDIANCE ) 25 MG TABS tablet, Continuous Glucose Sensor (FREESTYLE LIBRE 3 PLUS SENSOR) MISC  Hypertension associated with diabetes (HCC)  Hyperlipidemia associated with type 2 diabetes mellitus (HCC)  Acute bacterial sinusitis - Plan: azithromycin  (ZITHROMAX ) 250 MG tablet, fluconazole  (DIFLUCAN ) 150 MG tablet, DISCONTINUED: azithromycin  (ZITHROMAX ) 250 MG tablet, DISCONTINUED: fluconazole  (DIFLUCAN ) 150 MG tablet  Sugar not at goal with A1c at 7.5.  This is slightly down from previous check up but still would like to have her below 7.  I am advancing her Jardiance  to 25 mg daily.  I have given her samples of the freestyle libre 3+ sensors and she will let me know if she likes these and I can prescribe them for her.  Blood pressure is controlled.  She will need to strongly consider statin therapy at some point  I have given her a pocket prescription for Z-Pak and Diflucan .  She understands indications for use.  Follow-up as needed this issue or in 3 months for blood sugar recheck  Eliodoro Guerin, DO [facility] [phone]

## 2023-09-29 ENCOUNTER — Other Ambulatory Visit (HOSPITAL_COMMUNITY): Payer: Self-pay

## 2023-10-01 ENCOUNTER — Other Ambulatory Visit: Payer: Self-pay | Admitting: Family Medicine

## 2023-10-01 DIAGNOSIS — K219 Gastro-esophageal reflux disease without esophagitis: Secondary | ICD-10-CM

## 2023-10-01 DIAGNOSIS — I10 Essential (primary) hypertension: Secondary | ICD-10-CM

## 2023-10-01 DIAGNOSIS — R600 Localized edema: Secondary | ICD-10-CM

## 2023-10-03 ENCOUNTER — Other Ambulatory Visit (HOSPITAL_COMMUNITY): Payer: Self-pay

## 2023-10-03 ENCOUNTER — Other Ambulatory Visit: Payer: Self-pay

## 2023-10-03 MED ORDER — HYDROCHLOROTHIAZIDE 12.5 MG PO TABS
12.5000 mg | ORAL_TABLET | Freq: Every day | ORAL | 0 refills | Status: DC
  Filled 2023-10-03 – 2023-11-20 (×3): qty 90, 90d supply, fill #0

## 2023-10-03 MED ORDER — PANTOPRAZOLE SODIUM 40 MG PO TBEC
40.0000 mg | DELAYED_RELEASE_TABLET | Freq: Two times a day (BID) | ORAL | 0 refills | Status: DC
  Filled 2023-10-03 – 2023-11-20 (×2): qty 180, 90d supply, fill #0

## 2023-10-12 ENCOUNTER — Other Ambulatory Visit: Payer: Self-pay

## 2023-10-12 ENCOUNTER — Other Ambulatory Visit (HOSPITAL_COMMUNITY): Payer: Self-pay

## 2023-10-12 DIAGNOSIS — H5213 Myopia, bilateral: Secondary | ICD-10-CM | POA: Diagnosis not present

## 2023-10-12 DIAGNOSIS — E119 Type 2 diabetes mellitus without complications: Secondary | ICD-10-CM | POA: Diagnosis not present

## 2023-10-12 DIAGNOSIS — H53143 Visual discomfort, bilateral: Secondary | ICD-10-CM | POA: Diagnosis not present

## 2023-10-14 ENCOUNTER — Other Ambulatory Visit: Payer: Self-pay

## 2023-10-31 DIAGNOSIS — E113513 Type 2 diabetes mellitus with proliferative diabetic retinopathy with macular edema, bilateral: Secondary | ICD-10-CM | POA: Diagnosis not present

## 2023-11-18 ENCOUNTER — Other Ambulatory Visit (HOSPITAL_COMMUNITY): Payer: Self-pay

## 2023-11-20 ENCOUNTER — Other Ambulatory Visit: Payer: Self-pay | Admitting: Family Medicine

## 2023-11-20 ENCOUNTER — Other Ambulatory Visit (HOSPITAL_COMMUNITY): Payer: Self-pay

## 2023-11-20 DIAGNOSIS — R110 Nausea: Secondary | ICD-10-CM

## 2023-11-21 ENCOUNTER — Other Ambulatory Visit: Payer: Self-pay

## 2023-11-21 ENCOUNTER — Other Ambulatory Visit (HOSPITAL_COMMUNITY): Payer: Self-pay

## 2023-11-21 MED ORDER — FREESTYLE LITE TEST VI STRP
ORAL_STRIP | 0 refills | Status: DC
  Filled 2023-11-21: qty 300, 90d supply, fill #0

## 2023-11-22 ENCOUNTER — Other Ambulatory Visit: Payer: Self-pay

## 2023-11-22 ENCOUNTER — Other Ambulatory Visit (HOSPITAL_COMMUNITY): Payer: Self-pay

## 2023-11-22 MED ORDER — ONDANSETRON 4 MG PO TBDP
4.0000 mg | ORAL_TABLET | Freq: Three times a day (TID) | ORAL | 1 refills | Status: DC | PRN
  Filled 2023-11-22: qty 20, 7d supply, fill #0
  Filled 2023-12-11: qty 20, 7d supply, fill #1

## 2023-11-30 ENCOUNTER — Other Ambulatory Visit (HOSPITAL_COMMUNITY): Payer: Self-pay

## 2023-12-11 ENCOUNTER — Other Ambulatory Visit: Payer: Self-pay | Admitting: Family Medicine

## 2023-12-11 DIAGNOSIS — R600 Localized edema: Secondary | ICD-10-CM

## 2023-12-11 DIAGNOSIS — K219 Gastro-esophageal reflux disease without esophagitis: Secondary | ICD-10-CM

## 2023-12-11 DIAGNOSIS — I10 Essential (primary) hypertension: Secondary | ICD-10-CM

## 2023-12-12 ENCOUNTER — Other Ambulatory Visit: Payer: Self-pay

## 2023-12-12 ENCOUNTER — Other Ambulatory Visit (HOSPITAL_COMMUNITY): Payer: Self-pay

## 2023-12-12 MED ORDER — HYDROCHLOROTHIAZIDE 12.5 MG PO TABS
12.5000 mg | ORAL_TABLET | Freq: Every day | ORAL | 0 refills | Status: DC
  Filled 2023-12-12 – 2024-01-15 (×2): qty 90, 90d supply, fill #0

## 2023-12-12 MED ORDER — BUSPIRONE HCL 10 MG PO TABS
10.0000 mg | ORAL_TABLET | Freq: Three times a day (TID) | ORAL | 0 refills | Status: DC
  Filled 2023-12-12 – 2024-02-14 (×3): qty 270, 90d supply, fill #0

## 2023-12-12 MED ORDER — PANTOPRAZOLE SODIUM 40 MG PO TBEC
40.0000 mg | DELAYED_RELEASE_TABLET | Freq: Two times a day (BID) | ORAL | 0 refills | Status: DC
  Filled 2023-12-12 – 2024-01-15 (×2): qty 180, 90d supply, fill #0

## 2023-12-16 ENCOUNTER — Ambulatory Visit: Admitting: Family Medicine

## 2024-01-14 ENCOUNTER — Telehealth

## 2024-01-14 ENCOUNTER — Encounter

## 2024-01-14 DIAGNOSIS — B370 Candidal stomatitis: Secondary | ICD-10-CM

## 2024-01-15 ENCOUNTER — Other Ambulatory Visit (HOSPITAL_COMMUNITY): Payer: Self-pay

## 2024-01-15 ENCOUNTER — Other Ambulatory Visit: Payer: Self-pay | Admitting: Family Medicine

## 2024-01-15 DIAGNOSIS — R110 Nausea: Secondary | ICD-10-CM

## 2024-01-15 MED ORDER — NYSTATIN 100000 UNIT/ML MT SUSP
5.0000 mL | Freq: Four times a day (QID) | OROMUCOSAL | 0 refills | Status: DC
Start: 2024-01-15 — End: 2024-01-17

## 2024-01-15 NOTE — Progress Notes (Signed)
 E-Visit Treatment for Thrush symptoms  We are sorry that you are not feeling well. Here is how we plan to help!  Based on what you have shared with me, it appears that you have Thrush.  Celestino is a fungal (yeast) infection that can grow in your mouth, throat, and other parts of your body. With oral thrush (oral candidiasis), you may develop white, raised, cottage cheese-like lesions (spots) on your tongue and cheeks. Celestino can quickly become irritated and cause mouth pain and redness. Thrush usually develops suddenly. A common sign is the presence of creamy white, slightly raised lesions in your mouth -- usually on your tongue or inner cheeks. You may also have lesions on the roof of your mouth, gums, tonsils, or back of your throat.   Other symptoms may include: Redness and soreness inside and at the corners of your mouth Loss of sense of taste (ageusia) Cottony feeling in your mouth  The lesions can hurt and may bleed a little when you scrape them or brush your teeth. In severe cases, the lesions can spread into your esophagus and cause: Pain or difficulty swallowing A feeling that food gets stuck in your throat or mid-chest area Fever, if the infection spreads beyond your esophagus  Most people have small amounts of the Candida fungus in their mouth, digestive tract and skin. When illnesses, stress or medications disturb this balance, the fungus grows out of control and causes thrush.  Medications that can make yeast flourish and cause infection include: Corticosteroids Inhalers Antibiotics Birth Control Pills  Celestino can be contagious to those at risk (like people with weakened immune systems or who take certain medications). In people with healthy immune systems, it's unusual to pass thrush through kissing or other close contact. In most cases, thrush isn't particularly contagious (meaning, it doesn't spread from person to person), but it is transmittable (meaning, you can catch it  in other ways, like through saliva when you are immunocompromised).  I have prescribed I have prescribed an antifungal - Nystatin  suspension 100,000 units/mL Swish and swallow 5mL every 6 hours for up to 10-14 days and Antifungals can clear up thrush in one to two weeks. You may need to continue the medication for a few more days to kill any fungus that's left behind.  Prevention: Practice good oral hygiene. See your dentist regularly. This is especially important if you have diabetes or wear dentures. Limit the amount of sugar and yeast-containing foods you eat. Foods such as bread, beer and wine encourage Candida growth. Avoid smoking and other tobacco use. Ask your healthcare provider about ways to help you quit smoking (We do offer a smoking cessation program through the [facility] that you can schedule on your time through MyChart). With treatment, thrush usually goes away within one to two weeks. But if your symptoms linger or get worse, please seek in-person evaluation.  Home Care: Swish with warm saltwater. Take probiotics. Eat yogurt that contains healthy bacteria.  GET HELP RIGHT AWAY IF: Ulcers that are spreading, are very large or particularly painful Ulcers last longer than one week without improving on treatment If you develop a fever, swollen glands and begin to feel unwell  MAKE SURE YOU: Understand these instructions Will watch your condition. Will get help right away if you are not doing well or get worse.  Thank you for choosing an e-visit!  Your e-visit answers were reviewed by a board certified advanced clinical practitioner to complete your personal care  plan. Depending upon the condition, your plan could have included both over the counter or prescription medications.  Please review your pharmacy choice. Make sure the pharmacy is open so you can pick up prescription now. If there is a problem, you may contact your provider through [employer] and have the prescription routed to another pharmacy. Your safety is important to us . If you have drug allergies, check your prescription carefully.  For the next 24 hours you can use MyChart to ask questions about today's visit, request a non-urgent call back, or ask for a work or school excuse.  You will get an email in the next two days asking about your experience. I hope that your e-visit has been valuable and will speed your recovery.  Approximately 5 minutes was spent documenting and reviewing patient's chart.

## 2024-01-16 ENCOUNTER — Encounter: Payer: Self-pay | Admitting: Family Medicine

## 2024-01-16 ENCOUNTER — Other Ambulatory Visit (HOSPITAL_COMMUNITY): Payer: Self-pay

## 2024-01-16 ENCOUNTER — Other Ambulatory Visit: Payer: Self-pay

## 2024-01-16 ENCOUNTER — Ambulatory Visit: Admitting: Family Medicine

## 2024-01-16 MED ORDER — ONDANSETRON 4 MG PO TBDP
4.0000 mg | ORAL_TABLET | Freq: Three times a day (TID) | ORAL | 0 refills | Status: AC | PRN
  Filled 2024-01-16: qty 20, 7d supply, fill #0

## 2024-01-17 ENCOUNTER — Encounter: Payer: Self-pay | Admitting: Family Medicine

## 2024-01-17 ENCOUNTER — Ambulatory Visit: Admitting: Family Medicine

## 2024-01-17 ENCOUNTER — Other Ambulatory Visit: Payer: Self-pay

## 2024-01-17 VITALS — BP 127/85 | HR 112 | Temp 98.0°F | Ht 65.0 in | Wt 245.8 lb

## 2024-01-17 DIAGNOSIS — R197 Diarrhea, unspecified: Secondary | ICD-10-CM | POA: Diagnosis not present

## 2024-01-17 DIAGNOSIS — B370 Candidal stomatitis: Secondary | ICD-10-CM | POA: Diagnosis not present

## 2024-01-17 MED ORDER — NYSTATIN 100000 UNIT/ML MT SUSP: 5.0000 mL | Freq: Four times a day (QID) | OROMUCOSAL | 0 refills | Status: DC

## 2024-01-17 NOTE — Progress Notes (Signed)
 Subjective: RR:uymldy PCP: Amy Amy HERO, DO YEP:Zopsjazuy [name] is a 43 y.o. female presenting to clinic today for:  Patient had an e-visit done yesterday and treated with nystatin .  She started using it does seem to be making things better.  She had some irritation of the left side of the tongue which felt like she had extremely dry mouth and had some difficulty rotating her tongue around her mouth.  She noticed a white plaque along the lateral aspect of the mouth.  No recent antibiotics but she admits that she is been under a lot of stress with school and was sick with maybe some type of viral illness over the weekend prior to symptom onset.  She had some diarrhea that has since resolved.  She has been swallowing the nystatin .   ROS: Per HPI  Allergies  Allergen Reactions   Amoxicillin-Pot Clavulanate Anaphylaxis   Covid-19 (Mrna) Vaccine Anaphylaxis   Covid-19 Mrna Vacc (Moderna) Anaphylaxis   Macrobid  [Nitrofurantoin  Macrocrystal] Swelling and Rash   Basaglar  Kwikpen [Insulin  Glargine] Swelling   Penicillins    Avelox [Moxifloxacin Hcl In Nacl] Rash   Past Medical History:  Diagnosis Date   Environmental allergies    GAD (generalized anxiety disorder) 12/05/2018   GERD (gastroesophageal reflux disease)    Hyperlipidemia associated with type 2 diabetes mellitus (HCC) 09/14/2023   Hypertension    Type 2 diabetes mellitus (HCC)     Current Outpatient Medications:    Ascorbic Acid (VITAMIN C) 1000 MG tablet, Take 1,000 mg by mouth daily., Disp: , Rfl:    busPIRone  (BUSPAR ) 10 MG tablet, Take 1 tablet (10 mg total) by mouth 3 (three) times daily., Disp: 270 tablet, Rfl: 0   cetirizine  (ZYRTEC ) 10 MG tablet, Take 1 tablet (10 mg total) by mouth daily., Disp: 90 tablet, Rfl: 3   Continuous Glucose Sensor (DEXCOM G7 SENSOR) MISC, Use to check blood sugar as directed. Change every 10 days, Disp: 9 each, Rfl: 4   Continuous Glucose Sensor (FREESTYLE LIBRE 3 PLUS SENSOR)  MISC, Check BGs continuously. Change sensor every 15 days. [id number], Disp: , Rfl:    empagliflozin  (JARDIANCE ) 25 MG TABS tablet, Take 1 tablet (25 mg total) by mouth daily., Disp: 90 tablet, Rfl: 3   fluticasone  (FLONASE ) 50 MCG/ACT nasal spray, Place 1 spray into both nostrils daily., Disp: 48 g, Rfl: 1   glucose blood (FREESTYLE LITE) test strip, Use 1 strip to test blood sugar 3 (three) times daily., Disp: 300 strip, Rfl: 3   glucose blood (FREESTYLE LITE) test strip, Use to check blood sugar 3 times daily., Disp: 300 each, Rfl: 0   glucose blood test strip, Use three times daily to check blood sugar as directed, Disp: 300 each, Rfl: 3   glucose blood test strip, Use one strip to test blood sugar three times daily, Disp: 300 strip, Rfl: 3   hydrochlorothiazide  (HYDRODIURIL ) 12.5 MG tablet, Take 1 tablet (12.5 mg total) by mouth daily., Disp: 90 tablet, Rfl: 0   ibuprofen  (ADVIL ) 800 MG tablet, Take 1 tablet (800 mg total) by mouth every 8 (eight) hours as needed., Disp: 30 tablet, Rfl: 0   insulin  regular (HUMULIN  R) 100 units/mL injection, Inject 0.2 mLs (20 Units total) into the skin 3 (three) times daily before meals., Disp: 60 mL, Rfl: 3   Insulin  Syringe-Needle U-100 (TECHLITE INSULIN  SYRINGE) 31G X 15/64 0.3 ML MISC, Use as directed 4 times a day to inject insulin , Disp: 400 each, Rfl: 3  Insulin  Syringe-Needle U-100 31G X 15/64 0.3 ML MISC, Use 4 times daily as directed, Disp: 400 each, Rfl: 4   Insulin  Syringe-Needle U-100 31G X 5/16 0.3 ML MISC, USE TO ADMINISTER INSULIN  4 TIMES DAILY AS DIRECTED, Disp: 400 each, Rfl: 3   Lancets (FREESTYLE) lancets, Test blood sugars up to 3 times daily, Disp: 300 each, Rfl: 3   lisinopril  (ZESTRIL ) 10 MG tablet, Take 1 tablet (10 mg total) by mouth daily., Disp: 90 tablet, Rfl: 4   metFORMIN  (GLUCOPHAGE ) 1000 MG tablet, Take 1 tablet (1,000 mg total) by mouth 2 (two) times daily with a meal., Disp: 180 tablet, Rfl: 4   NOVOFINE 32G X 6 MM MISC, ,  Disp: , Rfl:    ondansetron  (ZOFRAN -ODT) 4 MG disintegrating tablet, Dissolve 1 tablet (4 mg total) by mouth every 8 (eight) hours as needed for nausea or vomiting., Disp: 20 tablet, Rfl: 0   pantoprazole  (PROTONIX ) 40 MG tablet, Take 1 tablet (40 mg) by mouth 2 times daily before a meal., Disp: 180 tablet, Rfl: 0   Prenatal Vit-Fe Fumarate-FA (PRENATAL VITAMIN PO), Take 1 tablet by mouth daily., Disp: , Rfl:    tirzepatide  (MOUNJARO ) 15 MG/0.5ML Pen, Inject 15 mg into the skin once a week., Disp: 6 mL, Rfl: 4   TRUEPLUS INSULIN  SYRINGE 30G X 5/16 1 ML MISC, USE TO INJECT INSULIN  4 TIMES DAILY, Disp: 300 each, Rfl: 3   nystatin  (MYCOSTATIN ) 100000 UNIT/ML suspension, Take 5 mLs (500,000 Units total) by mouth 4 (four) times daily. Put on file, Disp: 473 mL, Rfl: 0 Social History   Socioeconomic History   Marital status: Married    Spouse name: Amy Ball   Number of children: Not on file   Years of education: 18   Highest education level: Master's degree (e.g., MA, MS, MEng, MEd, MSW, MBA)  Occupational History   Occupation: third grade teacher  Tobacco Use   Smoking status: Never   Smokeless tobacco: Never  Vaping Use   Vaping status: Never Used  Substance and Sexual Activity   Alcohol use: Yes    Comment: occasionally   Drug use: No   Sexual activity: Yes  Other Topics Concern   Not on file  Social History Narrative   Engaged to be married December 8 to Amy Ball   Social Drivers of [employer] Strain: Low Risk  (05/23/2023)   Overall Financial Resource Strain (CARDIA)    Difficulty of Paying Living Expenses: Not very hard  Food Insecurity: No Food Insecurity (05/23/2023)   Hunger Vital Sign    Worried About Running Out of Food in the Last Year: Never true    Ran Out of Food in the Last Year: Never true  Transportation Needs: No Transportation Needs (05/23/2023)   PRAPARE - Administrator, Civil Service (Medical): No    Lack of Transportation  (Non-Medical): No  Physical Activity: Insufficiently Active (05/23/2023)   Exercise Vital Sign    Days of Exercise per Week: 3 days    Minutes of Exercise per Session: 30 min  Stress: No Stress Concern Present (05/23/2023)   [employer] of Occupational Health - Occupational Stress Questionnaire    Feeling of Stress : Not at all  Social Connections: Socially Integrated (05/23/2023)   Social Connection and Isolation Panel    Frequency of Communication with Friends and Family: More than three times a week    Frequency of Social Gatherings with Friends and Family: More than three times a  week    Attends Religious Services: More than 4 times per year    Active Member of Clubs or Organizations: Yes    Attends Banker Meetings: More than 4 times per year    Marital Status: Married  Catering manager Violence: Unknown (07/09/2021)   Received from [facility]   HITS    Physically Hurt: Not on file    Insult or Talk Down To: Not on file    Threaten Physical Harm: Not on file    Scream or Curse: Not on file   Family History  Problem Relation Age of Onset   Birth defects Mother        one hand small   Arthritis Father    Diabetes Father    Heart disease Father 62       heart attack   Psoriasis Father    Psoriasis Brother    Stroke Paternal Grandmother        age 104   Cancer Paternal Grandfather     Objective: Office vital signs reviewed. BP 127/85   Pulse (!) 112   Temp 98 F (36.7 C)   Ht 5' 5 (1.651 m)   Wt 245 lb 12.8 oz (111.5 kg)   SpO2 99%   BMI 40.90 kg/m   Physical Examination:  General: Awake, alert, nontoxic-appearing female, No acute distress HEENT: sclera white, MMM.  No white plaques on the tongue or oropharynx appreciated.  No lesions.  She has some symmetric rise of the palate Cardio: regular rate and rhythm, S1S2 heard, no murmurs appreciated Pulm: clear to auscultation bilaterally, no wheezes, rhonchi or rales; normal work of breathing on  room air    Assessment/ Plan: 43 y.o. female   Oral thrush - Plan: nystatin  (MYCOSTATIN ) 100000 UNIT/ML suspension  Diarrhea, unspecified type   Switch to gargle and spit rather than swallow.  This should hopefully help alleviate some of the GI symptoms she has been experiencing with this.  We discussed that if her symptoms recur then to let me know.   Amy CHRISTELLA Fielding, DO [facility] [phone]

## 2024-01-26 ENCOUNTER — Other Ambulatory Visit (HOSPITAL_COMMUNITY): Payer: Self-pay

## 2024-01-30 ENCOUNTER — Encounter: Payer: Self-pay | Admitting: Family Medicine

## 2024-01-30 NOTE — Telephone Encounter (Signed)
 Appointment has been changed to 3:15 per request.

## 2024-01-30 NOTE — Telephone Encounter (Signed)
 Please use that SD slot to give her a CPE at 315 on her current scheduled day please.

## 2024-01-31 ENCOUNTER — Ambulatory Visit: Admitting: Family Medicine

## 2024-02-01 ENCOUNTER — Ambulatory Visit: Admitting: Family Medicine

## 2024-02-02 ENCOUNTER — Encounter: Payer: Self-pay | Admitting: Family Medicine

## 2024-02-10 ENCOUNTER — Telehealth: Admitting: Physician Assistant

## 2024-02-10 ENCOUNTER — Other Ambulatory Visit (HOSPITAL_COMMUNITY): Payer: Self-pay

## 2024-02-10 DIAGNOSIS — B370 Candidal stomatitis: Secondary | ICD-10-CM

## 2024-02-11 NOTE — Progress Notes (Signed)
   Thank you for the details you included in the comment boxes. Those details are very helpful in determining the best course of treatment for you and help us  to provide the best care.Because of your current symptoms, we recommend that you schedule a Virtual Urgent Care video visit in order for the provider to better assess what is going on.  The provider will be able to give you a more accurate diagnosis and treatment plan if we can more freely discuss your symptoms and with the addition of a virtual examination.   If you change your visit to a video visit, we will bill your insurance (similar to an office visit) and you will not be charged for this e-Visit. You will be able to stay at home and speak with the first available [facility] advanced practice provider. The link to do a video visit is in the drop down Menu tab of your Welcome screen in MyChart.    I have spent 5 minutes in review of e-visit questionnaire, review and updating patient chart, medical decision making and response to patient.   Roosvelt Mater, PA-C

## 2024-02-14 ENCOUNTER — Other Ambulatory Visit (HOSPITAL_COMMUNITY): Payer: Self-pay

## 2024-02-15 ENCOUNTER — Other Ambulatory Visit (HOSPITAL_COMMUNITY): Payer: Self-pay

## 2024-02-15 ENCOUNTER — Other Ambulatory Visit: Payer: Self-pay

## 2024-02-15 ENCOUNTER — Other Ambulatory Visit (HOSPITAL_BASED_OUTPATIENT_CLINIC_OR_DEPARTMENT_OTHER): Payer: Self-pay

## 2024-02-17 ENCOUNTER — Telehealth: Admitting: Family

## 2024-02-17 ENCOUNTER — Encounter: Payer: Self-pay | Admitting: Family

## 2024-02-17 DIAGNOSIS — K130 Diseases of lips: Secondary | ICD-10-CM

## 2024-02-17 DIAGNOSIS — Z794 Long term (current) use of insulin: Secondary | ICD-10-CM | POA: Diagnosis not present

## 2024-02-17 DIAGNOSIS — E113513 Type 2 diabetes mellitus with proliferative diabetic retinopathy with macular edema, bilateral: Secondary | ICD-10-CM | POA: Diagnosis not present

## 2024-02-17 DIAGNOSIS — B370 Candidal stomatitis: Secondary | ICD-10-CM | POA: Diagnosis not present

## 2024-02-17 MED ORDER — NYSTATIN 100000 UNIT/ML MT SUSP: 5.0000 mL | Freq: Four times a day (QID) | OROMUCOSAL | 1 refills | Status: DC

## 2024-02-17 MED ORDER — CLOTRIMAZOLE 10 MG MT TROC: 10.0000 mg | Freq: Every day | OROMUCOSAL | 1 refills | Status: DC

## 2024-02-17 MED ORDER — FLUCONAZOLE 150 MG PO TABS: 150.0000 mg | ORAL_TABLET | ORAL | 0 refills | Status: DC | PRN

## 2024-02-17 MED ORDER — KETOCONAZOLE 2 % EX CREA
1.0000 | TOPICAL_CREAM | Freq: Every day | CUTANEOUS | 0 refills | Status: AC
Start: 2024-02-17 — End: ?

## 2024-02-17 NOTE — Progress Notes (Signed)
 Virtual Visit Consent   Amy Ball, you are scheduled for a virtual visit with a  provider today. Just as with appointments in the office, your consent must be obtained to participate. Your consent will be active for this visit and any virtual visit you may have with one of our providers in the next 365 days. If you have a MyChart account, a copy of this consent can be sent to you electronically.  As this is a virtual visit, video technology does not allow for your provider to perform a traditional examination. This may limit your provider's ability to fully assess your condition. If your provider identifies any concerns that need to be evaluated in person or the need to arrange testing (such as labs, EKG, etc.), we will make arrangements to do so. Although advances in technology are sophisticated, we cannot ensure that it will always work on either your end or our end. If the connection with a video visit is poor, the visit may have to be switched to a telephone visit. With either a video or telephone visit, we are not always able to ensure that we have a secure connection.  By engaging in this virtual visit, you consent to the provision of healthcare and authorize for your insurance to be billed (if applicable) for the services provided during this visit. Depending on your insurance coverage, you may receive a charge related to this service.  I need to obtain your verbal consent now. Are you willing to proceed with your visit today? Amy Ball has provided verbal consent on 02/17/2024 for a virtual visit (video or telephone). Bari Learn, FNP  Date: 02/17/2024 4:02 PM   Virtual Visit via Video Note   I, Bari Learn, [name]  ([id number], 43/19/1982) on 02/17/24 at  3:40 PM EST by a video-enabled telemedicine application and verified that I am speaking with the correct person using two identifiers.  Location: Patient: Virtual Visit Location  Patient: Home Provider: Virtual Visit Location Provider: Home Office   I discussed the limitations of evaluation and management by telemedicine and the availability of in person appointments. The patient expressed understanding and agreed to proceed.    History of Present Illness: Amy Ball is a 43 y.o. who identifies as a female who was assigned female at birth, and is being seen today for follow up for oral thrush. She was diagnosed with oral thrush on 01/14/24 and completed nystatin  oral. Reports every time she stops the nystatin  she gets the thrush on her cheeks and has noticed cracking on the side of bilateral mouth.    She is a diabetic. Her last A1C was 7.5.  HPI: HPI  Problems:  Patient Active Problem List   Diagnosis Date Noted   Hyperlipidemia associated with type 2 diabetes mellitus (HCC) 09/14/2023   Type 2 diabetes mellitus with both eyes affected by proliferative retinopathy and macular edema, with long-term current use of insulin  (HCC) 09/14/2023   Hypercortisolemia 03/14/2020   GAD (generalized anxiety disorder) 12/05/2018   Gastroesophageal reflux disease without esophagitis 02/20/2018   Intertriginous candidiasis 02/20/2018   Attempting to conceive 02/20/2018   Obesity, Class III, BMI 40-49.9 (morbid obesity) (HCC) 02/15/2017   Controlled type 2 diabetes mellitus without complication, without long-term current use of insulin  (HCC) 02/15/2017   Hypertension associated with diabetes (HCC) 02/15/2017   Environmental allergies 02/15/2017   Knee pain, right 10/01/2010   Gait abnormality 10/01/2010    Allergies:  Allergies  Allergen  Reactions   Amoxicillin-Pot Clavulanate Anaphylaxis   Covid-19 (Mrna) Vaccine Anaphylaxis   Covid-19 Mrna Vacc (Moderna) Anaphylaxis   Macrobid  [Nitrofurantoin  Macrocrystal] Swelling and Rash   Basaglar  Kwikpen [Insulin  Glargine] Swelling   Penicillins    Avelox [Moxifloxacin Hcl In Nacl] Rash   Medications:  Current Outpatient  Medications:    clotrimazole (MYCELEX) 10 MG troche, Take 1 tablet (10 mg total) by mouth 5 (five) times daily., Disp: 140 tablet, Rfl: 1   fluconazole  (DIFLUCAN ) 150 MG tablet, Take 1 tablet (150 mg total) by mouth every three (3) days as needed., Disp: 3 tablet, Rfl: 0   ketoconazole  (NIZORAL ) 2 % cream, Apply 1 Application topically daily., Disp: 30 g, Rfl: 0   Ascorbic Acid (VITAMIN C) 1000 MG tablet, Take 1,000 mg by mouth daily., Disp: , Rfl:    busPIRone  (BUSPAR ) 10 MG tablet, Take 1 tablet (10 mg total) by mouth 3 (three) times daily., Disp: 270 tablet, Rfl: 0   cetirizine  (ZYRTEC ) 10 MG tablet, Take 1 tablet (10 mg total) by mouth daily., Disp: 90 tablet, Rfl: 3   Continuous Glucose Sensor (DEXCOM G7 SENSOR) MISC, Use to check blood sugar as directed. Change every 10 days, Disp: 9 each, Rfl: 4   Continuous Glucose Sensor (FREESTYLE LIBRE 3 PLUS SENSOR) MISC, Check BGs continuously. Change sensor every 15 days. [id number], Disp: , Rfl:    empagliflozin  (JARDIANCE ) 25 MG TABS tablet, Take 1 tablet (25 mg total) by mouth daily., Disp: 90 tablet, Rfl: 3   fluticasone  (FLONASE ) 50 MCG/ACT nasal spray, Place 1 spray into both nostrils daily., Disp: 48 g, Rfl: 1   glucose blood (FREESTYLE LITE) test strip, Use 1 strip to test blood sugar 3 (three) times daily., Disp: 300 strip, Rfl: 3   glucose blood (FREESTYLE LITE) test strip, Use to check blood sugar 3 times daily., Disp: 300 each, Rfl: 0   glucose blood test strip, Use three times daily to check blood sugar as directed, Disp: 300 each, Rfl: 3   glucose blood test strip, Use one strip to test blood sugar three times daily, Disp: 300 strip, Rfl: 3   hydrochlorothiazide  (HYDRODIURIL ) 12.5 MG tablet, Take 1 tablet (12.5 mg total) by mouth daily., Disp: 90 tablet, Rfl: 0   ibuprofen  (ADVIL ) 800 MG tablet, Take 1 tablet (800 mg total) by mouth every 8 (eight) hours as needed., Disp: 30 tablet, Rfl: 0   insulin  regular (HUMULIN  R) 100 units/mL  injection, Inject 0.2 mLs (20 Units total) into the skin 3 (three) times daily before meals., Disp: 60 mL, Rfl: 3   Insulin  Syringe-Needle U-100 (TECHLITE INSULIN  SYRINGE) 31G X 15/64 0.3 ML MISC, Use as directed 4 times a day to inject insulin , Disp: 400 each, Rfl: 3   Insulin  Syringe-Needle U-100 31G X 15/64 0.3 ML MISC, Use 4 times daily as directed, Disp: 400 each, Rfl: 4   Insulin  Syringe-Needle U-100 31G X 5/16 0.3 ML MISC, USE TO ADMINISTER INSULIN  4 TIMES DAILY AS DIRECTED, Disp: 400 each, Rfl: 3   Lancets (FREESTYLE) lancets, Test blood sugars up to 3 times daily, Disp: 300 each, Rfl: 3   lisinopril  (ZESTRIL ) 10 MG tablet, Take 1 tablet (10 mg total) by mouth daily., Disp: 90 tablet, Rfl: 4   metFORMIN  (GLUCOPHAGE ) 1000 MG tablet, Take 1 tablet (1,000 mg total) by mouth 2 (two) times daily with a meal., Disp: 180 tablet, Rfl: 4   NOVOFINE 32G X 6 MM MISC, , Disp: , Rfl:    nystatin  (MYCOSTATIN )  100000 UNIT/ML suspension, Take 5 mLs (500,000 Units total) by mouth 4 (four) times daily. Put on file, Disp: 473 mL, Rfl: 1   ondansetron  (ZOFRAN -ODT) 4 MG disintegrating tablet, Dissolve 1 tablet (4 mg total) by mouth every 8 (eight) hours as needed for nausea or vomiting., Disp: 20 tablet, Rfl: 0   pantoprazole  (PROTONIX ) 40 MG tablet, Take 1 tablet (40 mg) by mouth 2 times daily before a meal., Disp: 180 tablet, Rfl: 0   Prenatal Vit-Fe Fumarate-FA (PRENATAL VITAMIN PO), Take 1 tablet by mouth daily., Disp: , Rfl:    tirzepatide  (MOUNJARO ) 15 MG/0.5ML Pen, Inject 15 mg into the skin once a week., Disp: 6 mL, Rfl: 4   TRUEPLUS INSULIN  SYRINGE 30G X 5/16 1 ML MISC, USE TO INJECT INSULIN  4 TIMES DAILY, Disp: 300 each, Rfl: 3  Observations/Objective: Patient is well-developed, well-nourished in no acute distress.  Resting comfortably at home.  Head is normocephalic, atraumatic.  No labored breathing. Speech is clear and coherent with logical content.  Patient is alert and oriented at  baseline.   Assessment and Plan: 1. Angular cheilitis (Primary) - ketoconazole  (NIZORAL ) 2 % cream; Apply 1 Application topically daily.  Dispense: 30 g; Refill: 0  2. Oral thrush - clotrimazole (MYCELEX) 10 MG troche; Take 1 tablet (10 mg total) by mouth 5 (five) times daily.  Dispense: 140 tablet; Refill: 1 - fluconazole  (DIFLUCAN ) 150 MG tablet; Take 1 tablet (150 mg total) by mouth every three (3) days as needed.  Dispense: 3 tablet; Refill: 0 - nystatin  (MYCOSTATIN ) 100000 UNIT/ML suspension; Take 5 mLs (500,000 Units total) by mouth 4 (four) times daily. Put on file  Dispense: 473 mL; Refill: 1  3. Type 2 diabetes mellitus with both eyes affected by proliferative retinopathy and macular edema, with long-term current use of insulin  (HCC)  Will give Ketoconazole  cream BID  Continue Nystatin  oral Start diflucan  every 3 days Continue to have good control of glucose  Follow up if symptoms worsen or do not improve   Follow Up Instructions: I discussed the assessment and treatment plan with the patient. The patient was provided an opportunity to ask questions and all were answered. The patient agreed with the plan and demonstrated an understanding of the instructions.  A copy of instructions were sent to the patient via MyChart unless otherwise noted below.     The patient was advised to call back or seek an in-person evaluation if the symptoms worsen or if the condition fails to improve as anticipated.    Bari Learn, FNP

## 2024-02-17 NOTE — Patient Instructions (Signed)

## 2024-02-28 ENCOUNTER — Encounter: Payer: Self-pay | Admitting: Family Medicine

## 2024-02-28 ENCOUNTER — Ambulatory Visit (INDEPENDENT_AMBULATORY_CARE_PROVIDER_SITE_OTHER): Payer: Self-pay | Admitting: Family Medicine

## 2024-02-28 VITALS — BP 119/78 | HR 101 | Temp 98.0°F | Ht 66.0 in | Wt 246.1 lb

## 2024-02-28 DIAGNOSIS — I152 Hypertension secondary to endocrine disorders: Secondary | ICD-10-CM

## 2024-02-28 DIAGNOSIS — E113513 Type 2 diabetes mellitus with proliferative diabetic retinopathy with macular edema, bilateral: Secondary | ICD-10-CM

## 2024-02-28 DIAGNOSIS — E1169 Type 2 diabetes mellitus with other specified complication: Secondary | ICD-10-CM | POA: Diagnosis not present

## 2024-02-28 DIAGNOSIS — E1159 Type 2 diabetes mellitus with other circulatory complications: Secondary | ICD-10-CM

## 2024-02-28 DIAGNOSIS — Z0001 Encounter for general adult medical examination with abnormal findings: Secondary | ICD-10-CM

## 2024-02-28 DIAGNOSIS — Z794 Long term (current) use of insulin: Secondary | ICD-10-CM

## 2024-02-28 DIAGNOSIS — E785 Hyperlipidemia, unspecified: Secondary | ICD-10-CM

## 2024-02-28 DIAGNOSIS — F411 Generalized anxiety disorder: Secondary | ICD-10-CM

## 2024-02-28 DIAGNOSIS — K219 Gastro-esophageal reflux disease without esophagitis: Secondary | ICD-10-CM

## 2024-02-28 DIAGNOSIS — Z0000 Encounter for general adult medical examination without abnormal findings: Secondary | ICD-10-CM

## 2024-02-28 LAB — BAYER DCA HB A1C WAIVED: HB A1C (BAYER DCA - WAIVED): 7.3 % — ABNORMAL HIGH (ref 4.8–5.6)

## 2024-02-28 MED ORDER — METFORMIN HCL 1000 MG PO TABS
1000.0000 mg | ORAL_TABLET | Freq: Two times a day (BID) | ORAL | 4 refills | Status: AC
Start: 2024-02-28 — End: ?

## 2024-02-28 MED ORDER — HYDROCHLOROTHIAZIDE 12.5 MG PO TABS: 12.5000 mg | ORAL_TABLET | Freq: Every day | ORAL | 3 refills | Status: DC

## 2024-02-28 MED ORDER — BUSPIRONE HCL 10 MG PO TABS: 10.0000 mg | ORAL_TABLET | Freq: Three times a day (TID) | ORAL | 3 refills | Status: DC

## 2024-02-28 MED ORDER — INSULIN REGULAR HUMAN 100 UNIT/ML IJ SOLN: 20.0000 [IU] | Freq: Three times a day (TID) | INTRAMUSCULAR | 3 refills | Status: DC

## 2024-02-28 MED ORDER — LISINOPRIL 10 MG PO TABS: 10.0000 mg | ORAL_TABLET | Freq: Every day | ORAL | 4 refills | Status: AC

## 2024-02-28 MED ORDER — TIRZEPATIDE 15 MG/0.5ML ~~LOC~~ SOAJ: 15.0000 mg | SUBCUTANEOUS | 4 refills | Status: DC

## 2024-02-28 MED ORDER — PANTOPRAZOLE SODIUM 40 MG PO TBEC: 40.0000 mg | DELAYED_RELEASE_TABLET | Freq: Two times a day (BID) | ORAL | 3 refills | Status: AC

## 2024-02-28 NOTE — Patient Instructions (Signed)
 Preventive Care 58-43 Years Old, Female  Preventive care refers to lifestyle choices and visits with your health care provider that can promote health and wellness. Preventive care visits are also called wellness exams.  What can I expect for my preventive care visit?  Counseling  Your health care provider may ask you questions about your:  Medical history, including:  Past medical problems.  Family medical history.  Pregnancy history.  Current health, including:  Menstrual cycle.  Method of birth control.  Emotional well-being.  Home life and relationship well-being.  Sexual activity and sexual health.  Lifestyle, including:  Alcohol, nicotine or tobacco, and drug use.  Access to firearms.  Diet, exercise, and sleep habits.  Work and work Astronomer.  Sunscreen use.  Safety issues such as seatbelt and bike helmet use.  Physical exam  Your health care provider will check your:  Height and weight. These may be used to calculate your BMI (body mass index). BMI is a measurement that tells if you are at a healthy weight.  Waist circumference. This measures the distance around your waistline. This measurement also tells if you are at a healthy weight and may help predict your risk of certain diseases, such as type 2 diabetes and high blood pressure.  Heart rate and blood pressure.  Body temperature.  Skin for abnormal spots.  What immunizations do I need?    Vaccines are usually given at various ages, according to a schedule. Your health care provider will recommend vaccines for you based on your age, medical history, and lifestyle or other factors, such as travel or where you work.  What tests do I need?  Screening  Your health care provider may recommend screening tests for certain conditions. This may include:  Lipid and cholesterol levels.  Diabetes screening. This is done by checking your blood sugar (glucose) after you have not eaten for a while (fasting).  Pelvic exam and Pap test.  Hepatitis B test.  Hepatitis C  test.  HIV (human immunodeficiency virus) test.  STI (sexually transmitted infection) testing, if you are at risk.  Lung cancer screening.  Colorectal cancer screening.  Mammogram. Talk with your health care provider about when you should start having regular mammograms. This may depend on whether you have a family history of breast cancer.  BRCA-related cancer screening. This may be done if you have a family history of breast, ovarian, tubal, or peritoneal cancers.  Bone density scan. This is done to screen for osteoporosis.  Talk with your health care provider about your test results, treatment options, and if necessary, the need for more tests.  Follow these instructions at home:  Eating and drinking    Eat a diet that includes fresh fruits and vegetables, whole grains, lean protein, and low-fat dairy products.  Take vitamin and mineral supplements as recommended by your health care provider.  Do not drink alcohol if:  Your health care provider tells you not to drink.  You are pregnant, may be pregnant, or are planning to become pregnant.  If you drink alcohol:  Limit how much you have to 0-1 drink a day.  Know how much alcohol is in your drink. In the [place] one drink equals one 12 oz bottle of beer (355 mL), one 5 oz glass of wine (148 mL), or one 1 oz glass of hard liquor (44 mL).  Lifestyle  Brush your teeth every morning and night with fluoride toothpaste. Floss one time each day.  Exercise for at least  30 minutes 5 or more days each week.  Do not use any products that contain nicotine or tobacco. These products include cigarettes, chewing tobacco, and vaping devices, such as e-cigarettes. If you need help quitting, ask your health care provider.  Do not use drugs.  If you are sexually active, practice safe sex. Use a condom or other form of protection to prevent STIs.  If you do not wish to become pregnant, use a form of birth control. If you plan to become pregnant, see your health care provider for a  prepregnancy visit.  Take aspirin only as told by your health care provider. Make sure that you understand how much to take and what form to take. Work with your health care provider to find out whether it is safe and beneficial for you to take aspirin daily.  Find healthy ways to manage stress, such as:  Meditation, yoga, or listening to music.  Journaling.  Talking to a trusted person.  Spending time with friends and family.  Minimize exposure to UV radiation to reduce your risk of skin cancer.  Safety  Always wear your seat belt while driving or riding in a vehicle.  Do not drive:  If you have been drinking alcohol. Do not ride with someone who has been drinking.  When you are tired or distracted.  While texting.  If you have been using any mind-altering substances or drugs.  Wear a helmet and other protective equipment during sports activities.  If you have firearms in your house, make sure you follow all gun safety procedures.  Seek help if you have been physically or sexually abused.  What's next?  Visit your health care provider once a year for an annual wellness visit.  Ask your health care provider how often you should have your eyes and teeth checked.  Stay up to date on all vaccines.  This information is not intended to replace advice given to you by your health care provider. Make sure you discuss any questions you have with your health care provider.  Document Revised: 09/17/2020 Document Reviewed: 09/17/2020  Elsevier Patient Education  2024 [employer].

## 2024-02-28 NOTE — Progress Notes (Signed)
 Amy Ball is a 43 y.o. female presents to office today for annual physical exam examination.     Type 2 Diabetes with hypertension, hyperlipidemia:  Glucometer: Dexcom 7.  Will be switching insurance next year so to be determined if we need to switch.  She has continued to use intermittent insulin  but this is variable and depends totally upon what her blood sugars are running.  Since starting the Jardiance  she has noticed that blood sugars are typically running 130s to 160s fasting and they typically drop throughout the day.  Compliant with Mounjaro  15 mg weekly, metformin  1000 mg twice daily, Novolin R 3 times daily as needed and jardiance .  She is compliant with lisinopril  but is not on any cholesterol medication.  No hypoglycemia reported.  She does report having had thrush but she is still working on getting rid of.  Denies any vaginitis Last eye exam: UTD Last foot exam: needs Last A1c:  Lab Results  Component Value Date   HGBA1C 7.5 (H) 09/14/2023   Nephropathy screen indicated?: UTD Last flu, zoster and/or pneumovax:  Immunization History  Administered Date(s) Administered   Influenza Inj Mdck Quad With Preservative 01/19/2018   Influenza Split 01/21/2016, 01/13/2018, 02/05/2019   Influenza,inj,Quad PF,6+ Mos 01/25/2022, 01/19/2023   Influenza-Unspecified 02/16/2006, 02/06/2014, 01/22/2017, 01/19/2018, 02/25/2020, 01/13/2021, 01/17/2021   PFIZER(Purple Top)SARS-COV-2 Vaccination 06/10/2019   PPD Test 02/16/2006   Pneumococcal Polysaccharide-23 04/13/2018    ROS: no dizziness, LOC, polyuria, polydipsia, unintended weight loss/gain, foot ulcerations, numbness or tingling in extremities, shortness of breath or chest pain.   Occupation: engineer, site, Marital status: married, Substance use: none Health Maintenance Due  Topic Date Due   Hepatitis B Vaccines 19-59 Average Risk (1 of 3 - 19+ 3-dose series) Never done   HPV VACCINES (1 - 3-dose SCDM series) Never done    COVID-19 Vaccine (2 - 2025-26 season) 12/05/2023    Immunization History  Administered Date(s) Administered   Influenza Inj Mdck Quad With Preservative 01/19/2018   Influenza Split 01/21/2016, 01/13/2018, 02/05/2019   Influenza,inj,Quad PF,6+ Mos 01/25/2022, 01/19/2023   Influenza-Unspecified 02/16/2006, 02/06/2014, 01/22/2017, 01/19/2018, 02/25/2020, 01/13/2021, 01/17/2021   PFIZER(Purple Top)SARS-COV-2 Vaccination 06/10/2019   PPD Test 02/16/2006   Pneumococcal Polysaccharide-23 04/13/2018   Past Medical History:  Diagnosis Date   Environmental allergies    GAD (generalized anxiety disorder) 12/05/2018   GERD (gastroesophageal reflux disease)    Hypercortisolemia 03/14/2020   Hyperlipidemia associated with type 2 diabetes mellitus (HCC) 09/14/2023   Hypertension    Type 2 diabetes mellitus (HCC)    Social History   Socioeconomic History   Marital status: Married    Spouse name: Therapist, Nutritional   Number of children: Not on file   Years of education: 18   Highest education level: Master's degree (e.g., MA, MS, MEng, MEd, MSW, MBA)  Occupational History   Occupation: third grade teacher  Tobacco Use   Smoking status: Never   Smokeless tobacco: Never  Vaping Use   Vaping status: Never Used  Substance and Sexual Activity   Alcohol use: Yes    Comment: occasionally   Drug use: No   Sexual activity: Yes  Other Topics Concern   Not on file  Social History Narrative   Engaged to be married December 8 to [place]   Social Drivers of [employer] Strain: Low Risk  (02/28/2024)   Overall Financial Resource Strain (CARDIA)    Difficulty of Paying Living Expenses: Not very hard  Food Insecurity: No Food  Insecurity (02/28/2024)   Hunger Vital Sign    Worried About Running Out of Food in the Last Year: Never true    Ran Out of Food in the Last Year: Never true  Transportation Needs: No Transportation Needs (02/28/2024)   PRAPARE - Scientist, Research (physical Sciences) (Medical): No    Lack of Transportation (Non-Medical): No  Physical Activity: Insufficiently Active (02/28/2024)   Exercise Vital Sign    Days of Exercise per Week: 3 days    Minutes of Exercise per Session: 30 min  Stress: No Stress Concern Present (02/28/2024)   [employer] of Occupational Health - Occupational Stress Questionnaire    Feeling of Stress: Only a little  Social Connections: Socially Integrated (02/28/2024)   Social Connection and Isolation Panel    Frequency of Communication with Friends and Family: More than three times a week    Frequency of Social Gatherings with Friends and Family: More than three times a week    Attends Religious Services: More than 4 times per year    Active Member of [employer] or Organizations: Yes    Attends Banker Meetings: More than 4 times per year    Marital Status: Married  Catering Manager Violence: Unknown (07/09/2021)   Received from [facility]   HITS    Physically Hurt: Not on file    Insult or Talk Down To: Not on file    Threaten Physical Harm: Not on file    Scream or Curse: Not on file   Past Surgical History:  Procedure Laterality Date   FRACTURE SURGERY  2007   right ankle   Family History  Problem Relation Age of Onset   Birth defects Mother        one hand small   Arthritis Father    Diabetes Father    Heart disease Father 46       heart attack   Psoriasis Father    Psoriasis Brother    Stroke Paternal Grandmother        age 37   Cancer Paternal Grandfather     Current Outpatient Medications:    Ascorbic Acid (VITAMIN C) 1000 MG tablet, Take 1,000 mg by mouth daily., Disp: , Rfl:    cetirizine  (ZYRTEC ) 10 MG tablet, Take 1 tablet (10 mg total) by mouth daily., Disp: 90 tablet, Rfl: 3   empagliflozin  (JARDIANCE ) 25 MG TABS tablet, Take 1 tablet (25 mg total) by mouth daily., Disp: 90 tablet, Rfl: 3   glucose blood (FREESTYLE LITE) test strip, Use to check blood sugar 3 times  daily., Disp: 300 each, Rfl: 0   glucose blood test strip, Use one strip to test blood sugar three times daily, Disp: 300 strip, Rfl: 3   ibuprofen  (ADVIL ) 800 MG tablet, Take 1 tablet (800 mg total) by mouth every 8 (eight) hours as needed., Disp: 30 tablet, Rfl: 0   Insulin  Syringe-Needle U-100 (TECHLITE INSULIN  SYRINGE) 31G X 15/64 0.3 ML MISC, Use as directed 4 times a day to inject insulin , Disp: 400 each, Rfl: 3   ketoconazole  (NIZORAL ) 2 % cream, Apply 1 Application topically daily., Disp: 30 g, Rfl: 0   Lancets (FREESTYLE) lancets, Test blood sugars up to 3 times daily, Disp: 300 each, Rfl: 3   nystatin  (MYCOSTATIN ) 100000 UNIT/ML suspension, Take 5 mLs (500,000 Units total) by mouth 4 (four) times daily. Put on file, Disp: 473 mL, Rfl: 1   ondansetron  (ZOFRAN -ODT) 4 MG disintegrating tablet, Dissolve 1 tablet (  4 mg total) by mouth every 8 (eight) hours as needed for nausea or vomiting., Disp: 20 tablet, Rfl: 0   Prenatal Vit-Fe Fumarate-FA (PRENATAL VITAMIN PO), Take 1 tablet by mouth daily., Disp: , Rfl:    busPIRone  (BUSPAR ) 10 MG tablet, Take 1 tablet (10 mg total) by mouth 3 (three) times daily., Disp: 270 tablet, Rfl: 3   Continuous Glucose Sensor (DEXCOM G7 SENSOR) MISC, Use to check blood sugar as directed. Change every 10 days (Patient not taking: Reported on 02/28/2024), Disp: 9 each, Rfl: 4   fluconazole  (DIFLUCAN ) 150 MG tablet, Take 1 tablet (150 mg total) by mouth every three (3) days as needed. (Patient not taking: Reported on 02/28/2024), Disp: 3 tablet, Rfl: 0   fluticasone  (FLONASE ) 50 MCG/ACT nasal spray, Place 1 spray into both nostrils daily. (Patient not taking: Reported on 02/28/2024), Disp: 48 g, Rfl: 1   hydrochlorothiazide  (HYDRODIURIL ) 12.5 MG tablet, Take 1 tablet (12.5 mg total) by mouth daily., Disp: 90 tablet, Rfl: 3   insulin  regular (HUMULIN  R) 100 units/mL injection, Inject 0.2 mLs (20 Units total) into the skin 3 (three) times daily before meals., Disp: 60  mL, Rfl: 3   lisinopril  (ZESTRIL ) 10 MG tablet, Take 1 tablet (10 mg total) by mouth daily., Disp: 90 tablet, Rfl: 4   metFORMIN  (GLUCOPHAGE ) 1000 MG tablet, Take 1 tablet (1,000 mg total) by mouth 2 (two) times daily with a meal., Disp: 180 tablet, Rfl: 4   pantoprazole  (PROTONIX ) 40 MG tablet, Take 1 tablet (40 mg) by mouth 2 times daily before a meal., Disp: 180 tablet, Rfl: 3   tirzepatide  (MOUNJARO ) 15 MG/0.5ML Pen, Inject 15 mg into the skin once a week., Disp: 6 mL, Rfl: 4  Allergies  Allergen Reactions   Amoxicillin-Pot Clavulanate Anaphylaxis   Covid-19 (Mrna) Vaccine Anaphylaxis   Covid-19 Mrna Vacc (Moderna) Anaphylaxis   Macrobid  [Nitrofurantoin  Macrocrystal] Swelling and Rash   Basaglar  Kwikpen [Insulin  Glargine] Swelling   Penicillins    Avelox [Moxifloxacin Hcl In Nacl] Rash     ROS: Review of Systems Pertinent items noted in HPI and remainder of comprehensive ROS otherwise negative.    Physical exam BP 119/78   Pulse (!) 101   Temp 98 F (36.7 C)   Ht 5' 6 (1.676 m)   Wt 246 lb 2 oz (111.6 kg)   SpO2 98%   BMI 39.73 kg/m  General appearance: alert, cooperative, appears stated age, no distress, and morbidly obese Head: Normocephalic, without obvious abnormality, atraumatic Eyes: negative findings: lids and lashes normal, conjunctivae and sclerae normal, corneas clear, and pupils equal, round, reactive to light and accomodation Ears: normal TM's and external ear canals both ears Nose: Nares normal. Septum midline. Mucosa normal. No drainage or sinus tenderness. Throat: lips, mucosa, and tongue normal; teeth and gums normal Neck: no adenopathy, supple, symmetrical, trachea midline, and thyroid  not enlarged, symmetric, no tenderness/mass/nodules Back: symmetric, no curvature. ROM normal. No CVA tenderness. Lungs: clear to auscultation bilaterally Heart: regular rate and rhythm, S1, S2 normal, no murmur, click, rub or gallop Abdomen: Obese, soft, nontender.  No  hepato or splenomegaly palpable but exam somewhat limited by body habitus Extremities: extremities normal, atraumatic, no cyanosis or edema Pulses: 2+ and symmetric Skin: Skin color, texture, turgor normal. No rashes or lesions Lymph nodes: No supraclavicular or anterior cervical lymph node enlargement Neurologic: Alert and oriented X 3, normal strength and tone. Normal symmetric reflexes. Normal coordination and gait      01/17/2024  3:02 PM 09/14/2023   11:16 AM 05/24/2023    2:33 PM  Depression screen PHQ 2/9  Decreased Interest 0 0 0  Down, Depressed, Hopeless 0 0 0  PHQ - 2 Score 0 0 0  Altered sleeping 0 1 0  Tired, decreased energy 1 1 0  Change in appetite 0 0 0  Feeling bad or failure about yourself  0 0 0  Trouble concentrating 0 0 0  Moving slowly or fidgety/restless 0 0 0  Suicidal thoughts 0 0 0  PHQ-9 Score 1  2  0   Difficult doing work/chores Not difficult at all Not difficult at all Not difficult at all     Data saved with a previous flowsheet row definition      01/17/2024    3:02 PM 09/14/2023   11:15 AM 05/24/2023    2:33 PM 01/26/2023    3:08 PM  GAD 7 : Generalized Anxiety Score  Nervous, Anxious, on Edge 1 1 0 0  Control/stop worrying 0 0 0 0  Worry too much - different things 1 0 0 0  Trouble relaxing 0 1 0 0  Restless 0 0 0 0  Easily annoyed or irritable 0 0 0 0  Afraid - awful might happen 0 0 0 0  Total GAD 7 Score 2 2 0 0  Anxiety Difficulty Not difficult at all Not difficult at all Not difficult at all     No results found for this or any previous visit (from the past 2160 hours).   Assessment/ Plan: Amy Ball here for annual physical exam.   Annual physical exam  Type 2 diabetes mellitus with both eyes affected by proliferative retinopathy and macular edema, with long-term current use of insulin  (HCC) - Plan: Bayer DCA Hb A1c Waived, CMP14+EGFR, insulin  regular (HUMULIN  R) 100 units/mL injection, metFORMIN  (GLUCOPHAGE ) 1000 MG  tablet, tirzepatide  (MOUNJARO ) 15 MG/0.5ML Pen  Hypertension associated with diabetes (HCC) - Plan: CMP14+EGFR, hydrochlorothiazide  (HYDRODIURIL ) 12.5 MG tablet, lisinopril  (ZESTRIL ) 10 MG tablet  Hyperlipidemia associated with type 2 diabetes mellitus (HCC) - Plan: CMP14+EGFR  Gastroesophageal reflux disease without esophagitis - Plan: pantoprazole  (PROTONIX ) 40 MG tablet  GAD (generalized anxiety disorder) - Plan: busPIRone  (BUSPAR ) 10 MG tablet   Check A1c.  Diabetic foot exam performed.  Keep appointment with Dr. Faythe in January  Blood pressure well-controlled.  No changes.  Check renal function, liver enzymes  Not currently treated with statin but can certainly start back on 1 since we are not planning for family right now.  Not due for fasting lipid  PPI renewed.  Anxiety stable.  BuSpar  renewed   Counseled on healthy lifestyle choices, including diet (rich in fruits, vegetables and lean meats and low in salt and simple carbohydrates) and exercise (at least 30 minutes of moderate physical activity daily).  Patient to follow up 3-10m for DM  Amy Ball M. Jolinda, DO

## 2024-02-29 ENCOUNTER — Ambulatory Visit: Payer: Self-pay | Admitting: Family Medicine

## 2024-02-29 LAB — CMP14+EGFR
ALT: 15 IU/L (ref 0–32)
AST: 13 IU/L (ref 0–40)
Albumin: 4.2 g/dL (ref 3.9–4.9)
Alkaline Phosphatase: 81 IU/L (ref 41–116)
BUN/Creatinine Ratio: 24 — ABNORMAL HIGH (ref 9–23)
BUN: 20 mg/dL (ref 6–24)
Bilirubin Total: 0.2 mg/dL (ref 0.0–1.2)
CO2: 21 mmol/L (ref 20–29)
Calcium: 9.4 mg/dL (ref 8.7–10.2)
Chloride: 98 mmol/L (ref 96–106)
Creatinine, Ser: 0.83 mg/dL (ref 0.57–1.00)
Globulin, Total: 2.9 g/dL (ref 1.5–4.5)
Glucose: 115 mg/dL — ABNORMAL HIGH (ref 70–99)
Potassium: 4 mmol/L (ref 3.5–5.2)
Sodium: 135 mmol/L (ref 134–144)
Total Protein: 7.1 g/dL (ref 6.0–8.5)
eGFR: 90 mL/min/1.73 (ref >59)

## 2024-03-05 ENCOUNTER — Other Ambulatory Visit (HOSPITAL_COMMUNITY): Payer: Self-pay

## 2024-03-06 ENCOUNTER — Other Ambulatory Visit: Payer: Self-pay

## 2024-03-09 ENCOUNTER — Other Ambulatory Visit (HOSPITAL_COMMUNITY): Payer: Self-pay

## 2024-03-09 MED ORDER — FREESTYLE LITE TEST VI STRP
ORAL_STRIP | 0 refills | Status: AC
  Filled 2024-03-09: qty 300, 90d supply, fill #0

## 2024-03-10 ENCOUNTER — Other Ambulatory Visit (HOSPITAL_COMMUNITY): Payer: Self-pay

## 2024-03-20 ENCOUNTER — Encounter: Payer: Self-pay | Admitting: Family Medicine

## 2024-03-20 ENCOUNTER — Other Ambulatory Visit: Payer: Self-pay | Admitting: Family Medicine

## 2024-03-20 ENCOUNTER — Other Ambulatory Visit (HOSPITAL_COMMUNITY): Payer: Self-pay

## 2024-03-20 DIAGNOSIS — R600 Localized edema: Secondary | ICD-10-CM

## 2024-03-20 DIAGNOSIS — E113513 Type 2 diabetes mellitus with proliferative diabetic retinopathy with macular edema, bilateral: Secondary | ICD-10-CM

## 2024-03-20 DIAGNOSIS — I10 Essential (primary) hypertension: Secondary | ICD-10-CM

## 2024-03-20 DIAGNOSIS — I152 Hypertension secondary to endocrine disorders: Secondary | ICD-10-CM

## 2024-03-20 DIAGNOSIS — K219 Gastro-esophageal reflux disease without esophagitis: Secondary | ICD-10-CM

## 2024-03-21 ENCOUNTER — Other Ambulatory Visit (HOSPITAL_COMMUNITY): Payer: Self-pay

## 2024-03-21 ENCOUNTER — Other Ambulatory Visit: Payer: Self-pay

## 2024-03-21 MED ORDER — TECHLITE INSULIN SYRINGE 31G X 15/64" 0.3 ML MISC
4 refills | Status: AC
  Filled 2024-03-21: qty 300, 75d supply, fill #0

## 2024-03-21 MED ORDER — TIRZEPATIDE 15 MG/0.5ML ~~LOC~~ SOAJ
15.0000 mg | SUBCUTANEOUS | 4 refills | Status: AC
  Filled 2024-03-21 – 2024-03-26 (×3): qty 6, 84d supply, fill #0

## 2024-03-21 MED ORDER — INSULIN REGULAR HUMAN 100 UNIT/ML IJ SOLN
20.0000 [IU] | Freq: Three times a day (TID) | INTRAMUSCULAR | 0 refills | Status: AC
  Filled 2024-03-21: qty 50, 84d supply, fill #0

## 2024-03-22 ENCOUNTER — Other Ambulatory Visit: Payer: Self-pay

## 2024-03-24 ENCOUNTER — Other Ambulatory Visit (HOSPITAL_COMMUNITY): Payer: Self-pay

## 2024-03-26 ENCOUNTER — Other Ambulatory Visit: Payer: Self-pay

## 2024-03-26 ENCOUNTER — Other Ambulatory Visit (HOSPITAL_COMMUNITY): Payer: Self-pay

## 2024-03-27 ENCOUNTER — Ambulatory Visit: Admitting: Family Medicine

## 2024-04-08 ENCOUNTER — Telehealth: Admitting: Nurse Practitioner

## 2024-04-08 ENCOUNTER — Encounter: Payer: Self-pay | Admitting: Family Medicine

## 2024-04-08 DIAGNOSIS — B370 Candidal stomatitis: Secondary | ICD-10-CM | POA: Diagnosis not present

## 2024-04-08 MED ORDER — NYSTATIN 100000 UNIT/ML MT SUSP: 5.0000 mL | Freq: Four times a day (QID) | OROMUCOSAL | 1 refills | Status: AC

## 2024-04-08 NOTE — Progress Notes (Signed)
 E-Visit Treatment for Thrush symptoms  We are sorry that you are not feeling well. Here is how we plan to help!  Based on what you have shared with me, it appears that you have Thrush.  Amy Ball is a fungal (yeast) infection that can grow in your mouth, throat, and other parts of your body. With oral thrush (oral candidiasis), you may develop white, raised, cottage cheese-like lesions (spots) on your tongue and cheeks. Amy Ball can quickly become irritated and cause mouth pain and redness. Thrush usually develops suddenly. A common sign is the presence of creamy white, slightly raised lesions in your mouth -- usually on your tongue or inner cheeks. You may also have lesions on the roof of your mouth, gums, tonsils, or back of your throat.   Other symptoms may include: Redness and soreness inside and at the corners of your mouth Loss of sense of taste ([place]) Cottony feeling in your mouth  The lesions can hurt and may bleed a little when you scrape them or brush your teeth. In severe cases, the lesions can spread into your esophagus and cause: Pain or difficulty swallowing A feeling that food gets stuck in your throat or mid-chest area Fever, if the infection spreads beyond your esophagus  Most people have small amounts of the Candida fungus in their mouth, digestive tract and skin. When illnesses, stress or medications disturb this balance, the fungus grows out of control and causes thrush.  Medications that can make yeast flourish and cause infection include: Corticosteroids Inhalers Antibiotics Birth Control Pills  Amy Ball can be contagious to those at risk (like people with weakened immune systems or who take certain medications). In people with healthy immune systems, it's unusual to pass thrush through kissing or other close contact. In most cases, thrush isn't particularly contagious (meaning, it doesn't spread from person to person), but it is transmittable (meaning, you can catch it  in other ways, like through saliva when you are immunocompromised).  I have prescribed I have prescribed an antifungal - Nystatin  suspension 100,000 units/mL Swish and swallow 5mL every 6 hours for up to 10-14 days  Please follow up with Dr. Jolinda for long term therapy such as pill diflucan .  Prevention: Practice good oral hygiene. See your dentist regularly. This is especially important if you have diabetes or wear dentures. Limit the amount of sugar and yeast-containing foods you eat. Foods such as bread, beer and wine encourage Candida growth. Avoid smoking and other tobacco use. Ask your healthcare provider about ways to help you quit smoking (We do offer a smoking cessation program through the [facility] that you can schedule on your time through MyChart). With treatment, thrush usually goes away within one to two weeks. But if your symptoms linger or get worse, please seek in-person evaluation.  Home Care: Swish with warm saltwater. Take probiotics. Eat yogurt that contains healthy bacteria.  GET HELP RIGHT AWAY IF: Ulcers that are spreading, are very large or particularly painful Ulcers last longer than one week without improving on treatment If you develop a fever, swollen glands and begin to feel unwell  MAKE SURE YOU: Understand these instructions Will watch your condition. Will get help right away if you are not doing well or get worse.  Thank you for choosing an e-visit!  Your e-visit answers were reviewed by a board certified advanced clinical practitioner to complete your personal care plan. Depending upon the condition, your plan could have included both over the counter or  prescription medications.  Please review your pharmacy choice. Make sure the pharmacy is open so you can pick up prescription now. If there is a problem, you may contact your provider through [employer] and have the prescription routed to another pharmacy. Your safety  is important to us . If you have drug allergies, check your prescription carefully.  For the next 24 hours you can use MyChart to ask questions about today's visit, request a non-urgent call back, or ask for a work or school excuse.  You will get an email in the next two days asking about your experience. I hope that your e-visit has been valuable and will speed your recovery.

## 2024-04-09 ENCOUNTER — Other Ambulatory Visit: Payer: Self-pay

## 2024-04-10 ENCOUNTER — Encounter: Payer: Self-pay | Admitting: Family Medicine

## 2024-04-10 ENCOUNTER — Telehealth: Payer: Self-pay | Admitting: Family Medicine

## 2024-04-10 DIAGNOSIS — B3781 Candidal esophagitis: Secondary | ICD-10-CM | POA: Diagnosis not present

## 2024-04-10 DIAGNOSIS — K219 Gastro-esophageal reflux disease without esophagitis: Secondary | ICD-10-CM

## 2024-04-10 MED ORDER — FLUCONAZOLE 150 MG PO TABS: 150.0000 mg | ORAL_TABLET | ORAL | 0 refills | Status: AC

## 2024-04-10 NOTE — Progress Notes (Signed)
 MyChart Video visit  Subjective: RR:uymldy PCP: Jolinda Norene HERO, DO Amy Ball is a 44 y.o. female. Patient provides verbal consent for consult held via video.  Due to COVID-19 pandemic this visit was conducted virtually. This visit type was conducted due to national recommendations for restrictions regarding the COVID-19 Pandemic (e.g. social distancing, sheltering in place) in an effort to limit this patient's exposure and mitigate transmission in our community. All issues noted in this document were discussed and addressed.  A physical exam was not performed with this format.   Location of patient: car Location of provider: [facility] Others present for call: none  1. Thrush She reports that she had recurrent thrush over Christmas break. She took diflucan  q3 days.  She notes that symptoms got better but this past Saturday it started again.  She started back on the nystatin  swish. Tries to use probiotics through food. She has GERD and is on PPI.     ROS: Per HPI  Allergies[1] Past Medical History:  Diagnosis Date   Environmental allergies    GAD (generalized anxiety disorder) 12/05/2018   GERD (gastroesophageal reflux disease)    Hypercortisolemia 03/14/2020   Hyperlipidemia associated with type 2 diabetes mellitus (HCC) 09/14/2023   Hypertension    Type 2 diabetes mellitus (HCC)    Current Medications[2]  Gen: well appearing female, NAD  Assessment/ Plan: 44 y.o. female   Esophageal thrush (HCC) - Plan: Ambulatory referral to Gastroenterology, fluconazole  (DIFLUCAN ) 150 MG tablet  Gastroesophageal reflux disease without esophagitis - Plan: Ambulatory referral to Gastroenterology  5 weeks of Diflucan .  Referral to [facility] for consideration of evaluation under EGD.  Start time: 2:50pm End time: 3:00pm  Total time spent on patient care (including video visit/ documentation): 12 minutes  Everitt Wenner M Freddrick Gladson, DO [facility] [phone]      [1]  Allergies Allergen Reactions   Amoxicillin-Pot Clavulanate Anaphylaxis   Covid-19 (Mrna) Vaccine Anaphylaxis   Covid-19 Mrna Vacc (Moderna) Anaphylaxis   Macrobid  [Nitrofurantoin  Macrocrystal] Swelling and Rash   Basaglar  Kwikpen [Insulin  Glargine] Swelling   Penicillins    Avelox [Moxifloxacin Hcl In Nacl] Rash  [2]  Current Outpatient Medications:    Ascorbic Acid (VITAMIN C) 1000 MG tablet, Take 1,000 mg by mouth daily., Disp: , Rfl:    busPIRone  (BUSPAR ) 10 MG tablet, Take 1 tablet (10 mg total) by mouth 3 (three) times daily., Disp: 270 tablet, Rfl: 3   cetirizine  (ZYRTEC ) 10 MG tablet, Take 1 tablet (10 mg total) by mouth daily., Disp: 90 tablet, Rfl: 3   Continuous Glucose Sensor (DEXCOM G7 SENSOR) MISC, Use to check blood sugar as directed. Change every 10 days (Patient not taking: Reported on 02/28/2024), Disp: 9 each, Rfl: 4   empagliflozin  (JARDIANCE ) 25 MG TABS tablet, Take 1 tablet (25 mg total) by mouth daily., Disp: 90 tablet, Rfl: 3   fluconazole  (DIFLUCAN ) 150 MG tablet, Take 1 tablet (150 mg total) by mouth every three (3) days as needed. (Patient not taking: Reported on 02/28/2024), Disp: 3 tablet, Rfl: 0   fluticasone  (FLONASE ) 50 MCG/ACT nasal spray, Place 1 spray into both nostrils daily. (Patient not taking: Reported on 02/28/2024), Disp: 48 g, Rfl: 1   glucose blood (FREESTYLE LITE) test strip, Use to check blood sugar 3 times daily., Disp: 300 each, Rfl: 0   glucose blood test strip, Use one strip to test blood sugar three times daily, Disp: 300 strip, Rfl: 3   hydrochlorothiazide  (HYDRODIURIL ) 12.5 MG tablet, Take  1 tablet (12.5 mg total) by mouth daily., Disp: 90 tablet, Rfl: 3   ibuprofen  (ADVIL ) 800 MG tablet, Take 1 tablet (800 mg total) by mouth every 8 (eight) hours as needed., Disp: 30 tablet, Rfl: 0   insulin  regular (HUMULIN  R) 100 units/mL injection, Inject 20 Units total into the skin 3 (three) times daily before meals., Disp: 60 mL,  Rfl: 0   Insulin  Syringe-Needle U-100 (TECHLITE INSULIN  SYRINGE) 31G X 15/64 0.3 ML MISC, Use as directed 4 times a day to inject insulin , Disp: 400 each, Rfl: 3   Insulin  Syringe-Needle U-100 (TECHLITE INSULIN  SYRINGE) 31G X 15/64 0.3 ML MISC, Use to inject insulin  4 times a day as directed., Disp: 400 each, Rfl: 4   ketoconazole  (NIZORAL ) 2 % cream, Apply 1 Application topically daily., Disp: 30 g, Rfl: 0   Lancets (FREESTYLE) lancets, Test blood sugars up to 3 times daily, Disp: 300 each, Rfl: 3   lisinopril  (ZESTRIL ) 10 MG tablet, Take 1 tablet (10 mg total) by mouth daily., Disp: 90 tablet, Rfl: 4   metFORMIN  (GLUCOPHAGE ) 1000 MG tablet, Take 1 tablet (1,000 mg total) by mouth 2 (two) times daily with a meal., Disp: 180 tablet, Rfl: 4   nystatin  (MYCOSTATIN ) 100000 UNIT/ML suspension, Take 5 mLs (500,000 Units total) by mouth 4 (four) times daily. Put on file, Disp: 473 mL, Rfl: 1   ondansetron  (ZOFRAN -ODT) 4 MG disintegrating tablet, Dissolve 1 tablet (4 mg total) by mouth every 8 (eight) hours as needed for nausea or vomiting., Disp: 20 tablet, Rfl: 0   pantoprazole  (PROTONIX ) 40 MG tablet, Take 1 tablet (40 mg) by mouth 2 times daily before a meal., Disp: 180 tablet, Rfl: 3   Prenatal Vit-Fe Fumarate-FA (PRENATAL VITAMIN PO), Take 1 tablet by mouth daily., Disp: , Rfl:    tirzepatide  (MOUNJARO ) 15 MG/0.5ML Pen, Inject 15 mg into the skin once a week., Disp: 6 mL, Rfl: 4

## 2024-04-11 ENCOUNTER — Other Ambulatory Visit (HOSPITAL_COMMUNITY): Payer: Self-pay

## 2024-04-11 NOTE — Telephone Encounter (Signed)
 Pt insurance has been updated

## 2024-04-13 ENCOUNTER — Encounter: Payer: Self-pay | Admitting: Family Medicine

## 2024-04-13 DIAGNOSIS — R6889 Other general symptoms and signs: Secondary | ICD-10-CM | POA: Diagnosis not present

## 2024-04-13 MED ORDER — OSELTAMIVIR PHOSPHATE 75 MG PO CAPS: 75.0000 mg | ORAL_CAPSULE | Freq: Two times a day (BID) | ORAL | 0 refills | Status: AC

## 2024-04-13 NOTE — Telephone Encounter (Signed)

## 2024-04-16 ENCOUNTER — Other Ambulatory Visit: Payer: Self-pay | Admitting: Family Medicine

## 2024-04-16 ENCOUNTER — Other Ambulatory Visit (HOSPITAL_COMMUNITY): Payer: Self-pay

## 2024-04-16 ENCOUNTER — Ambulatory Visit: Admitting: Family Medicine

## 2024-04-16 ENCOUNTER — Other Ambulatory Visit: Payer: Self-pay

## 2024-04-16 DIAGNOSIS — R600 Localized edema: Secondary | ICD-10-CM

## 2024-04-16 DIAGNOSIS — I10 Essential (primary) hypertension: Secondary | ICD-10-CM

## 2024-04-16 MED ORDER — HYDROCHLOROTHIAZIDE 12.5 MG PO TABS
12.5000 mg | ORAL_TABLET | Freq: Every day | ORAL | 2 refills | Status: AC
  Filled 2024-04-16: qty 90, 90d supply, fill #0

## 2024-04-16 MED ORDER — BUSPIRONE HCL 10 MG PO TABS
10.0000 mg | ORAL_TABLET | Freq: Three times a day (TID) | ORAL | 2 refills | Status: AC
  Filled 2024-04-16: qty 270, 90d supply, fill #0

## 2024-04-17 ENCOUNTER — Other Ambulatory Visit: Payer: Self-pay

## 2024-04-17 ENCOUNTER — Other Ambulatory Visit (HOSPITAL_COMMUNITY): Payer: Self-pay

## 2024-04-18 ENCOUNTER — Other Ambulatory Visit (HOSPITAL_BASED_OUTPATIENT_CLINIC_OR_DEPARTMENT_OTHER): Payer: Self-pay

## 2024-04-18 ENCOUNTER — Other Ambulatory Visit (HOSPITAL_COMMUNITY): Payer: Self-pay

## 2024-04-26 ENCOUNTER — Encounter: Payer: Self-pay | Admitting: Family Medicine

## 2024-04-27 ENCOUNTER — Telehealth: Admitting: Family Medicine

## 2024-04-27 ENCOUNTER — Encounter: Payer: Self-pay | Admitting: Family Medicine

## 2024-04-27 DIAGNOSIS — Z7984 Long term (current) use of oral hypoglycemic drugs: Secondary | ICD-10-CM | POA: Diagnosis not present

## 2024-04-27 DIAGNOSIS — Z794 Long term (current) use of insulin: Secondary | ICD-10-CM | POA: Diagnosis not present

## 2024-04-27 DIAGNOSIS — J0100 Acute maxillary sinusitis, unspecified: Secondary | ICD-10-CM | POA: Diagnosis not present

## 2024-04-27 DIAGNOSIS — B370 Candidal stomatitis: Secondary | ICD-10-CM

## 2024-04-27 DIAGNOSIS — E113513 Type 2 diabetes mellitus with proliferative diabetic retinopathy with macular edema, bilateral: Secondary | ICD-10-CM | POA: Diagnosis not present

## 2024-04-27 MED ORDER — EMPAGLIFLOZIN 10 MG PO TABS: 10.0000 mg | ORAL_TABLET | Freq: Every day | ORAL | 0 refills | Status: AC

## 2024-04-27 NOTE — Progress Notes (Signed)
 MyChart Video visit  Subjective: Amy Ball PCP: Amy Ball HERO, DO YEP:Zopsjazuy H Beed is a 44 y.o. female. Patient provides verbal consent for consult held via video.  Due to COVID-19 pandemic this visit was conducted virtually. This visit type was conducted due to national recommendations for restrictions regarding the COVID-19 Pandemic (e.g. social distancing, sheltering in place) in an effort to limit this patient's exposure and mitigate transmission in our community. All issues noted in this document were discussed and addressed.  A physical exam was not performed with this format.   Location of patient: home Location of provider: [facility] Others present for call: none  She is on week 3 of Diflucan  for suspected esophageal thrush.  She has appointment with gastroenterology coming up soon.  She reports she still gets intermittent episodes of oral soreness.  She was wondering if maybe she needed to go back to the nystatin  oral rinse.  She also reports sinus fullness but denies any purulent materials, fevers, chills or other signs of bacterial sinusitis.  She has not been utilizing her Flonase .  She continues to take Jardiance  25 mg daily and wonders if maybe reducing dose might help   ROS: Per HPI  Allergies[1] Past Medical History:  Diagnosis Date   Environmental allergies    GAD (generalized anxiety disorder) 12/05/2018   GERD (gastroesophageal reflux disease)    Hypercortisolemia 03/14/2020   Hyperlipidemia associated with type 2 diabetes mellitus (HCC) 09/14/2023   Hypertension    Type 2 diabetes mellitus (HCC)    Current Medications[2] Gen: Nontoxic-appearing obese female HEENT: No gross facial swelling.  No scleral injection.  Assessment/ Plan: 44 y.o. female   Acute non-recurrent maxillary sinusitis  Type 2 diabetes mellitus with both eyes affected by proliferative retinopathy and macular edema, with long-term current use of insulin  (HCC) - Plan: empagliflozin   (JARDIANCE ) 10 MG TABS tablet  Oral thrush  Resume use of Flonase .  Should this progress into bacterial symptoms, she will contact me.  Okay to resume use of the nystatin  rinse.  We will reduce Jardiance  to 10 mg daily to see if this might help.  Keep appointment with gastroenterology for further evaluation.  Start time: 3:44pm End time: 3:54pm  Total time spent on patient care (including video visit/ documentation): 15 minutes  Amy Ball M Rhealyn Cullen, DO [facility] [phone]      [1]  Allergies Allergen Reactions   Amoxicillin-Pot Clavulanate Anaphylaxis   Covid-19 (Mrna) Vaccine Anaphylaxis   Covid-19 Mrna Vacc (Moderna) Anaphylaxis   Macrobid  [Nitrofurantoin  Macrocrystal] Swelling and Rash   Basaglar  Kwikpen [Insulin  Glargine] Swelling   Penicillins    Avelox [Moxifloxacin Hcl In Nacl] Rash  [2]  Current Outpatient Medications:    Ascorbic Acid (VITAMIN C) 1000 MG tablet, Take 1,000 mg by mouth daily., Disp: , Rfl:    busPIRone  (BUSPAR ) 10 MG tablet, Take 1 tablet (10 mg total) by mouth 3 (three) times daily., Disp: 270 tablet, Rfl: 2   cetirizine  (ZYRTEC ) 10 MG tablet, Take 1 tablet (10 mg total) by mouth daily., Disp: 90 tablet, Rfl: 3   Continuous Glucose Sensor (DEXCOM G7 SENSOR) MISC, Use to check blood sugar as directed. Change every 10 days (Patient not taking: Reported on 02/28/2024), Disp: 9 each, Rfl: 4   empagliflozin  (JARDIANCE ) 25 MG TABS tablet, Take 1 tablet (25 mg total) by mouth daily., Disp: 90 tablet, Rfl: 3   fluconazole  (DIFLUCAN ) 150 MG tablet, Take 1 tablet (150 mg total) by mouth every 7 (seven) days., Disp:  5 tablet, Rfl: 0   fluticasone  (FLONASE ) 50 MCG/ACT nasal spray, Place 1 spray into both nostrils daily. (Patient not taking: Reported on 02/28/2024), Disp: 48 g, Rfl: 1   glucose blood (FREESTYLE LITE) test strip, Use to check blood sugar 3 times daily., Disp: 300 each, Rfl: 0   glucose blood test strip, Use one strip to  test blood sugar three times daily, Disp: 300 strip, Rfl: 3   hydrochlorothiazide  (HYDRODIURIL ) 12.5 MG tablet, Take 1 tablet (12.5 mg total) by mouth daily., Disp: 90 tablet, Rfl: 2   ibuprofen  (ADVIL ) 800 MG tablet, Take 1 tablet (800 mg total) by mouth every 8 (eight) hours as needed., Disp: 30 tablet, Rfl: 0   insulin  regular (HUMULIN  R) 100 units/mL injection, Inject 20 Units total into the skin 3 (three) times daily before meals., Disp: 60 mL, Rfl: 0   Insulin  Syringe-Needle U-100 (TECHLITE INSULIN  SYRINGE) 31G X 15/64 0.3 ML MISC, Use as directed 4 times a day to inject insulin , Disp: 400 each, Rfl: 3   Insulin  Syringe-Needle U-100 (TECHLITE INSULIN  SYRINGE) 31G X 15/64 0.3 ML MISC, Use to inject insulin  4 times a day as directed., Disp: 400 each, Rfl: 4   ketoconazole  (NIZORAL ) 2 % cream, Apply 1 Application topically daily., Disp: 30 g, Rfl: 0   Lancets (FREESTYLE) lancets, Test blood sugars up to 3 times daily, Disp: 300 each, Rfl: 3   lisinopril  (ZESTRIL ) 10 MG tablet, Take 1 tablet (10 mg total) by mouth daily., Disp: 90 tablet, Rfl: 4   metFORMIN  (GLUCOPHAGE ) 1000 MG tablet, Take 1 tablet (1,000 mg total) by mouth 2 (two) times daily with a meal., Disp: 180 tablet, Rfl: 4   nystatin  (MYCOSTATIN ) 100000 UNIT/ML suspension, Take 5 mLs (500,000 Units total) by mouth 4 (four) times daily. Put on file, Disp: 473 mL, Rfl: 1   ondansetron  (ZOFRAN -ODT) 4 MG disintegrating tablet, Dissolve 1 tablet (4 mg total) by mouth every 8 (eight) hours as needed for nausea or vomiting., Disp: 20 tablet, Rfl: 0   pantoprazole  (PROTONIX ) 40 MG tablet, Take 1 tablet (40 mg) by mouth 2 times daily before a meal., Disp: 180 tablet, Rfl: 3   Prenatal Vit-Fe Fumarate-FA (PRENATAL VITAMIN PO), Take 1 tablet by mouth daily., Disp: , Rfl:    tirzepatide  (MOUNJARO ) 15 MG/0.5ML Pen, Inject 15 mg into the skin once a week., Disp: 6 mL, Rfl: 4

## 2024-04-27 NOTE — Telephone Encounter (Signed)
 Will discuss this during our video visit today

## 2024-04-30 ENCOUNTER — Ambulatory Visit: Admitting: Family Medicine

## 2024-05-04 ENCOUNTER — Encounter: Payer: Self-pay | Admitting: Family Medicine

## 2024-05-04 ENCOUNTER — Other Ambulatory Visit: Payer: Self-pay

## 2024-05-04 DIAGNOSIS — L438 Other lichen planus: Secondary | ICD-10-CM

## 2024-05-04 MED ORDER — CLOBETASOL PROPIONATE 0.05 % EX OINT: TOPICAL_OINTMENT | CUTANEOUS | 0 refills | Status: AC

## 2024-05-04 MED ORDER — CLOBETASOL PROPIONATE 0.05 % EX GEL
CUTANEOUS | 0 refills | Status: DC
  Filled 2024-05-04: qty 60, 30d supply, fill #0

## 2024-05-04 MED ORDER — CLOBETASOL PROPIONATE 0.05 % EX GEL: CUTANEOUS | 0 refills | Status: DC

## 2024-05-04 NOTE — Addendum Note (Signed)
 Addended by: JOLINDA NORENE HERO on: 05/04/2024 04:16 PM   Modules accepted: Orders

## 2024-05-04 NOTE — Addendum Note (Signed)
 Addended by: JOLINDA NORENE HERO on: 05/04/2024 10:59 AM   Modules accepted: Orders

## 2024-05-07 ENCOUNTER — Other Ambulatory Visit: Payer: Self-pay | Admitting: Family Medicine

## 2024-05-07 ENCOUNTER — Other Ambulatory Visit (HOSPITAL_BASED_OUTPATIENT_CLINIC_OR_DEPARTMENT_OTHER): Payer: Self-pay

## 2024-05-07 ENCOUNTER — Other Ambulatory Visit (HOSPITAL_COMMUNITY): Payer: Self-pay

## 2024-05-07 ENCOUNTER — Telehealth: Payer: Self-pay | Admitting: Pharmacy Technician

## 2024-05-07 DIAGNOSIS — I152 Hypertension secondary to endocrine disorders: Secondary | ICD-10-CM

## 2024-05-07 NOTE — Telephone Encounter (Signed)
 Pharmacy Patient Advocate Encounter  Received notification from [facility] that Prior Authorization for  Mounjaro  15MG /0.5ML auto-injectors has been APPROVED from 05/07/2024 to 05/08/2027. Ran test claim, Copay is $25.00. This test claim was processed through [facility]- copay amounts may vary at other pharmacies due to pharmacy/plan contracts, or as the patient moves through the different stages of their insurance plan.   PA #/Case ID/Reference #: [id number]

## 2024-05-07 NOTE — Telephone Encounter (Signed)
 Pharmacy Patient Advocate Encounter   Received notification from [employer] that prior authorization for Mounjaro  15MG /0.5ML auto-injectors is required/requested.   Insurance verification completed.   The patient is insured through [facility].   Per test claim: PA required; PA submitted to above mentioned insurance via Latent Key/confirmation #/EOC [id number] Status is pending

## 2024-05-08 ENCOUNTER — Other Ambulatory Visit (HOSPITAL_BASED_OUTPATIENT_CLINIC_OR_DEPARTMENT_OTHER): Payer: Self-pay

## 2024-05-09 ENCOUNTER — Other Ambulatory Visit (HOSPITAL_BASED_OUTPATIENT_CLINIC_OR_DEPARTMENT_OTHER): Payer: Self-pay

## 2024-05-10 ENCOUNTER — Other Ambulatory Visit (HOSPITAL_BASED_OUTPATIENT_CLINIC_OR_DEPARTMENT_OTHER): Payer: Self-pay

## 2024-05-10 ENCOUNTER — Other Ambulatory Visit: Payer: Self-pay | Admitting: Family Medicine

## 2024-05-10 DIAGNOSIS — E1159 Type 2 diabetes mellitus with other circulatory complications: Secondary | ICD-10-CM

## 2024-05-10 MED ORDER — LISINOPRIL 10 MG PO TABS
10.0000 mg | ORAL_TABLET | Freq: Every day | ORAL | 3 refills | Status: AC
  Filled 2024-05-10: qty 90, 90d supply, fill #0

## 2024-05-10 NOTE — Telephone Encounter (Signed)
 Uses [facility], not [facility], sent remaining refills in

## 2024-06-18 ENCOUNTER — Ambulatory Visit (INDEPENDENT_AMBULATORY_CARE_PROVIDER_SITE_OTHER): Admitting: Gastroenterology

## 2024-06-29 ENCOUNTER — Ambulatory Visit: Admitting: Family Medicine
